# Patient Record
Sex: Female | Born: 1960 | Race: Black or African American | Hispanic: No | Marital: Single | Smoking: Former smoker
Health system: Southern US, Community
[De-identification: ages and names within clinical notes are randomized; demographics above are authoritative.]

## PROBLEM LIST (undated history)

## (undated) DIAGNOSIS — F419 Anxiety disorder, unspecified: Secondary | ICD-10-CM

## (undated) DIAGNOSIS — F329 Major depressive disorder, single episode, unspecified: Secondary | ICD-10-CM

## (undated) DIAGNOSIS — L309 Dermatitis, unspecified: Secondary | ICD-10-CM

## (undated) DIAGNOSIS — E785 Hyperlipidemia, unspecified: Secondary | ICD-10-CM

## (undated) DIAGNOSIS — R87810 Cervical high risk human papillomavirus (HPV) DNA test positive: Secondary | ICD-10-CM

## (undated) DIAGNOSIS — T7840XA Allergy, unspecified, initial encounter: Secondary | ICD-10-CM

## (undated) DIAGNOSIS — N898 Other specified noninflammatory disorders of vagina: Principal | ICD-10-CM

## (undated) DIAGNOSIS — I10 Essential (primary) hypertension: Secondary | ICD-10-CM

## (undated) DIAGNOSIS — B379 Candidiasis, unspecified: Secondary | ICD-10-CM

## (undated) DIAGNOSIS — F70 Mild intellectual disabilities: Secondary | ICD-10-CM

## (undated) DIAGNOSIS — K219 Gastro-esophageal reflux disease without esophagitis: Secondary | ICD-10-CM

## (undated) DIAGNOSIS — N950 Postmenopausal bleeding: Principal | ICD-10-CM

## (undated) DIAGNOSIS — N951 Menopausal and female climacteric states: Secondary | ICD-10-CM

## (undated) DIAGNOSIS — L509 Urticaria, unspecified: Secondary | ICD-10-CM

## (undated) DIAGNOSIS — R232 Flushing: Principal | ICD-10-CM

## (undated) DIAGNOSIS — F32A Depression, unspecified: Secondary | ICD-10-CM

## (undated) HISTORY — DX: Candidiasis, unspecified: B37.9

## (undated) HISTORY — DX: Major depressive disorder, single episode, unspecified: F32.9

## (undated) HISTORY — DX: Cervical high risk human papillomavirus (HPV) DNA test positive: R87.810

## (undated) HISTORY — DX: Flushing: R23.2

## (undated) HISTORY — DX: Dermatitis, unspecified: L30.9

## (undated) HISTORY — DX: Hyperlipidemia, unspecified: E78.5

## (undated) HISTORY — DX: Anxiety disorder, unspecified: F41.9

## (undated) HISTORY — DX: Postmenopausal bleeding: N95.0

## (undated) HISTORY — PX: NO PAST SURGERIES: SHX2092

## (undated) HISTORY — DX: Gastro-esophageal reflux disease without esophagitis: K21.9

## (undated) HISTORY — DX: Depression, unspecified: F32.A

## (undated) HISTORY — DX: Urticaria, unspecified: L50.9

## (undated) HISTORY — DX: Menopausal and female climacteric states: N95.1

## (undated) HISTORY — DX: Allergy, unspecified, initial encounter: T78.40XA

## (undated) HISTORY — DX: Other specified noninflammatory disorders of vagina: N89.8

---

## 2003-02-23 ENCOUNTER — Ambulatory Visit (HOSPITAL_COMMUNITY): Admission: RE | Admit: 2003-02-23 | Discharge: 2003-02-23 | Payer: Self-pay | Admitting: Family Medicine

## 2003-02-23 ENCOUNTER — Encounter: Payer: Self-pay | Admitting: Family Medicine

## 2004-11-28 ENCOUNTER — Ambulatory Visit: Payer: Self-pay | Admitting: Family Medicine

## 2004-12-19 ENCOUNTER — Ambulatory Visit: Payer: Self-pay | Admitting: Family Medicine

## 2004-12-20 ENCOUNTER — Ambulatory Visit (HOSPITAL_COMMUNITY): Admission: RE | Admit: 2004-12-20 | Discharge: 2004-12-20 | Payer: Self-pay | Admitting: Family Medicine

## 2005-03-14 ENCOUNTER — Ambulatory Visit (HOSPITAL_COMMUNITY): Admission: RE | Admit: 2005-03-14 | Discharge: 2005-03-14 | Payer: Self-pay | Admitting: Specialist

## 2005-03-16 ENCOUNTER — Ambulatory Visit: Payer: Self-pay | Admitting: Family Medicine

## 2005-06-19 ENCOUNTER — Ambulatory Visit: Payer: Self-pay | Admitting: Family Medicine

## 2005-08-20 ENCOUNTER — Ambulatory Visit: Payer: Self-pay | Admitting: Family Medicine

## 2005-10-16 ENCOUNTER — Ambulatory Visit: Payer: Self-pay | Admitting: Family Medicine

## 2005-12-25 ENCOUNTER — Ambulatory Visit: Payer: Self-pay | Admitting: Family Medicine

## 2006-03-26 ENCOUNTER — Ambulatory Visit (HOSPITAL_COMMUNITY): Admission: RE | Admit: 2006-03-26 | Discharge: 2006-03-26 | Payer: Self-pay | Admitting: Family Medicine

## 2006-04-29 ENCOUNTER — Ambulatory Visit: Payer: Self-pay | Admitting: Family Medicine

## 2006-07-31 ENCOUNTER — Ambulatory Visit: Payer: Self-pay | Admitting: Family Medicine

## 2007-05-01 ENCOUNTER — Ambulatory Visit (HOSPITAL_COMMUNITY): Admission: RE | Admit: 2007-05-01 | Discharge: 2007-05-01 | Payer: Self-pay | Admitting: Family Medicine

## 2007-08-18 ENCOUNTER — Ambulatory Visit: Payer: Self-pay | Admitting: Family Medicine

## 2007-09-30 ENCOUNTER — Ambulatory Visit: Payer: Self-pay | Admitting: Family Medicine

## 2007-10-09 ENCOUNTER — Encounter: Payer: Self-pay | Admitting: Family Medicine

## 2007-12-04 ENCOUNTER — Ambulatory Visit: Payer: Self-pay | Admitting: Family Medicine

## 2008-01-22 ENCOUNTER — Ambulatory Visit: Payer: Self-pay | Admitting: Family Medicine

## 2008-04-26 ENCOUNTER — Ambulatory Visit: Payer: Self-pay | Admitting: Family Medicine

## 2008-08-27 ENCOUNTER — Ambulatory Visit: Payer: Self-pay | Admitting: Family Medicine

## 2008-11-18 ENCOUNTER — Ambulatory Visit: Payer: Self-pay | Admitting: Family Medicine

## 2008-11-18 ENCOUNTER — Other Ambulatory Visit: Admission: RE | Admit: 2008-11-18 | Discharge: 2008-11-18 | Payer: Self-pay | Admitting: Family Medicine

## 2008-11-18 ENCOUNTER — Encounter: Payer: Self-pay | Admitting: Family Medicine

## 2009-02-15 ENCOUNTER — Ambulatory Visit: Payer: Self-pay | Admitting: Family Medicine

## 2009-03-29 ENCOUNTER — Ambulatory Visit: Payer: Self-pay | Admitting: Family Medicine

## 2009-05-12 ENCOUNTER — Ambulatory Visit: Payer: Self-pay | Admitting: Family Medicine

## 2009-06-06 ENCOUNTER — Ambulatory Visit (HOSPITAL_COMMUNITY): Admission: RE | Admit: 2009-06-06 | Discharge: 2009-06-06 | Payer: Self-pay | Admitting: Family Medicine

## 2009-06-27 ENCOUNTER — Other Ambulatory Visit: Admission: RE | Admit: 2009-06-27 | Discharge: 2009-06-27 | Payer: Self-pay | Admitting: Obstetrics and Gynecology

## 2009-08-29 ENCOUNTER — Ambulatory Visit: Payer: Self-pay | Admitting: Family Medicine

## 2009-11-21 ENCOUNTER — Ambulatory Visit: Payer: Self-pay | Admitting: Family Medicine

## 2010-01-24 ENCOUNTER — Encounter: Payer: Self-pay | Admitting: Family Medicine

## 2010-02-02 ENCOUNTER — Encounter: Payer: Self-pay | Admitting: Physician Assistant

## 2010-02-02 ENCOUNTER — Telehealth: Payer: Self-pay | Admitting: Family Medicine

## 2010-02-02 ENCOUNTER — Ambulatory Visit: Payer: Self-pay | Admitting: Family Medicine

## 2010-02-02 DIAGNOSIS — J019 Acute sinusitis, unspecified: Secondary | ICD-10-CM

## 2010-02-10 ENCOUNTER — Telehealth: Payer: Self-pay | Admitting: Family Medicine

## 2010-02-13 ENCOUNTER — Ambulatory Visit: Payer: Self-pay | Admitting: Family Medicine

## 2010-02-13 ENCOUNTER — Telehealth (INDEPENDENT_AMBULATORY_CARE_PROVIDER_SITE_OTHER): Payer: Self-pay | Admitting: *Deleted

## 2010-02-13 DIAGNOSIS — K219 Gastro-esophageal reflux disease without esophagitis: Secondary | ICD-10-CM

## 2010-04-18 ENCOUNTER — Ambulatory Visit: Payer: Self-pay | Admitting: Family Medicine

## 2010-04-18 DIAGNOSIS — R5381 Other malaise: Secondary | ICD-10-CM | POA: Insufficient documentation

## 2010-04-18 DIAGNOSIS — R5383 Other fatigue: Secondary | ICD-10-CM

## 2010-04-18 DIAGNOSIS — R7301 Impaired fasting glucose: Secondary | ICD-10-CM | POA: Insufficient documentation

## 2010-04-18 DIAGNOSIS — E785 Hyperlipidemia, unspecified: Secondary | ICD-10-CM

## 2010-04-18 DIAGNOSIS — E8881 Metabolic syndrome: Secondary | ICD-10-CM

## 2010-06-06 ENCOUNTER — Telehealth: Payer: Self-pay | Admitting: Family Medicine

## 2010-06-21 ENCOUNTER — Ambulatory Visit: Payer: Self-pay | Admitting: Family Medicine

## 2010-06-29 ENCOUNTER — Telehealth: Payer: Self-pay | Admitting: Family Medicine

## 2010-06-29 LAB — CONVERTED CEMR LAB
AST: 16 units/L (ref 0–37)
Albumin: 4.2 g/dL (ref 3.5–5.2)
Alkaline Phosphatase: 83 units/L (ref 39–117)
Calcium: 9.5 mg/dL (ref 8.4–10.5)
Chloride: 107 meq/L (ref 96–112)
Creatinine, Ser: 0.85 mg/dL (ref 0.40–1.20)
HDL: 44 mg/dL (ref >39)
Hgb A1c MFr Bld: 5.5 % (ref <5.7)
Sodium: 138 meq/L (ref 135–145)
Total CHOL/HDL Ratio: 3
Total Protein: 7.4 g/dL (ref 6.0–8.3)
Triglycerides: 151 mg/dL — ABNORMAL HIGH (ref <150)

## 2010-07-06 ENCOUNTER — Other Ambulatory Visit: Admission: RE | Admit: 2010-07-06 | Discharge: 2010-07-06 | Payer: Self-pay | Admitting: Obstetrics and Gynecology

## 2010-07-06 ENCOUNTER — Encounter: Payer: Self-pay | Admitting: Family Medicine

## 2010-08-10 ENCOUNTER — Telehealth: Payer: Self-pay | Admitting: Family Medicine

## 2010-08-22 ENCOUNTER — Telehealth: Payer: Self-pay | Admitting: Family Medicine

## 2010-08-24 ENCOUNTER — Ambulatory Visit: Payer: Self-pay | Admitting: Family Medicine

## 2010-08-24 ENCOUNTER — Telehealth: Payer: Self-pay | Admitting: Family Medicine

## 2010-08-24 DIAGNOSIS — L259 Unspecified contact dermatitis, unspecified cause: Secondary | ICD-10-CM | POA: Insufficient documentation

## 2010-08-29 ENCOUNTER — Telehealth: Payer: Self-pay | Admitting: Family Medicine

## 2010-08-30 LAB — CONVERTED CEMR LAB
Basophils Absolute: 0 10*3/uL (ref 0.0–0.1)
Basophils Relative: 0 % (ref 0–1)
Hgb A1c MFr Bld: 5.4 % (ref <5.7)
MCHC: 32.7 g/dL (ref 30.0–36.0)
Monocytes Absolute: 0.4 10*3/uL (ref 0.1–1.0)
Neutro Abs: 4.3 10*3/uL (ref 1.7–7.7)
Neutrophils Relative %: 55 % (ref 43–77)
Platelets: 288 10*3/uL (ref 150–400)
RDW: 14.9 % (ref 11.5–15.5)
TSH: 1.598 microintl units/mL (ref 0.350–4.500)

## 2010-09-13 ENCOUNTER — Telehealth: Payer: Self-pay | Admitting: Family Medicine

## 2010-09-15 ENCOUNTER — Telehealth: Payer: Self-pay | Admitting: Family Medicine

## 2010-09-18 ENCOUNTER — Telehealth (INDEPENDENT_AMBULATORY_CARE_PROVIDER_SITE_OTHER): Payer: Self-pay | Admitting: *Deleted

## 2010-09-19 ENCOUNTER — Telehealth: Payer: Self-pay | Admitting: Family Medicine

## 2010-09-25 ENCOUNTER — Telehealth: Payer: Self-pay | Admitting: Family Medicine

## 2010-09-26 ENCOUNTER — Encounter: Payer: Self-pay | Admitting: Family Medicine

## 2010-09-26 ENCOUNTER — Telehealth (INDEPENDENT_AMBULATORY_CARE_PROVIDER_SITE_OTHER): Payer: Self-pay | Admitting: *Deleted

## 2010-09-27 ENCOUNTER — Ambulatory Visit: Payer: Self-pay | Admitting: Family Medicine

## 2010-10-10 ENCOUNTER — Telehealth: Payer: Self-pay | Admitting: Family Medicine

## 2010-10-11 ENCOUNTER — Telehealth: Payer: Self-pay | Admitting: Family Medicine

## 2010-10-12 ENCOUNTER — Telehealth: Payer: Self-pay | Admitting: Family Medicine

## 2010-10-16 ENCOUNTER — Ambulatory Visit (HOSPITAL_COMMUNITY): Admission: RE | Admit: 2010-10-16 | Payer: Self-pay | Source: Home / Self Care | Admitting: Family Medicine

## 2010-10-16 ENCOUNTER — Telehealth: Payer: Self-pay | Admitting: Family Medicine

## 2010-10-23 ENCOUNTER — Telehealth: Payer: Self-pay | Admitting: Family Medicine

## 2010-10-28 ENCOUNTER — Encounter: Payer: Self-pay | Admitting: Family Medicine

## 2010-10-29 ENCOUNTER — Encounter: Payer: Self-pay | Admitting: Family Medicine

## 2010-10-30 ENCOUNTER — Telehealth: Payer: Self-pay | Admitting: Family Medicine

## 2010-11-07 NOTE — Assessment & Plan Note (Signed)
Summary: office visit   Vital Signs:  Patient profile:   50 year old female Height:      66 inches Weight:      278.75 pounds BMI:     45.15 O2 Sat:      94 % Pulse rate:   100 / minute Pulse rhythm:   regular Resp:     16 per minute BP sitting:   110 / 78  (left arm) Cuff size:   xl  Vitals Entered By: Everitt Amber LPN (April 18, 2010 9:09 AM)  Nutrition Counseling: Patient's BMI is greater than 25 and therefore counseled on weight management options. CC: Follow up chronic problems   CC:  Follow up chronic problems.  History of Present Illness: Reports  that she has not been doing very well. She states that she is feeling "pressured " by her family about her eating, she is not able to do the right thing under pressure, and is ditressed aboput her significant weight gain. Denies recent fever or chills. Denies sinus pressure, nasal congestion , ear pain or sore throat. Denies chest congestion, or cough productive of sputum. Denies chest pain, palpitations, PND, orthopnea or leg swelling. Denies abdominal pain, nausea, vomitting, diarrhea or constipation. Denies change in bowel movements or bloody stool. Denies dysuria , frequency, incontinence or hesitancy. Denies  joint pain, swelling, or reduced mobility. Denies headaches, vertigo, seizures. reports increased depression and anxiety.She denies suicidal or homicidal thoughts, and denies hallucinations.  Denies  rash, lesions, or itch.     Current Medications (verified): 1)  Omeprazole 20 Mg Cpdr (Omeprazole) .... Take 1 Daily 2)  Pravastatin Sodium 20 Mg Tabs (Pravastatin Sodium) .... Take 1 Tab By Mouth At Bedtime 3)  Alprazolam 1 Mg Tabs (Alprazolam) .... Take 1 Tablet By Mouth Two Times A Day 4)  Cetirizine Hcl 10 Mg Tabs (Cetirizine Hcl) .... Take 1 Tablet By Mouth Once A Day 5)  Seroquel Xr 300 Mg Xr24h-Tab (Quetiapine Fumarate) .... Take 1 Tablet By Mouth Once A Day 6)  Cyproheptadine Hcl 4 Mg Tabs (Cyproheptadine  Hcl) .... Take 1 Tablet By Mouth Once A Day 7)  Paroxetine Hcl 40 Mg Tabs (Paroxetine Hcl) .... Take 1 Tablet By Mouth Every Morning 8)  Ortho Tri-Cyclen Lo 0.18/0.215/0.25 Mg-25 Mcg Tabs (Norgestim-Eth Estrad Triphasic) .... Take 1 Tablet By Mouth Once A Day 9)  Loratadine 10 Mg Tabs (Loratadine) .... Take 1 Tablet By Mouth Once A Day  Allergies (verified): No Known Drug Allergies  Review of Systems      See HPI Eyes:  Denies blurring and discharge. Endo:  Complains of excessive hunger; denies excessive urination, heat intolerance, and polyuria. Heme:  Denies abnormal bruising and bleeding. Allergy:  Denies hives or rash and itching eyes.  Physical Exam  General:  Well-developed,obese,in no acute distress; alert,appropriate and cooperative throughout examination HEENT: No facial asymmetry,  EOMI, No sinus tenderness, TM's Clear, oropharynx  pink and moist.   Chest: Clear to auscultation bilaterally.  CVS: S1, S2, No murmurs, No S3.   Abd: Soft, Nontender.  MS: Adequate ROM spine, hips, shoulders and knees.  Ext: No edema.   CNS: CN 2-12 intact, power tone and sensation normal throughout.   Skin: Intact, no visible lesions or rashes.  Psych: Good eye contact, normal affect.  Memory intact, anxious  appearing.    Impression & Recommendations:  Problem # 1:  OBESITY (ICD-278.00) Assessment Deteriorated pt advised to commit to regular exrcise , and to reduce high carb snacks,  a letter was also sent to her family  Problem # 2:  HYPERLIPIDEMIA (ICD-272.4) Assessment: Comment Only  Her updated medication list for this problem includes:    Pravastatin Sodium 20 Mg Tabs (Pravastatin sodium) .Marland Kitchen... Take 1 tab by mouth at bedtime  Orders: T-Hepatic Function [phone]) T-Lipid Profile [phone])  lab to be reptsd when due  Problem # 3:  GERD (ICD-530.81) Assessment: Improved  Her updated medication list for this problem includes:    Omeprazole 20 Mg Cpdr (Omeprazole)  .Marland Kitchen... Take 1 daily  Complete Medication List: 1)  Omeprazole 20 Mg Cpdr (Omeprazole) .... Take 1 daily 2)  Pravastatin Sodium 20 Mg Tabs (Pravastatin sodium) .... Take 1 tab by mouth at bedtime 3)  Alprazolam 1 Mg Tabs (Alprazolam) .... Take 1 tablet by mouth two times a day 4)  Cetirizine Hcl 10 Mg Tabs (Cetirizine hcl) .... Take 1 tablet by mouth once a day 5)  Seroquel Xr 300 Mg Xr24h-tab (Quetiapine fumarate) .... Take 1 tablet by mouth once a day 6)  Cyproheptadine Hcl 4 Mg Tabs (Cyproheptadine hcl) .... Take 1 tablet by mouth once a day 7)  Paroxetine Hcl 40 Mg Tabs (Paroxetine hcl) .... Take 1 tablet by mouth every morning 8)  Ortho Tri-cyclen Lo 0.18/0.215/0.25 Mg-25 Mcg Tabs (Norgestim-eth estrad triphasic) .... Take 1 tablet by mouth once a day 9)  Loratadine 10 Mg Tabs (Loratadine) .... Take 1 tablet by mouth once a day  Other Orders: T-Basic Metabolic Panel [id number]) T- Hemoglobin A1C ([id number])  Patient Instructions: 1)  Please schedule a follow-up appointment in 2 months. 2)  It is important that you exercise regularly at least30 minutes 5 times a week. If you develop chest pain, have severe difficulty breathing, or feel very tired , stop exercising immediately and seek medical attention. 3)  You need to lose weight. Consider a lower calorie diet and regular exercise.  4)  BMP prior to visit, ICD-9: 5)  Hepatic Panel prior to visit, ICD-9:  fasting in 2 months 6)  Lipid Panel prior to visit, ICD-9: 7)  HbgA1C prior to visit, ICD-9:

## 2010-11-07 NOTE — Letter (Signed)
Summary: family tree  family tree   Imported By: Lind Guest 07/12/2010 12:56:06  _____________________________________________________________________  External Attachment:    Type:   Image     Comment:   External Document

## 2010-11-07 NOTE — Letter (Signed)
Summary: office notes  office notes   Imported By: Lind Guest 03/10/2010 16:54:32  _____________________________________________________________________  External Attachment:    Type:   Image     Comment:   External Document

## 2010-11-07 NOTE — Assessment & Plan Note (Signed)
Summary: rash per phone note patient to come in/slj   Vital Signs:  Patient profile:   50 year old female Height:      66 inches Weight:      240 pounds O2 Sat:      97 % on Room air Pulse rate:   93 / minute Pulse rhythm:   regular Resp:     16 per minute BP sitting:   122 / 84  (left arm)  O2 Flow:  Room air CC: rasg   CC:  rasg.  History of Present Illness: Reports  that tsh has been doing well , except for a generalised rash which she specifically would like treated. She has been diligent with both diet and reduced intake with resultant weight loss Denies sinus pressure, nasal congestion , ear pain or sore throat. Denies chest congestion, or cough productive of sputum. Denies chest pain, palpitations, PND, orthopnea or leg swelling. Denies abdominal pain, nausea, vomitting, diarrhea or constipation. Denies change in bowel movements or bloody stool. Denies dysuria , frequency, incontinence or hesitancy. Denies  joint pain, swelling, or reduced mobility. Denies headaches, vertigo, seizures. Denies depression, anxiety or insomnia.      Allergies: No Known Drug Allergies  Review of Systems      See HPI General:  Complains of weight loss; intentionmalthrough lifestyle change and med asistance. Eyes:  Denies blurring and discharge. Psych:  Complains of anxiety and mental problems; denies depression and suicidal thoughts/plans. Endo:  Denies cold intolerance, excessive hunger, excessive thirst, and excessive urination. Heme:  Denies abnormal bruising and bleeding. Allergy:  Complains of hives or rash and seasonal allergies.  Physical Exam  General:  Well-developedobese,in no acute distress; alert,appropriate and cooperative throughout examination HEENT: No facial asymmetry,  EOMI, No sinus tenderness, TM's Clear, oropharynx  pink and moist.   Chest: Clear to auscultation bilaterally.  CVS: S1, S2, No murmurs, No S3.   Abd: Soft, Nontender.  MS: Adequate ROM spine,  hips, shoulders and knees.  Ext: No edema.   CNS: CN 2-12 intact, power tone and sensation normal throughout.   Skin:maculopapular rash on upper extremeties and trunk Psych: Good eye contact, normal affect.  Memory intact, not anxious or depressed appearing.    Impression & Recommendations:  Problem # 1:  DERMATITIS, ALLERGIC (ICD-692.9) Assessment Deteriorated  Her updated medication list for this problem includes:    Cetirizine Hcl 10 Mg Tabs (Cetirizine hcl) .Marland Kitchen... Take 1 tablet by mouth once a day    Cyproheptadine Hcl 4 Mg Tabs (Cyproheptadine hcl) .Marland Kitchen... Take 1 tablet by mouth once a day    Loratadine 10 Mg Tabs (Loratadine) .Marland Kitchen... Take 1 tablet by mouth once a day    Hydrocortisone 0.5 % Crea (Hydrocortisone) .Marland Kitchen... Apply sparingly to rash once daily as needed  Orders: Depo- Medrol 80mg  (J1040) Admin of Therapeutic Inj  intramuscular or subcutaneous (16109)  Problem # 2:  OBESITY (ICD-278.00) Assessment: Improved  Ht: 66 (08/24/2010)   Wt: 240 (08/24/2010)   BMI: 42.97 (06/21/2010) therapeutic lifestyle change discussed and encouraged  Problem # 3:  HYPERLIPIDEMIA (ICD-272.4) Assessment: Comment Only  Her updated medication list for this problem includes:    Pravastatin Sodium 20 Mg Tabs (Pravastatin sodium) .Marland Kitchen... Take 1 tab by mouth at bedtime  Labs Reviewed: SGOT: 16 (06/21/2010)   SGPT: 11 (06/21/2010)   HDL:44 (06/21/2010)  LDL:58 (06/21/2010)  Chol:132 (06/21/2010)  Trig:151 (06/21/2010)  Complete Medication List: 1)  Omeprazole 20 Mg Cpdr (Omeprazole) .... Take 1 daily 2)  Pravastatin Sodium 20 Mg Tabs (Pravastatin sodium) .... Take 1 tab by mouth at bedtime 3)  Alprazolam 1 Mg Tabs (Alprazolam) .... Take 1 tablet by mouth two times a day 4)  Cetirizine Hcl 10 Mg Tabs (Cetirizine hcl) .... Take 1 tablet by mouth once a day 5)  Cyproheptadine Hcl 4 Mg Tabs (Cyproheptadine hcl) .... Take 1 tablet by mouth once a day 6)  Paroxetine Hcl 40 Mg Tabs (Paroxetine hcl)  .... Take 1 tablet by mouth every morning 7)  Ortho Tri-cyclen Lo 0.18/0.215/0.25 Mg-25 Mcg Tabs (Norgestim-eth estrad triphasic) .... Take 1 tablet by mouth once a day 8)  Loratadine 10 Mg Tabs (Loratadine) .... Take 1 tablet by mouth once a day 9)  Phentermine Hcl 37.5 Mg Tabs (Phentermine hcl) .... Take one half  tablet by mouth once a day 10)  Hydrocortisone 0.5 % Crea (Hydrocortisone) .... Apply sparingly to rash once daily as needed Prescriptions: PAROXETINE HCL 40 MG TABS (PAROXETINE HCL) Take 1 tablet by mouth every morning  #30 x 3   Entered by:   Adella Hare LPN   Authorized by:   Syliva Overman MD   Signed by:   Adella Hare LPN on 16/07/9603   Method used:   Electronically to        [facility]* (retail)       [address] [place], [place]  [address]       Ph: [phone]       Fax: [phone]   RxID:   [id number] CYPROHEPTADINE HCL 4 MG TABS (CYPROHEPTADINE HCL) Take 1 tablet by mouth once a day  #30 x 3   Entered by:   Adella Hare LPN   Authorized by:   Syliva Overman MD   Signed by:   Adella Hare LPN on 08/04/2535   Method used:   Electronically to        [facility]* (retail)       [address] [place], [place]  [address]       Ph: [phone]       Fax: [phone]   RxID:   [id number] CETIRIZINE HCL 10 MG TABS (CETIRIZINE HCL) Take 1 tablet by mouth once a day  #30 x 3   Entered by:   Adella Hare LPN   Authorized by:   Syliva Overman MD   Signed by:   Adella Hare LPN on 32/35/5732   Method used:   Electronically to        [facility]* (retail)       [address]       [place], [place]  [address]       Ph: [phone]       Fax: [phone]   RxID:   [id number] PRAVASTATIN SODIUM 20 MG TABS (PRAVASTATIN SODIUM) Take 1 tab by mouth at bedtime  #30 x 3   Entered by:   Adella Hare LPN   Authorized by:   Syliva Overman MD   Signed by:   Adella Hare LPN on 35/00/9381   Method used:   Electronically to        [facility]* (retail)       [address]  [place], [place]  [address]       Ph: [phone]       Fax: [phone]   RxID:   [id number] OMEPRAZOLE 20 MG CPDR (OMEPRAZOLE) take 1 daily  #30 x 3   Entered by:   Adella Hare LPN   Authorized by:   Syliva Overman MD   Signed by:   Adella Hare LPN on 40/07/2724   Method used:   Electronically to        [facility]* (retail)       [address]       [place], [place]  [address]       Ph: [phone]       Fax: [phone]   RxID:   [id number]    Medication Administration  Injection # 1:    Medication: Depo- Medrol 80mg     Diagnosis: DERMATITIS, ALLERGIC (ICD-692.9)    Route: IM    Site: R deltoid    Exp Date: 06/12    Lot #: [id number]    Mfr: Pharmacia    Patient tolerated injection without complications    Given by: Adella Hare LPN (August 24, 2010 11:24 AM)  Orders Added: 1)  Depo- Medrol 80mg  [J1040] 2)  Admin of Therapeutic Inj  intramuscular or subcutaneous [96372] 3)  Est. Patient Level IV [09323]     Medication Administration  Injection # 1:    Medication: Depo- Medrol 80mg     Diagnosis: DERMATITIS, ALLERGIC (ICD-692.9)    Route: IM    Site: R deltoid    Exp Date: 06/12    Lot #: [id number]    Mfr: Pharmacia    Patient tolerated injection without complications    Given by: Adella Hare LPN (August 24, 2010 11:24 AM)  Orders Added: 1)  Depo- Medrol 80mg  [J1040] 2)  Admin of Therapeutic Inj  intramuscular or subcutaneous [96372] 3)  Est. Patient Level IV [55732]

## 2010-11-07 NOTE — Progress Notes (Signed)
Summary: rx cough med  Phone Note Call from Patient   Summary of Call: coughing really bad and would like to get a rx for cough meds. [phone] Initial call taken by: Rudene Anda,  Feb 10, 2010 1:06 PM  Follow-up for Phone Call        cough with clear to yellow sputum advised robutussin otc  if develops fever or green sputum to go to er or urgent care Follow-up by: Adella Hare LPN,  Feb 10, 1609 4:16 PM

## 2010-11-07 NOTE — Progress Notes (Signed)
Summary: medicine  Phone Note Call from Patient   Summary of Call: said that [facility] does not have the medicine request Initial call taken by: Lind Guest,  February 02, 2010 1:16 PM  Follow-up for Phone Call        The med was entered but not signed. I sent the med to the pharmacy for her Follow-up by: Everitt Amber LPN,  February 02, 2010 1:39 PM

## 2010-11-07 NOTE — Progress Notes (Signed)
Summary: sweat  Phone Note Call from Patient   Summary of Call: patient called in states Dr. done blood work but she still  continue to sweat.. Initial call taken by: Eugenio Hoes,  September 13, 2010 11:16 AM  Follow-up for Phone Call        patient called back in and states that she called her pharmacy and they told her it might be coming from the medication Phentermen, he said that it had side effectB/P and heart rate and sweeting from this medication he advised patient to stop taking that medication.  alli, was over the counter medication he advised her of.   Follow-up by: Curtis Sites,  September 13, 2010 2:00 PM  Additional Follow-up for Phone Call Additional follow up Details #1::        let herknow the BP and the heart rate were good at [facility], she should try to stay off the phentermine if it is making her sweat excessively, if stopping helps her , then stop altogether, keep the diet and exercise going Additional Follow-up by: Syliva Overman MD,  September 14, 2010 12:58 PM    Additional Follow-up for Phone Call Additional follow up Details #2::    patient aware Follow-up by: Adella Hare LPN,  September 14, 2010 1:40 PM

## 2010-11-07 NOTE — Letter (Signed)
Summary: history and physical  history and physical   Imported By: Lind Guest 03/10/2010 16:55:27  _____________________________________________________________________  External Attachment:    Type:   Image     Comment:   External Document

## 2010-11-07 NOTE — Letter (Signed)
Summary: demo  demo   Imported By: Lind Guest 03/10/2010 16:56:55  _____________________________________________________________________  External Attachment:    Type:   Image     Comment:   External Document

## 2010-11-07 NOTE — Letter (Signed)
Summary: consults  consults   Imported By: Lind Guest 03/10/2010 16:52:50  _____________________________________________________________________  External Attachment:    Type:   Image     Comment:   External Document

## 2010-11-07 NOTE — Progress Notes (Signed)
Summary: diet pills  Phone Note Call from Patient   Complaint: Urinary/GYN Problems Summary of Call: pt would like to get soamething called into [facility] to curve her appetite. [phone] Initial call taken by: Rudene Anda,  June 06, 2010 11:56 AM  Follow-up for Phone Call        needs ov, also meds she is on cannot take apetite suppressants Follow-up by: Syliva Overman MD,  June 06, 2010 1:03 PM  Additional Follow-up for Phone Call Additional follow up Details #1::        patient aware Additional Follow-up by: Everitt Amber LPN,  June 06, 2010 1:19 PM

## 2010-11-07 NOTE — Progress Notes (Signed)
Summary: cough medicine  Phone Note Call from Patient   Summary of Call: wants some cough medicine called in at [facility].    to cut the cough off where it will dry it up she will stop coughing call her back at [phone] to let her know  Initial call taken by: Lind Guest,  Feb 13, 2010 8:52 AM  Follow-up for Phone Call        please have patient come in for office visit, already advised her friday if still coughing today would need appt Follow-up by: Adella Hare LPN,  Feb 13, 1609 8:56 AM  Additional Follow-up for Phone Call Additional follow up Details #1::        she has appt with dr. Lodema Hong on monday 5.16.11 Additional Follow-up by: Lind Guest,  Feb 13, 2010 9:40 AM

## 2010-11-07 NOTE — Progress Notes (Signed)
Summary: shot  Phone Note Call from Patient   Summary of Call: patient states her rash itches really bad, and she is putting some cream on it, she spoke to dr. Willa Rough nurse one day last week and they told her to ask for a shot.  she comes in on Thursday. Initial call taken by: Curtis Sites,  August 22, 2010 11:08 AM  Follow-up for Phone Call        advised patient we will see her thursday and may be able to give her shot at that time, advised to continue otc cream patient agrees Follow-up by: Adella Hare LPN,  August 22, 2010 11:14 AM

## 2010-11-07 NOTE — Letter (Signed)
Summary: phone notes  phone notes   Imported By: Lind Guest 03/13/2010 11:47:35  _____________________________________________________________________  External Attachment:    Type:   Image     Comment:   External Document

## 2010-11-07 NOTE — Letter (Signed)
Summary: x rays   x rays   Imported By: Lind Guest 03/10/2010 16:57:22  _____________________________________________________________________  External Attachment:    Type:   Image     Comment:   External Document

## 2010-11-07 NOTE — Progress Notes (Signed)
Summary: allergy and asthma  allergy and asthma   Imported By: Lind Guest 04/07/2010 14:38:52  _____________________________________________________________________  External Attachment:    Type:   Image     Comment:   External Document

## 2010-11-07 NOTE — Assessment & Plan Note (Signed)
Summary: office visit   Vital Signs:  Patient profile:   50 year old female Height:      66 inches Weight:      265.25 pounds BMI:     42.97 O2 Sat:      97 % Pulse rate:   106 / minute Pulse rhythm:   regular Resp:     16 per minute BP sitting:   118 / 82  (left arm) Cuff size:   xl  Vitals Entered By: Everitt Amber LPN (June 21, 2010 9:14 AM)  Nutrition Counseling: Patient's BMI is greater than 25 and therefore counseled on weight management options. CC: Follow up chronic problems   CC:  Follow up chronic problems.  History of Present Illness: Reports  that she has been doing well. She has stopped thwe seroquel is now on paxil and xanax. She has started walking and has changed her eating and has lost weight Denies recent fever or chills. Denies sinus pressure, nasal congestion , ear pain or sore throat. Denies chest congestion, or cough productive of sputum. Denies chest pain, palpitations, PND, orthopnea or leg swelling. Denies abdominal pain, nausea, vomitting, diarrhea or constipation. Denies change in bowel movements or bloody stool. Denies dysuria , frequency, incontinence or hesitancy. Denies  joint pain, swelling, or reduced mobility. Denies headaches, vertigo, seizures. Denies depression, anxiety or insomnia. Denies  rash, lesions, or itch. Pt has done extremely well with weight loss, and is again asking about suppressants, a call to the pharmacist verifies ok with her current meds. potential adverse s/E were discussed     Current Medications (verified): 1)  Omeprazole 20 Mg Cpdr (Omeprazole) .... Take 1 Daily 2)  Pravastatin Sodium 20 Mg Tabs (Pravastatin Sodium) .... Take 1 Tab By Mouth At Bedtime 3)  Alprazolam 1 Mg Tabs (Alprazolam) .... Take 1 Tablet By Mouth Two Times A Day 4)  Cetirizine Hcl 10 Mg Tabs (Cetirizine Hcl) .... Take 1 Tablet By Mouth Once A Day 5)  Cyproheptadine Hcl 4 Mg Tabs (Cyproheptadine Hcl) .... Take 1 Tablet By Mouth Once A  Day 6)  Paroxetine Hcl 40 Mg Tabs (Paroxetine Hcl) .... Take 1 Tablet By Mouth Every Morning 7)  Ortho Tri-Cyclen Lo 0.18/0.215/0.25 Mg-25 Mcg Tabs (Norgestim-Eth Estrad Triphasic) .... Take 1 Tablet By Mouth Once A Day 8)  Loratadine 10 Mg Tabs (Loratadine) .... Take 1 Tablet By Mouth Once A Day  Allergies (verified): No Known Drug Allergies  Review of Systems      See HPI Eyes:  Denies blurring and discharge. Endo:  Denies excessive thirst and excessive urination. Heme:  Denies abnormal bruising and bleeding. Allergy:  Denies hives or rash and itching eyes.  Physical Exam  General:  Well-developed,obese,in no acute distress; alert,appropriate and cooperative throughout examination HEENT: No facial asymmetry,  EOMI, No sinus tenderness, TM's Clear, oropharynx  pink and moist.   Chest: Clear to auscultation bilaterally.  CVS: S1, S2, No murmurs, No S3.   Abd: Soft, Nontender.  MS: Adequate ROM spine, hips, shoulders and knees.  Ext: No edema.   CNS: CN 2-12 intact, power tone and sensation normal throughout.   Skin: Intact, no visible lesions or rashes.  Psych: Good eye contact, normal affect.  Memory intact, not anxious or depressed appearing.    Impression & Recommendations:  Problem # 1:  OBESITY (ICD-278.00) Assessment Improved  Ht: 66 (06/21/2010)   Wt: 265.25 (06/21/2010)   BMI: 42.97 (06/21/2010) therapeutic lifestyle change discussed and encouraged  pt to start half  phentermine  Problem # 2:  HYPERLIPIDEMIA (ICD-272.4) Assessment: Comment Only  Her updated medication list for this problem includes:    Pravastatin Sodium 20 Mg Tabs (Pravastatin sodium) .Marland Kitchen... Take 1 tab by mouth at bedtime fasting labs to be checked  Complete Medication List: 1)  Omeprazole 20 Mg Cpdr (Omeprazole) .... Take 1 daily 2)  Pravastatin Sodium 20 Mg Tabs (Pravastatin sodium) .... Take 1 tab by mouth at bedtime 3)  Alprazolam 1 Mg Tabs (Alprazolam) .... Take 1 tablet by mouth two  times a day 4)  Cetirizine Hcl 10 Mg Tabs (Cetirizine hcl) .... Take 1 tablet by mouth once a day 5)  Cyproheptadine Hcl 4 Mg Tabs (Cyproheptadine hcl) .... Take 1 tablet by mouth once a day 6)  Paroxetine Hcl 40 Mg Tabs (Paroxetine hcl) .... Take 1 tablet by mouth every morning 7)  Ortho Tri-cyclen Lo 0.18/0.215/0.25 Mg-25 Mcg Tabs (Norgestim-eth estrad triphasic) .... Take 1 tablet by mouth once a day 8)  Loratadine 10 Mg Tabs (Loratadine) .... Take 1 tablet by mouth once a day 9)  Phentermine Hcl 37.5 Mg Tabs (Phentermine hcl) .... Take 1 tablet by mouth once a day (please  break in half and take in the morning)  Other Orders: Influenza Vaccine NON MCR (02774)  Patient Instructions: 1)  Please schedule a follow-up appointment in 2 months. 2)  It is important that you exercise regularly at least 20 minutes 5 times a week. If you develop chest pain, have severe difficulty breathing, or feel very tired , stop exercising immediately and seek medical attention. 3)  You need to lose weight. Consider a lower calorie diet and regular exercise.  4)  bREAK phentermine in half, and remeber if you develop any adverse side effects as discussed pls stop the med and call Prescriptions: PHENTERMINE HCL 37.5 MG TABS (PHENTERMINE HCL) Take 1 tablet by mouth once a day (please  break in half and take in the morning)  #30 x 0   Entered and Authorized by:   Syliva Overman MD   Signed by:   Syliva Overman MD on 06/21/2010   Method used:   Printed then faxed to ...         RxID:   [id number]    Influenza Vaccine    Vaccine Type: Fluvax Non-MCR    Site: right deltoid    Mfr: Novartis     Dose: 0.5 ml    Route: IM    Given by: Everitt Amber LPN    Exp. Date: 02/2011    Lot #: [id number]

## 2010-11-07 NOTE — Progress Notes (Signed)
Summary: rash  Phone Note Call from Patient   Summary of Call: rash arms and dr hicks is out and they wanted her  to call here see about getting a shot call back at [phone] Initial call taken by: Lind Guest,  August 10, 2010 11:59 AM  Follow-up for Phone Call        PATIENT HAS RASH ON HER ARMS X 1 WEEK, STATES ALLERGIC REACTION, DOESNT ITCH, NORMALLY SEES DR HICKS, SHE IS OUT OF OFFICE, PATIENT STATES SHE NEEDS A SHOT Follow-up by: Adella Hare LPN,  August 10, 2010 12:02 PM  Additional Follow-up for Phone Call Additional follow up Details #1::        needs appt next week here, I will need to see her , not an emergency, she may see dr Zandra Abts if she is available in the interim, pls let her know Additional Follow-up by: Syliva Overman MD,  August 10, 2010 12:34 PM    Additional Follow-up for Phone Call Additional follow up Details #2::    PATIENT AWARE Follow-up by: Lind Guest,  August 10, 2010 1:04 PM  thanks

## 2010-11-07 NOTE — Progress Notes (Signed)
       New/Updated Medications: PHENTERMINE HCL 37.5 MG TABS (PHENTERMINE HCL) Take one half  tablet by mouth once a day HYDROCORTISONE 0.5 % CREA (HYDROCORTISONE) apply sparingly to rash once daily as needed Prescriptions: HYDROCORTISONE 0.5 % CREA (HYDROCORTISONE) apply sparingly to rash once daily as needed  #45 gm x 0   Entered and Authorized by:   Syliva Overman MD   Signed by:   Syliva Overman MD on 08/24/2010   Method used:   Electronically to        [facility] (retail)       [address]       [place], [place]  [address]       Ph: [phone]       Fax: [phone]   RxID:   [id number] PHENTERMINE HCL 37.5 MG TABS (PHENTERMINE HCL) Take one half  tablet by mouth once a day  #30 x 0   Entered and Authorized by:   Syliva Overman MD   Signed by:   Syliva Overman MD on 08/24/2010   Method used:   Printed then faxed to ...       [facility] (retail)       [address]       [place], [place]  [address]       Ph: [phone]       Fax: [phone]   RxID:   [id number]

## 2010-11-07 NOTE — Progress Notes (Signed)
Summary: lab work  Phone Note Call from Patient   Summary of Call: PATIENT would like her lab work sent to [facility]. Initial call taken by: Curtis Sites,  August 29, 2010 3:06 PM  Follow-up for Phone Call        noted Follow-up by: Adella Hare LPN,  August 29, 2010 6:02 PM

## 2010-11-07 NOTE — Miscellaneous (Signed)
Summary: Orders Update  Clinical Lists Changes  Orders: Added new Test order of T-CBC w/Diff [phone]) - Signed Added new Test order of T-TSH [phone]) - Signed Added new Test order of T- Hemoglobin A1C ([id number]) - Signed

## 2010-11-07 NOTE — Progress Notes (Signed)
Summary: new allergy ashtma doctor  Phone Note Call from Patient   Summary of Call: patient would like to go to another allergy and asthma  doctor she wants a female she wants one here in [place] or [place], Doesn't want to see Dr. Willa Rough anymore she states Dr. Willa Rough doesn't believe in giving shots when patient has hives.  She doesn't want to see this doctor until March.Patient states Dr. Willa Rough doesn't up any time with the patient.  She wants a SHOT.. Initial call taken by: Curtis Sites,  August 22, 2010 1:38 PM  Follow-up for Phone Call        let her know there is no other female  allergy doc in [place] or [place], I suggest she discuss her concerns with Dr Willa Rough so she canm better understand her treatment plan  Follow-up by: Syliva Overman MD,  August 23, 2010 6:30 AM  Additional Follow-up for Phone Call Additional follow up Details #1::        patient is aware  she said that she will stay with dr hicks but when she comes in tomorrow she needs a shot Additional Follow-up by: Lind Guest,  August 23, 2010 11:13 AM

## 2010-11-07 NOTE — Progress Notes (Signed)
Summary: results of lab  Phone Note Call from Patient   Summary of Call: pt would like to get results for lab cell [phone] work [phone] Initial call taken by: Rudene Anda,  June 29, 2010 1:30 PM  Follow-up for Phone Call        called patient, left message Follow-up by: Adella Hare LPN,  June 29, 2010 1:44 PM

## 2010-11-07 NOTE — Assessment & Plan Note (Signed)
Summary: cough - room 1   Vital Signs:  Patient profile:   50 year old female Weight:      269 pounds O2 Sat:      97 % on Room air Pulse rate:   109 / minute Resp:     16 per minute BP sitting:   110 / 80  (left arm)  Vitals Entered By: Adella Hare LPN (Feb 13, 1609 10:58 AM) CC: cough Is Patient Diabetic? No Pain Assessment Patient in pain? no        CC:  cough.  History of Present Illness: Pt is here today with complains of a cough.  She was recently seen with nasal congestion syptoms and is finishing her Amoxicillin prescription.  She states that her head congestion is much better.  Denies PND.  But now c/o a cough.  The cough is nonproductive. She states that she is burping, and has discomfort that comes from lower sternal area (pt points to it) that comes up.  When she burps up stuff it is usually clear, but sometimes is yellow.  She denies increased burping, or indigestion when lying down. No worsening of cough at hs.  She denies prev hx of heartburn.  Is not taking any over the counter medications for cough.  Past History:  PMH was reviewed in pts other chart.  Information has not been transferred to this correct chart at time of visit.  Review of Systems General:  Denies chills and fever. ENT:  Denies earache, nasal congestion, postnasal drainage, sinus pressure, and sore throat. CV:  Denies chest pain or discomfort. Resp:  Complains of cough; denies shortness of breath, sputum productive, and wheezing. GI:  Denies abdominal pain and indigestion. Allergy:  Denies seasonal allergies and sneezing.  Physical Exam  General:  Well-developed,well-nourished,in no acute distress; alert,appropriate and cooperative throughout examination Head:  Normocephalic and atraumatic without obvious abnormalities. No apparent alopecia or balding. Ears:  External ear exam shows no significant lesions or deformities.  Otoscopic examination reveals clear canals, tympanic membranes are  intact bilaterally without bulging, retraction, inflammation or discharge. Hearing is grossly normal bilaterally. Nose:  External nasal examination shows no deformity or inflammation. Nasal mucosa are pink and moist without lesions or exudates. Mouth:  Oral mucosa and oropharynx without lesions or exudates.  Teeth in good repair. Neck:  No deformities, masses, or tenderness noted. Lungs:  Normal respiratory effort, chest expands symmetrically. Lungs are clear to auscultation, no crackles or wheezes. Heart:  Normal rate and regular rhythm. S1 and S2 normal without gallop, murmur, click, rub or other extra sounds. Abdomen:  soft, non-tender, no hepatomegaly, and no splenomegaly.   Cervical Nodes:  No lymphadenopathy noted Psych:  Cognition and judgment appear intact. Alert and cooperative with normal attention span and concentration. No apparent delusions, illusions, hallucinations   Impression & Recommendations:  Problem # 1:  COUGH (ICD-786.2) Assessment New Question if from recent URI infection vs GERD. Will treat  for both.  Problem # 2:  GERD (ICD-530.81) Assessment: New  Her updated medication list for this problem includes:    Omeprazole 20 Mg Cpdr (Omeprazole) .Marland Kitchen... Take 1 daily  Problem # 3:  OBESITY (ICD-278.00) Assessment: Deteriorated Discussed with pt need to increase her physical activity and improve diet. Low glycemic diet hand out given to help pt with food choices.  Wt: 269 (02/13/2010)     Complete Medication List: 1)  Zithromax Z-pak 250 Mg Tabs (Azithromycin) .... Take once daily as directed 2)  Tessalon Perles 100 Mg Caps (Benzonatate) .... Take 1-2 capsules every 8 hrs as needed for cough 3)  Omeprazole 20 Mg Cpdr (Omeprazole) .... Take 1 daily  Patient Instructions: 1)  Please schedule a follow-up appointment as needed. 2)  It is important that you exercise regularly at least 20 minutes 5 times a week. If you develop chest pain, have severe difficulty  breathing, or feel very tired , stop exercising immediately and seek medical attention. 3)  You need to lose weight. Consider a lower calorie diet and regular exercise.  4)  I have prescribed an antibiotic, a cough medicine, and a medication for your stomach to help with the burping that your doing. Prescriptions: OMEPRAZOLE 20 MG CPDR (OMEPRAZOLE) take 1 daily  #30 x 0   Entered and Authorized by:   Esperanza Sheets PA   Signed by:   Esperanza Sheets PA on 02/13/2010   Method used:   Electronically to        [facility] (retail)       [address]       [place], [place]  [address]       Ph: [phone]       Fax: [phone]   RxID:   [id number] TESSALON PERLES 100 MG CAPS (BENZONATATE) take 1-2 capsules every 8 hrs as needed for cough  #30 x 0   Entered and Authorized by:   Esperanza Sheets PA   Signed by:   Esperanza Sheets PA on 02/13/2010   Method used:   Electronically to        [facility] (retail)       [address]       [place], [place]  [address]       Ph: [phone]       Fax: [phone]   RxID:   [id number] ZITHROMAX Z-PAK 250 MG TABS (AZITHROMYCIN) take once daily as directed  #1 pack x 0   Entered and Authorized by:   Esperanza Sheets PA   Signed by:   Esperanza Sheets PA on 02/13/2010   Method used:   Electronically to        [facility] (retail)       [address]       [place], [place]  [address]       Ph: [phone]       Fax: [phone]   RxID:   [id number]

## 2010-11-07 NOTE — Letter (Signed)
Summary: labs  labs   Imported By: Lind Guest 03/10/2010 16:53:40  _____________________________________________________________________  External Attachment:    Type:   Image     Comment:   External Document

## 2010-11-07 NOTE — Assessment & Plan Note (Signed)
Summary: sick- room 3   Vital Signs:  Patient profile:   50 year old female Weight:      266.50 pounds O2 Sat:      97 % on Room air Pulse rate:   124 / minute BP sitting:   128 / 80  (left arm)  Vitals Entered By: Adella Hare LPN (February 02, 2010 9:52 AM) CC: cough, congestion, chills Is Patient Diabetic? No Pain Assessment Patient in pain? no        CC:  cough, congestion, and chills.  History of Present Illness: Pt is here today with c/o nasal congestion, sore throat, cough, yellow phlegm that started yesterday. Also HA.  Seems to be worsening. Yesterday was hot & sweaty, but no chills.  Tried Tylenol Sinus no help.  Cough is productive with yellow phlegm.    Past History:  Pt has 2 charts in error.  PMH, Problem List, Medications and allergies were reviewed  in other record.  This information will be transferred to this record by EMR team.  Physical Exam  General:  Well-developed,well-nourished,in no acute distress; alert,appropriate and cooperative throughout examination Head:  Normocephalic and atraumatic without obvious abnormalities. No apparent alopecia or balding. Ears:  External ear exam shows no significant lesions or deformities.  Otoscopic examination reveals clear canals, tympanic membranes are intact bilaterally without bulging, retraction, inflammation or discharge. Hearing is grossly normal bilaterally. Nose:  External nasal examination shows no deformity or inflammation. Nasal mucosa are pink and moist without lesions or exudates.L frontal sinus tenderness, L maxillary sinus tenderness, R frontal sinus tenderness, and R maxillary sinus tenderness.   Mouth:  Oral mucosa and oropharynx without lesions or exudates.  Teeth in good repair. Neck:  No deformities, masses, or tenderness noted. Lungs:  Normal respiratory effort, chest expands symmetrically. Lungs are clear to auscultation, no crackles or wheezes. Heart:  Normal rate and regular rhythm. S1 and S2  normal without gallop, murmur, click, rub or other extra sounds. Cervical Nodes:  No lymphadenopathy noted Psych:  Cognition and judgment appear intact. Alert and cooperative with normal attention span and concentration. No apparent delusions, illusions, hallucinations   Impression & Recommendations:  Problem # 1:  SINUSITIS, ACUTE (ICD-461.9) Assessment New  Her updated medication list for this problem includes:    Amoxicillin 875 Mg Tabs (Amoxicillin) .Marland Kitchen... Take 1 two times a day for 10 days  Orders: Depo- Medrol 80mg  (J1040) Admin of Therapeutic Inj  intramuscular or subcutaneous (45409)  Complete Medication List: 1)  Amoxicillin 875 Mg Tabs (Amoxicillin) .... Take 1 two times a day for 10 days  Patient Instructions: 1)  Keep your next appt. 2)  It is important that you exercise regularly at least 20 minutes 5 times a week. If you develop chest pain, have severe difficulty breathing, or feel very tired , stop exercising immediately and seek medical attention. 3)  You need to lose weight. Consider a lower calorie diet and regular exercise.  4)  Get plenty of rest, drink lots of clear liquids, and use Tylenol or Ibuprofen for fever and comfort. Return in 7-10 days if you're not better:sooner if you're feeling worse. 5)  I have prescribed an antibiotic for you.  Follow the directions on the pill bottle. 6)  You have received a shot of Depo Medrol today to help with your syptoms. Prescriptions: AMOXICILLIN 875 MG TABS (AMOXICILLIN) take 1 two times a day for 10 days  #20 x 0   Entered and Authorized by:   Alvis Lemmings  Garnette Czech PA   Signed by:   Everitt Amber LPN on 16/07/9603   Method used:   Electronically to        [facility] (retail)       [address]       [place], [place]  [address]       Ph: [phone]       Fax: [phone]   RxID:   [id number]    Medication Administration  Injection # 1:    Medication: Depo- Medrol 80mg      Diagnosis: SINUSITIS, ACUTE (ICD-461.9)    Route: IM    Site: R deltoid    Exp Date: 12/11    Lot #: [id number]    Mfr: Pharmacia    Patient tolerated injection without complications    Given by: Adella Hare LPN (February 02, 2010 10:19 AM)  Orders Added: 1)  Depo- Medrol 80mg  [J1040] 2)  Admin of Therapeutic Inj  intramuscular or subcutaneous [96372] 3)  Est. Patient Level III [10272]

## 2010-11-08 ENCOUNTER — Encounter: Payer: Self-pay | Admitting: Family Medicine

## 2010-11-09 NOTE — Progress Notes (Signed)
Summary: COUNCIL ON AGING / APPOINTMENT  Phone Note Call from Patient   Summary of Call: she needs to see dr this week for allergies aND RUNNY NOSE THE ONLY WAY COUNCIL ON AGING WILL BRING HER ON SHORT NOTICE IS THAT YOU FAX A STATEMENT SAYING THAT IT IS AN EMERGENCY SHE NEEDS TO COME PLEASE SEND TO [facility] AT FAX # [phone] NEEDS ASAP Initial call taken by: Lind Guest,  September 25, 2010 4:23 PM  Follow-up for Phone Call        pls send a staMPED NOTE SAYING SHE HAS APPT, THIS i DO NOT CONSIDER AN EMERGENCY PL S DO NOT WRITE THAT, IF WILL NOT WORK, PLS LET HER KNOW SHE WILL NEED TO  TR TO GET SOMEONE SHE NEEDS O HELP WITH A RIDE Follow-up by: Syliva Overman MD,  September 25, 2010 10:33 PM  Additional Follow-up for Phone Call Additional follow up Details #1::        SEND THE STATEMENT OVER THEN Laelia CALLED AND SAID THAT SHE DID NOT NEED TO COME Additional Follow-up by: Lind Guest,  September 26, 2010 9:04 AM    Additional Follow-up for Phone Call Additional follow up Details #2::    we will not go "this far again' espescial;ly when the request is not warranted, thanks Follow-up by: Syliva Overman MD,  September 26, 2010 12:39 PM

## 2010-11-09 NOTE — Progress Notes (Signed)
Summary: DIET PILL  Phone Note Call from Patient   Summary of Call: WANTS YOU TO CALL HER AT [phone] ABOUT DOES SHE NEED A REFILL ON HER DIET PILL SHE GETS THEM AT [facility] Initial call taken by: Lind Guest,  October 16, 2010 3:34 PM  Follow-up for Phone Call        can she have refill on phentermine?? Follow-up by: Everitt Amber LPN,  October 16, 2010 3:44 PM  Additional Follow-up for Phone Call Additional follow up Details #1::        please refill x 1, and let her know Additional Follow-up by: Syliva Overman MD,  October 16, 2010 5:06 PM    Additional Follow-up for Phone Call Additional follow up Details #2::    Refilled x 1 to [facility] Follow-up by: Everitt Amber LPN,  October 17, 2010 9:38 AM  New/Updated Medications: PHENTERMINE HCL 37.5 MG TABS (PHENTERMINE HCL) Take one half  tablet by mouth once a day Prescriptions: PHENTERMINE HCL 37.5 MG TABS (PHENTERMINE HCL) Take one half  tablet by mouth once a day  #30 x 0   Entered by:   Everitt Amber LPN   Authorized by:   Syliva Overman MD   Signed by:   Everitt Amber LPN on 14/78/2956   Method used:   Printed then faxed to ...       [facility] (retail)       [address]       [place], [place]  [address]       Ph: [phone]       Fax: [phone]   RxID:   [id number]

## 2010-11-09 NOTE — Letter (Signed)
Summary: Letter  Letter   Imported By: Lind Guest 09/26/2010 13:27:39  _____________________________________________________________________  External Attachment:    Type:   Image     Comment:   External Document

## 2010-11-09 NOTE — Progress Notes (Signed)
  Phone Note Call from Patient   Caller: Patient Summary of Call: patient states she stopped the phentermine and is still sweating Initial call taken by: Adella Hare LPN,  September 15, 2010 2:57 PM  Follow-up for Phone Call        let her know she may be going through the change, I have discussed this with her before, but since she sees gynae she can have them further investigate. Since even without the phentermine she is still sweating shematy resume if she wants to Follow-up by: Syliva Overman MD,  September 15, 2010 3:11 PM  Additional Follow-up for Phone Call Additional follow up Details #1::        patient aware Additional Follow-up by: Adella Hare LPN,  September 18, 2010 9:34 AM

## 2010-11-09 NOTE — Progress Notes (Signed)
Summary: diet pill  Phone Note Call from Patient   Summary of Call: needs for you to call her at [phone] about her diet pill and sweating Initial call taken by: Lind Guest,  October 11, 2010 11:27 AM  Follow-up for Phone Call        wanted to know if she should go ahead a take the phentermine   Follow-up by: Adella Hare LPN,  October 11, 2010 3:30 PM  Additional Follow-up for Phone Call Additional follow up Details #1::        if it is helping with her weight loss, and mo adverse siide effects, yes, I do not think that was the cause of her sweating,  Additional Follow-up by: Syliva Overman MD,  October 16, 2010 12:43 PM    Additional Follow-up for Phone Call Additional follow up Details #2::    Patient aware  Follow-up by: Everitt Amber LPN,  October 16, 2010 1:42 PM

## 2010-11-09 NOTE — Progress Notes (Signed)
Summary: diet pill  Phone Note Call from Patient   Summary of Call: pt wants to know is it safe to take black cohosh. and also wild yam.  diet pill [phone]  Initial call taken by: Rudene Anda,  October 23, 2010 4:03 PM  Follow-up for Phone Call        no studies done with these and her prescription meds so I do not recommend Follow-up by: Syliva Overman MD,  October 23, 2010 4:53 PM  Additional Follow-up for Phone Call Additional follow up Details #1::        called patient, left message Additional Follow-up by: Adella Hare LPN,  October 25, 2010 9:28 AM    Additional Follow-up for Phone Call Additional follow up Details #2::    called patient, left message Follow-up by: Adella Hare LPN,  October 26, 2010 10:57 AM

## 2010-11-09 NOTE — Progress Notes (Signed)
Summary: needs diet book and change in diet pills prescribed  Phone Note Call from Patient   Caller: Patient Reason for Call: Talk to Nurse Summary of Call: Patient called and wants Marijean Niemann to send her a diet book that coves proper meats, fruits, vegetables and desserts and how to prepare.  Diet pills she was prescribed are expensive and cause her to sweat too much - is there another, cheaper diet pill that will not make her feel bad.  She is also taking a Vit D pill.  Please mail the diet information to her home address.  Phone is currently charging so may not be available by phone until 01/06  [phone] Initial call taken by: Doneen Poisson  Follow-up for Phone Call        noted Follow-up by: Adella Hare LPN,  October 12, 2010 2:00 PM

## 2010-11-09 NOTE — Progress Notes (Signed)
Summary: wants you to call her  Phone Note Call from Patient   Summary of Call: left 2 messages call her at [phone] on her cellus phone and ask for Eira Initial call taken by: Lind Guest,  October 10, 2010 1:07 PM  Follow-up for Phone Call        returned call, left message Follow-up by: Adella Hare LPN,  October 10, 2010 3:52 PM  Additional Follow-up for Phone Call Additional follow up Details #1::        Phone Call Completed Additional Follow-up by: Adella Hare LPN,  October 11, 2010 3:26 PM

## 2010-11-09 NOTE — Progress Notes (Signed)
Summary: MEDICATION  Phone Note Call from Patient   Summary of Call: PATIENT CALLED WANTED TO KNOW  IF IT WAS OK  TO  GO BACK ON DIET PILLS, PLEASEE GIVE CALL PATIENT STATES THAT PLSE CALL HER ON CELL PHONE [phone] Initial call taken by: Eugenio Hoes,  September 18, 2010 10:13 AM  Follow-up for Phone Call        called back she wants to have lab drawn for her change of life or men. no sweating for 2 days  wants to have it drawn at [facility] when she does her mammo and to make sure the ins will pay for her to have labs drawn  call her back on her cellus phone at [phone] not her home # Follow-up by: Lind Guest,  September 18, 2010 11:35 AM  Additional Follow-up for Phone Call Additional follow up Details #1::        she needs to have [facility] order this test I have told her this multiple times, pls let her know again Additional Follow-up by: Syliva Overman MD,  September 18, 2010 3:08 PM    Additional Follow-up for Phone Call Additional follow up Details #2::    DR Victorino Dike GRIFFEN SHE WILL TALK TO HER Follow-up by: Lind Guest,  September 20, 2010 8:13 AM

## 2010-11-09 NOTE — Progress Notes (Signed)
Summary: WAITING FOR RETURN CALL  Phone Note Call from Patient   Summary of Call: PATIENT CALLED AGAIN WONDERING WHY KNOW ONE HAS RETURN HER CALL PLEASE CALL @ [phone] Initial call taken by: Eugenio Hoes,  September 19, 2010 11:33 AM  Follow-up for Phone Call        HAS CALLED AGIAN WANTING YOU TO RETURN HER CALL WHEN YOU COME BACK FROM [place] Follow-up by: Lind Guest,  September 19, 2010 1:12 PM  Additional Follow-up for Phone Call Additional follow up Details #1::        PATIENT CALLED AGAIN Additional Follow-up by: Lind Guest,  September 19, 2010 4:49 PM    Additional Follow-up for Phone Call Additional follow up Details #2::    luann spoke with  patient this morning Follow-up by: Adella Hare LPN,  September 20, 2010 9:28 AM

## 2010-11-09 NOTE — Progress Notes (Signed)
Summary: [facility]  Phone Note Call from Patient   Summary of Call: wants you to call [facility] 10am t0 700 pm about her at [phone] Initial call taken by: Lind Guest,  October 30, 2010 12:50 PM  Follow-up for Phone Call          this patient wants [place] to [facility] about info on wild yam and dhea.   even with info i cannot tell patient this is safe. Follow-up by: Adella Hare LPN,  October 30, 2010 1:28 PM  Additional Follow-up for Phone Call Additional follow up Details #1::        Since there is no link with tradfitional medicine and testing done on these products I will not call the facilioty, I follow recommendations from the scientific medical society based on testing pls explain Additional Follow-up by: Syliva Overman MD,  October 30, 2010 5:32 PM    Additional Follow-up for Phone Call Additional follow up Details #2::    patient aware and states she is taking the meds anyway Follow-up by: Adella Hare LPN,  November 01, 2010 11:06 AM

## 2010-11-09 NOTE — Progress Notes (Signed)
Summary: sinus medication  Phone Note Call from Patient   Summary of Call: Patient would like to know what she can take for sinus's she has cancelled the appt for Dec. 21, 2011.  Initial call taken by: Curtis Sites,  September 26, 2010 8:33 AM  Follow-up for Phone Call        she has 2 meds on her list she can take either loratidine or certrizine, pls let her know Follow-up by: Syliva Overman MD,  September 26, 2010 12:57 PM  Additional Follow-up for Phone Call Additional follow up Details #1::        certrizine is what patient is taking.  She said that her sinus were drying up.   Additional Follow-up by: Curtis Sites,  September 27, 2010 1:51 PM

## 2010-11-20 ENCOUNTER — Ambulatory Visit: Payer: Self-pay | Admitting: Family Medicine

## 2010-12-08 ENCOUNTER — Telehealth: Payer: Self-pay | Admitting: Family Medicine

## 2010-12-14 NOTE — Progress Notes (Signed)
  Phone Note Call from Patient   Summary of Call: Karen Cox called in and was talking about she was taken off her Xanax because it was making her sleepy and she has been feeling dreary? States she is not dizzy or light headed or anything but dreary, kind of unsteady slightly on her feet and she wanted Dr. Lodema Hong to know that she was off her xanax Initial call taken by: Everitt Amber LPN,  December 08, 2010 1:18 PM

## 2010-12-26 ENCOUNTER — Encounter: Payer: Self-pay | Admitting: Family Medicine

## 2010-12-27 ENCOUNTER — Encounter: Payer: Self-pay | Admitting: Family Medicine

## 2010-12-29 ENCOUNTER — Ambulatory Visit (INDEPENDENT_AMBULATORY_CARE_PROVIDER_SITE_OTHER): Payer: Medicaid Other | Admitting: Family Medicine

## 2010-12-29 ENCOUNTER — Ambulatory Visit: Payer: Self-pay | Admitting: Family Medicine

## 2010-12-29 ENCOUNTER — Encounter: Payer: Self-pay | Admitting: Family Medicine

## 2010-12-29 VITALS — BP 112/80 | HR 88 | Resp 16 | Ht 64.0 in | Wt 237.4 lb

## 2010-12-29 DIAGNOSIS — K219 Gastro-esophageal reflux disease without esophagitis: Secondary | ICD-10-CM

## 2010-12-29 DIAGNOSIS — R5383 Other fatigue: Secondary | ICD-10-CM

## 2010-12-29 DIAGNOSIS — F411 Generalized anxiety disorder: Secondary | ICD-10-CM

## 2010-12-29 DIAGNOSIS — F419 Anxiety disorder, unspecified: Secondary | ICD-10-CM

## 2010-12-29 DIAGNOSIS — R5381 Other malaise: Secondary | ICD-10-CM

## 2010-12-29 DIAGNOSIS — E669 Obesity, unspecified: Secondary | ICD-10-CM

## 2010-12-29 DIAGNOSIS — F32A Depression, unspecified: Secondary | ICD-10-CM

## 2010-12-29 DIAGNOSIS — F341 Dysthymic disorder: Secondary | ICD-10-CM

## 2010-12-29 DIAGNOSIS — J302 Other seasonal allergic rhinitis: Secondary | ICD-10-CM

## 2010-12-29 DIAGNOSIS — F329 Major depressive disorder, single episode, unspecified: Secondary | ICD-10-CM

## 2010-12-29 DIAGNOSIS — Z13820 Encounter for screening for osteoporosis: Secondary | ICD-10-CM

## 2010-12-29 DIAGNOSIS — F3289 Other specified depressive episodes: Secondary | ICD-10-CM

## 2010-12-29 DIAGNOSIS — Z13220 Encounter for screening for lipoid disorders: Secondary | ICD-10-CM

## 2010-12-29 DIAGNOSIS — E785 Hyperlipidemia, unspecified: Secondary | ICD-10-CM

## 2010-12-29 DIAGNOSIS — J309 Allergic rhinitis, unspecified: Secondary | ICD-10-CM

## 2010-12-29 MED ORDER — OMEPRAZOLE 10 MG PO CPDR: 10.0000 mg | DELAYED_RELEASE_CAPSULE | Freq: Every day | ORAL | Status: DC

## 2010-12-29 NOTE — Progress Notes (Signed)
  Subjective:    Patient ID: Karen Cox, female    DOB: Feb 17, 1961, 50 y.o.   MRN: [id number]  HPI Pt reports she is doing fairly well. She has discontinued phentermine for weight loss assistance, and intends to do this my increasing duration and frequency of exercise , as well as being more diligent with her diet. She reports some iregularilty in her menses, and is on contraception through gynae, she has questions as to whether she may be perimenopausal. Her allergies have increased in the past few weeks as Spring has started early.    Review of Systems  Constitutional: Negative for fever, chills, activity change, appetite change, fatigue and unexpected weight change.  HENT: Negative for hearing loss, ear pain, congestion, sore throat, rhinorrhea, sneezing, trouble swallowing, neck pain, neck stiffness, postnasal drip and sinus pressure.   Eyes: Negative for photophobia, pain, discharge, redness, itching and visual disturbance.  Respiratory: Negative for cough, chest tightness, shortness of breath and wheezing.   Cardiovascular: Negative for chest pain, palpitations and leg swelling.  Gastrointestinal: Negative for nausea, vomiting, abdominal pain, diarrhea, constipation and blood in stool.  Genitourinary: Negative for dysuria, frequency, hematuria and flank pain.  Musculoskeletal: Negative for myalgias, back pain, joint swelling, arthralgias and gait problem.  Skin: Negative for rash and wound.  Neurological: Negative for dizziness, tremors, seizures, speech difficulty, weakness, numbness and headaches.  Hematological: Negative for adenopathy. Does not bruise/bleed easily.  Psychiatric/Behavioral: Negative for suicidal ideas, hallucinations, behavioral problems, confusion, sleep disturbance and decreased concentration. The patient is not nervous/anxious and is not hyperactive.        Objective:   Physical Exam  Constitutional: She is oriented to person, place, and time. She  appears well-developed and well-nourished.  HENT:  Head: Normocephalic.  Right Ear: External ear normal.  Left Ear: External ear normal.  Mouth/Throat: No oropharyngeal exudate.  Eyes: Conjunctivae and EOM are normal. Right eye exhibits no discharge. Left eye exhibits no discharge. No scleral icterus.  Neck: Normal range of motion. Neck supple. No JVD present. No tracheal deviation present. No thyromegaly present.  Cardiovascular: Normal rate, regular rhythm, normal heart sounds and intact distal pulses.   No murmur heard. Pulmonary/Chest: Effort normal and breath sounds normal. No stridor. No respiratory distress. She has no wheezes. She has no rales. She exhibits no tenderness.  Abdominal: Soft. Bowel sounds are normal. There is no tenderness. There is no rebound and no guarding.  Musculoskeletal: Normal range of motion. She exhibits no edema.  Lymphadenopathy:    She has no cervical adenopathy.  Neurological: She is alert and oriented to person, place, and time. No cranial nerve deficit. Coordination normal.  Skin: Skin is warm and dry. No rash noted. No erythema.  Psychiatric: She has a normal mood and affect. Her behavior is normal. Judgment and thought content normal.          Assessment & Plan:  1. Obesity: unchanged, more consistent attention to regular exercise and diet discussed, phentermine discontinued due to side effects.Goal of 8 pound weight loss in next 4 mths, minimal.. 2Aallergic rhintis: deteriorated, inc use of medications during Spring. 3Hyperlipidemia:Low fat diet discussed and encouraged.. Labs due before next visit. 4GERD; controlled, refill on omeprazole. 5. Depression and anxiety: controlled, pt to continue with psych.

## 2010-12-29 NOTE — Patient Instructions (Addendum)
F/u in 4 months.  Pls continue to follow a diet rich in vegetables and fruits , eat white meats and drink water only, walk at least 30 minutes 5 days per week.  You are doing very well with weight loss.  We will provide a 1500 cal diet sheet.   Cbc, fasting chem 7, lipid,hepATIC,  vitamin D lipid, and tsh  In 4 months.  Continue the meds you are currently taking  You absolutely need to rescedule your mammogram, pls call.

## 2010-12-31 ENCOUNTER — Encounter: Payer: Self-pay | Admitting: Family Medicine

## 2010-12-31 DIAGNOSIS — J309 Allergic rhinitis, unspecified: Secondary | ICD-10-CM | POA: Insufficient documentation

## 2010-12-31 DIAGNOSIS — F329 Major depressive disorder, single episode, unspecified: Secondary | ICD-10-CM | POA: Insufficient documentation

## 2010-12-31 DIAGNOSIS — F251 Schizoaffective disorder, depressive type: Secondary | ICD-10-CM | POA: Insufficient documentation

## 2010-12-31 DIAGNOSIS — J302 Other seasonal allergic rhinitis: Secondary | ICD-10-CM | POA: Insufficient documentation

## 2010-12-31 DIAGNOSIS — K219 Gastro-esophageal reflux disease without esophagitis: Secondary | ICD-10-CM | POA: Insufficient documentation

## 2011-01-01 ENCOUNTER — Other Ambulatory Visit: Payer: Self-pay | Admitting: Family Medicine

## 2011-01-01 DIAGNOSIS — Z139 Encounter for screening, unspecified: Secondary | ICD-10-CM

## 2011-01-05 ENCOUNTER — Telehealth: Payer: Self-pay | Admitting: Family Medicine

## 2011-01-08 NOTE — Telephone Encounter (Signed)
Mailed some diet information sheets to her (1200 and 1500 calorie diet sheet )

## 2011-01-09 ENCOUNTER — Other Ambulatory Visit: Payer: Self-pay | Admitting: Family Medicine

## 2011-01-29 ENCOUNTER — Ambulatory Visit (HOSPITAL_COMMUNITY): Payer: Medicaid Other

## 2011-02-05 ENCOUNTER — Ambulatory Visit (HOSPITAL_COMMUNITY)
Admission: RE | Admit: 2011-02-05 | Discharge: 2011-02-05 | Disposition: A | Discharge status: Home / Self Care | Payer: Medicaid Other | Source: Ambulatory Visit | Attending: Family Medicine | Admitting: Family Medicine

## 2011-02-05 DIAGNOSIS — Z1231 Encounter for screening mammogram for malignant neoplasm of breast: Secondary | ICD-10-CM | POA: Insufficient documentation

## 2011-02-05 DIAGNOSIS — Z139 Encounter for screening, unspecified: Secondary | ICD-10-CM

## 2011-02-13 ENCOUNTER — Telehealth: Payer: Self-pay | Admitting: Family Medicine

## 2011-02-13 NOTE — Telephone Encounter (Signed)
She goes to gynae, they need to prescribe that , if they agree with the request,I will not , pls let her know

## 2011-02-14 ENCOUNTER — Telehealth: Payer: Self-pay | Admitting: Family Medicine

## 2011-02-14 NOTE — Telephone Encounter (Signed)
Karen Cox spoke with patient-

## 2011-02-14 NOTE — Telephone Encounter (Signed)
Patient aware.

## 2011-02-14 NOTE — Telephone Encounter (Signed)
Called patient, left message.

## 2011-02-21 ENCOUNTER — Telehealth: Payer: Self-pay | Admitting: Family Medicine

## 2011-02-21 NOTE — Telephone Encounter (Signed)
error 

## 2011-02-21 NOTE — Telephone Encounter (Signed)
Fell in march and her right ankle is still hurting her and was wanting an xray. Advised urgent care. She agreed  Wanted to know if Victorino Dike at [facility] wouldn't give her a rx for Phytoestrogen on the 30th if Dr. Lodema Hong would write it. I tried to explain to her that if Victorino Dike didn't think she needed it that you wouldn't write it either but she insisted I ask you. (I've never heard of it) Please advise

## 2011-02-21 NOTE — Telephone Encounter (Deleted)
error 

## 2011-02-23 ENCOUNTER — Telehealth: Payer: Self-pay | Admitting: Family Medicine

## 2011-02-23 NOTE — Telephone Encounter (Signed)
Called patient, left message.

## 2011-02-25 NOTE — Telephone Encounter (Signed)
Pls remind her [facility] is taking care of her [facility] scripts

## 2011-02-26 ENCOUNTER — Telehealth: Payer: Self-pay | Admitting: Family Medicine

## 2011-02-26 NOTE — Telephone Encounter (Signed)
Patient does not know why she called last week

## 2011-02-26 NOTE — Telephone Encounter (Signed)
Returned call, patient does not know why she called last week

## 2011-02-26 NOTE — Telephone Encounter (Signed)
Karen Cox spoke with her

## 2011-03-02 ENCOUNTER — Telehealth: Payer: Self-pay | Admitting: Family Medicine

## 2011-03-06 NOTE — Telephone Encounter (Signed)
Patient was advised more than once to go to urgent care last week

## 2011-03-07 ENCOUNTER — Telehealth: Payer: Self-pay | Admitting: Family Medicine

## 2011-03-07 ENCOUNTER — Other Ambulatory Visit (HOSPITAL_COMMUNITY)
Admission: RE | Admit: 2011-03-07 | Discharge: 2011-03-07 | Disposition: A | Discharge status: Home / Self Care | Payer: Medicaid Other | Source: Ambulatory Visit | Attending: Obstetrics and Gynecology | Admitting: Obstetrics and Gynecology

## 2011-03-07 ENCOUNTER — Other Ambulatory Visit: Payer: Self-pay | Admitting: Adult Health

## 2011-03-07 DIAGNOSIS — Z113 Encounter for screening for infections with a predominantly sexual mode of transmission: Secondary | ICD-10-CM | POA: Insufficient documentation

## 2011-03-07 DIAGNOSIS — Z01419 Encounter for gynecological examination (general) (routine) without abnormal findings: Secondary | ICD-10-CM | POA: Insufficient documentation

## 2011-03-07 NOTE — Telephone Encounter (Signed)
Called back no answer. Will advise again that she needs to go to urgent care. She was told this several times already and even gave her the number to the urgent care and she called them and said she was going 2 weeks ago.

## 2011-03-08 ENCOUNTER — Telehealth: Payer: Self-pay | Admitting: Family Medicine

## 2011-03-08 ENCOUNTER — Other Ambulatory Visit: Payer: Self-pay | Admitting: Family Medicine

## 2011-03-08 DIAGNOSIS — M79671 Pain in right foot: Secondary | ICD-10-CM

## 2011-03-08 NOTE — Telephone Encounter (Signed)
Patient keeps calling about the right foot xray from where she fell a few months ago. Foot is still swelling and hurting. I advised urgent care a few weeks ago when she called and she said she was going. Today she said that the [facility] will charge her $30 to take her to the [facility] but will take her to [facility] for free. She wants an order for right foot xray and states that you saw it at her last OV

## 2011-03-08 NOTE — Telephone Encounter (Signed)
pls sched an appt with ortho of pt's choice eval swelling and pain in foot following a fall, let her know ortho will do the xray and can fully eval the foot, Karen Cox or Karen Cox wherever she wants to go

## 2011-03-08 NOTE — Telephone Encounter (Signed)
Already have sent DR the message

## 2011-03-08 NOTE — Telephone Encounter (Signed)
error 

## 2011-05-03 ENCOUNTER — Encounter: Payer: Self-pay | Admitting: Family Medicine

## 2011-05-08 ENCOUNTER — Ambulatory Visit (INDEPENDENT_AMBULATORY_CARE_PROVIDER_SITE_OTHER): Payer: Medicaid Other | Admitting: Family Medicine

## 2011-05-08 ENCOUNTER — Encounter: Payer: Self-pay | Admitting: Family Medicine

## 2011-05-08 VITALS — BP 120/82 | HR 104 | Resp 16 | Ht 65.0 in | Wt 253.1 lb

## 2011-05-08 DIAGNOSIS — E785 Hyperlipidemia, unspecified: Secondary | ICD-10-CM

## 2011-05-08 DIAGNOSIS — J309 Allergic rhinitis, unspecified: Secondary | ICD-10-CM

## 2011-05-08 DIAGNOSIS — E8881 Metabolic syndrome: Secondary | ICD-10-CM

## 2011-05-08 DIAGNOSIS — F32A Depression, unspecified: Secondary | ICD-10-CM

## 2011-05-08 DIAGNOSIS — E669 Obesity, unspecified: Secondary | ICD-10-CM

## 2011-05-08 DIAGNOSIS — R5381 Other malaise: Secondary | ICD-10-CM

## 2011-05-08 DIAGNOSIS — F341 Dysthymic disorder: Secondary | ICD-10-CM

## 2011-05-08 DIAGNOSIS — F419 Anxiety disorder, unspecified: Secondary | ICD-10-CM

## 2011-05-08 DIAGNOSIS — R7301 Impaired fasting glucose: Secondary | ICD-10-CM

## 2011-05-08 DIAGNOSIS — J302 Other seasonal allergic rhinitis: Secondary | ICD-10-CM

## 2011-05-08 LAB — HEPATIC FUNCTION PANEL
ALT: 13 U/L (ref 0–35)
Alkaline Phosphatase: 91 U/L (ref 39–117)
Bilirubin, Direct: 0.2 mg/dL (ref 0.0–0.3)
Indirect Bilirubin: 0.5 mg/dL (ref 0.0–0.9)

## 2011-05-08 LAB — LIPID PANEL
Cholesterol: 133 mg/dL (ref 0–200)
VLDL: 36 mg/dL (ref 0–40)

## 2011-05-08 NOTE — Assessment & Plan Note (Signed)
Low fat diet discussed and encouraged. Labs today

## 2011-05-08 NOTE — Assessment & Plan Note (Signed)
Controlled, no change in medication  

## 2011-05-08 NOTE — Assessment & Plan Note (Signed)
Deteriorated. Patient re-educated about  the importance of commitment to a  minimum of 150 minutes of exercise per week. The importance of healthy food choices with portion control discussed. Encouraged to start a food diary, count calories and to consider  joining a support group. Sample diet sheets offered. Goals set by the patient for the next several months.    

## 2011-05-08 NOTE — Progress Notes (Signed)
  Subjective:    Patient ID: Karen Cox, female    DOB: [birth year], 50 y.o.   MRN: [id number]  HPI The PT is here for follow up and re-evaluation of chronic medical conditions, medication management and review of any available recent lab and radiology data.  Preventive health is updated, specifically  Cancer screening and Immunization.   Questions or concerns regarding consultations or procedures which the PT has had in the interim are  addressed. The PT denies any adverse reactions to current medications since the last visit.  C/o excessive drowsiness with psych meds some of which she has stopped    Review of Systems Denies recent fever or chills.c/o fatigue Denies sinus pressure, nasal congestion, ear pain or sore throat. Denies chest congestion, productive cough or wheezing. Denies chest pains, palpitations and leg swelling Denies abdominal pain, nausea, vomiting,diarrhea or constipation.   Denies dysuria, frequency, hesitancy or incontinence. Denies joint pain, swelling and limitation in mobility. Denies headaches, seizures, numbness, or tingling. Denies uncontrolled  depression, anxiety or insomnia. Denies skin break down or rash.        Objective:   Physical Exam Patient alert and oriented and in no cardiopulmonary distress.  HEENT: No facial asymmetry, EOMI, no sinus tenderness,  oropharynx pink and moist.  Neck supple no adenopathy.  Chest: Clear to auscultation bilaterally.  CVS: S1, S2 no murmurs, no S3.  ABD: Soft non tender. Bowel sounds normal.  Ext: No edema  MS: Adequate ROM spine, shoulders, hips and knees.  Skin: Intact, no ulcerations or rash noted.  Psych: Good eye contact, normal affect. Memory intact not anxious or depressed appearing.  CNS: CN 2-12 intact, power, tone and sensation normal throughout.        Assessment & Plan:   Anxiety and depression Pt reports discontinuing some of her psych meds with no discussion with her  therapist, states she was feeling excessively sleepy with them, i advised she arrange earlier f/u with psych than October as she states  HYPERLIPIDEMIA Low fat diet discussed and encouraged. Labs today  OBESITY Deteriorated. Patient re-educated about  the importance of commitment to a  minimum of 150 minutes of exercise per week. The importance of healthy food choices with portion control discussed. Encouraged to start a food diary, count calories and to consider  joining a support group. Sample diet sheets offered. Goals set by the patient for the next several months.     Seasonal allergies Controlled, no change in medication

## 2011-05-08 NOTE — Patient Instructions (Addendum)
F/U in 3.5 months   Lipid , hepatic and blood sugar fasting today.  It is important that you exercise regularly at least 30 minutes 5 times a week. If you develop chest pain, have severe difficulty breathing, or feel very tired, stop exercising immediately and seek medical attention   A healthy diet is rich in fruit, vegetables and whole grains. Poultry fish, nuts and beans are a healthy choice for protein rather then red meat. A low sodium diet and drinking 64 ounces of water daily is generally recommended. Oils and sweet should be limited. Carbohydrates especially for those who are diabetic or overweight, should be limited to 34-45 gram per meal. It is important to eat on a regular schedule, at least 3 times daily. Snacks should be primarily fruits, vegetables or nuts.   Weight loss goal is 3 to 5pounds per month  Pls call your psychiatrist since you have stopped some of your medication for an earlier appt.  HAPPY BIRTHDAY...tomorrow!!!  .

## 2011-05-08 NOTE — Assessment & Plan Note (Signed)
Pt reports discontinuing some of her psych meds with no discussion with her therapist, states she was feeling excessively sleepy with them, i advised she arrange earlier f/u with psych than October as she states

## 2011-07-11 ENCOUNTER — Telehealth: Payer: Self-pay | Admitting: Family Medicine

## 2011-07-13 NOTE — Telephone Encounter (Signed)
Patient has the same rash she always has and wants some prednisone called in. Advised hydrocortisone cream and benadryl but she said she wanted the prednisone

## 2011-07-16 NOTE — Telephone Encounter (Signed)
I agree with your advice at this time, let her know pls

## 2011-07-16 NOTE — Telephone Encounter (Signed)
Patient aware.

## 2011-08-11 ENCOUNTER — Other Ambulatory Visit: Payer: Self-pay | Admitting: Family Medicine

## 2011-08-15 ENCOUNTER — Encounter: Payer: Self-pay | Admitting: Family Medicine

## 2011-08-17 ENCOUNTER — Encounter: Payer: Self-pay | Admitting: Family Medicine

## 2011-08-20 ENCOUNTER — Ambulatory Visit: Payer: Medicaid Other | Admitting: Family Medicine

## 2011-08-23 ENCOUNTER — Ambulatory Visit: Payer: Medicaid Other | Admitting: Family Medicine

## 2011-09-04 ENCOUNTER — Emergency Department (HOSPITAL_COMMUNITY)
Admission: EM | Admit: 2011-09-04 | Discharge: 2011-09-04 | Disposition: A | Discharge status: Home / Self Care | Payer: Medicaid Other | Attending: Emergency Medicine | Admitting: Emergency Medicine

## 2011-09-04 ENCOUNTER — Telehealth: Payer: Self-pay | Admitting: Family Medicine

## 2011-09-04 ENCOUNTER — Emergency Department (HOSPITAL_COMMUNITY): Payer: Medicaid Other

## 2011-09-04 ENCOUNTER — Encounter (HOSPITAL_COMMUNITY): Payer: Self-pay | Admitting: Emergency Medicine

## 2011-09-04 DIAGNOSIS — N390 Urinary tract infection, site not specified: Secondary | ICD-10-CM | POA: Insufficient documentation

## 2011-09-04 DIAGNOSIS — S82839A Other fracture of upper and lower end of unspecified fibula, initial encounter for closed fracture: Secondary | ICD-10-CM

## 2011-09-04 DIAGNOSIS — R296 Repeated falls: Secondary | ICD-10-CM | POA: Insufficient documentation

## 2011-09-04 DIAGNOSIS — Z87891 Personal history of nicotine dependence: Secondary | ICD-10-CM | POA: Insufficient documentation

## 2011-09-04 DIAGNOSIS — F329 Major depressive disorder, single episode, unspecified: Secondary | ICD-10-CM | POA: Insufficient documentation

## 2011-09-04 DIAGNOSIS — S82899A Other fracture of unspecified lower leg, initial encounter for closed fracture: Secondary | ICD-10-CM | POA: Insufficient documentation

## 2011-09-04 DIAGNOSIS — I498 Other specified cardiac arrhythmias: Secondary | ICD-10-CM | POA: Insufficient documentation

## 2011-09-04 DIAGNOSIS — Y92009 Unspecified place in unspecified non-institutional (private) residence as the place of occurrence of the external cause: Secondary | ICD-10-CM | POA: Insufficient documentation

## 2011-09-04 DIAGNOSIS — F3289 Other specified depressive episodes: Secondary | ICD-10-CM | POA: Insufficient documentation

## 2011-09-04 LAB — DIFFERENTIAL
Basophils Absolute: 0 10*3/uL (ref 0.0–0.1)
Eosinophils Relative: 0 % (ref 0–5)
Lymphocytes Relative: 15 % (ref 12–46)
Lymphs Abs: 1.5 10*3/uL (ref 0.7–4.0)
Monocytes Absolute: 0.2 10*3/uL (ref 0.1–1.0)

## 2011-09-04 LAB — URINALYSIS, ROUTINE W REFLEX MICROSCOPIC
Bilirubin Urine: NEGATIVE
Glucose, UA: NEGATIVE mg/dL
Hgb urine dipstick: NEGATIVE
Specific Gravity, Urine: 1.025 (ref 1.005–1.030)
pH: 6 (ref 5.0–8.0)

## 2011-09-04 LAB — URINE MICROSCOPIC-ADD ON

## 2011-09-04 LAB — BASIC METABOLIC PANEL
CO2: 25 mEq/L (ref 19–32)
Calcium: 9.7 mg/dL (ref 8.4–10.5)
Creatinine, Ser: 0.87 mg/dL (ref 0.50–1.10)
Glucose, Bld: 131 mg/dL — ABNORMAL HIGH (ref 70–99)

## 2011-09-04 LAB — CBC
HCT: 38.5 % (ref 36.0–46.0)
MCH: 26.3 pg (ref 26.0–34.0)
MCV: 82.3 fL (ref 78.0–100.0)
RDW: 14.1 % (ref 11.5–15.5)
WBC: 10 10*3/uL (ref 4.0–10.5)

## 2011-09-04 LAB — PREGNANCY, URINE: Preg Test, Ur: NEGATIVE

## 2011-09-04 MED ORDER — HYDROCODONE-ACETAMINOPHEN 5-325 MG PO TABS: 1.0000 | ORAL_TABLET | Freq: Four times a day (QID) | ORAL | Status: AC | PRN

## 2011-09-04 MED ORDER — SULFAMETHOXAZOLE-TRIMETHOPRIM 800-160 MG PO TABS: 1.0000 | ORAL_TABLET | Freq: Two times a day (BID) | ORAL | Status: AC

## 2011-09-04 NOTE — ED Provider Notes (Signed)
History  This chart was scribed for Karen Kras, MD by Bennett Scrape. This patient was seen in room APA17/[facility]17 and the patient's care was started at 1:05PM.  CSN: [id number] Arrival date & time: 09/04/2011 12:35 PM   First MD Initiated Contact with Patient 09/04/11 1259      Chief Complaint  Patient presents with  . Fatigue  . Fall  . Ankle Pain    The history is provided by the patient. No language interpreter was used.   Karen Cox is a 50 y.o. female who presents to the Emergency Department complaining of a fall injury to her left ankle that occurred 2.5 hours ago when the pt fell straight back after becoming weak and hit her left ankle on the floor. Pt states that she felt nauseated and numb in both of her legs when she woke up today, but continued about her daily chores until the fall occurred. Pt did not have any blood loss and was ambulatory after the event. Pt reports having a similar episode 7 or 8 months ago, but states that she was not diagnosed with anything. Pt denies LOC, hematochezia, fever, and visual changes as associated symptoms. Pt has a h/o hyperlipidemia, but denies a h/o diabetes.   Past Medical History  Diagnosis Date  . Allergy   . Depression   . GERD (gastroesophageal reflux disease)   . Anxiety   . Hyperlipidemia     History reviewed. No pertinent past surgical history.  Family History  Problem Relation Age of Onset  . Diabetes Father   . Heart disease Father   . Hypertension Father     History  Substance Use Topics  . Smoking status: Former Smoker    Quit date: 09/03/1981  . Smokeless tobacco: Never Used  . Alcohol Use: No    OB History    Grav Para Term Preterm Abortions TAB SAB Ect Mult Living   1 1 1       1       Review of Systems A complete 10 system review of systems was obtained and is otherwise negative except as noted in the HPI.   Allergies  Benadryl; Vicks; Aspirin; and Latex  Home Medications   Current  Outpatient Rx  Name Route Sig Dispense Refill  . CETIRIZINE HCL 10 MG PO TABS Oral Take 10 mg by mouth at bedtime.        Triage Vitals: BP 115/92  Pulse 121  Temp(Src) 98 F (36.7 C) (Oral)  Resp 20  Ht 5\' 4"  (1.626 m)  Wt 244 lb (110.678 kg)  BMI 41.88 kg/m2  SpO2 99%  LMP 07/10/2011  Physical Exam  Nursing note and vitals reviewed. Constitutional: She is oriented to person, place, and time. She appears well-developed and well-nourished. No distress.  HENT:  Head: Normocephalic and atraumatic.  Right Ear: External ear normal.  Left Ear: External ear normal.  Mouth/Throat: Oropharynx is clear and moist.  Eyes: Conjunctivae are normal. Right eye exhibits no discharge. Left eye exhibits no discharge. No scleral icterus.  Neck: Neck supple. No tracheal deviation present.  Cardiovascular: Normal rate, regular rhythm and intact distal pulses.   Pulmonary/Chest: Effort normal and breath sounds normal. No stridor. No respiratory distress. She has no wheezes. She has no rales.  Abdominal: Soft. Bowel sounds are normal. She exhibits no distension. There is no tenderness. There is no rebound and no guarding.  Musculoskeletal: She exhibits tenderness (tenderness along the lateral aspect of the left melloulus of  the left ankle). She exhibits no edema.  Neurological: She is alert and oriented to person, place, and time. She has normal strength. No cranial nerve deficit ( no gross defecits noted) or sensory deficit. She exhibits normal muscle tone. She displays no seizure activity. Coordination normal.       No pronator drift bilateral upper extrem, able to hold both legs off bed for 5 seconds, sensation intact in all extremities, no visual field cuts, no left or right sided neglect  Skin: Skin is warm and dry. No rash noted.  Psychiatric: She has a normal mood and affect.    ED Course  Procedures (including critical care time)  Date: 09/04/2011  Rate: 106  Rhythm: sinus tachycardia  QRS  Axis: normal  Intervals: normal  ST/T Wave abnormalities: normal  Conduction Disutrbances:none  Narrative Interpretation:   Old EKG Reviewed: none available   DIAGNOSTIC STUDIES: Oxygen Saturation is 99% on room air, normal by my interpretation.    COORDINATION OF CARE: 1:10PM-Discussed treatment plan with patient at bedside and patient agreed to plan.  Labs Reviewed  DIFFERENTIAL - Abnormal; Notable for the following:    Neutrophils Relative 83 (*)    Neutro Abs 8.3 (*)    Monocytes Relative 2 (*)    All other components within normal limits  BASIC METABOLIC PANEL - Abnormal; Notable for the following:    Glucose, Bld 131 (*)    GFR calc non Af Amer 77 (*)    GFR calc Af Amer 89 (*)    All other components within normal limits  URINALYSIS, ROUTINE W REFLEX MICROSCOPIC - Abnormal; Notable for the following:    Leukocytes, UA SMALL (*)    All other components within normal limits  URINE MICROSCOPIC-ADD ON - Abnormal; Notable for the following:    Squamous Epithelial / LPF FEW (*)    Bacteria, UA MANY (*)    Casts HYALINE CASTS (*)    All other components within normal limits  CBC  PREGNANCY, URINE  URINE CULTURE   Dg Ankle Complete Left  09/04/2011  *RADIOLOGY REPORT*  Clinical Data: Fall, lateral ankle pain, swelling.  LEFT ANKLE COMPLETE - 3+ VIEW  Comparison: None  Findings: There is an oblique fracture through the distal fibula, essentially nondisplaced.  Overlying soft tissue swelling.  No tibial abnormality.  IMPRESSION: Oblique distal fibular fracture.  Original Report Authenticated By: Cyndie Chime, M.D.     1. Urinary tract infection   2. Fracture of distal fibula       MDM  Patient an episode of weakness this morning.  She does not have hypotension. There are no signs to suggest a severe  infection. She did have tachycardia however heart rate is 100 at rest. Patient does not have any chest pain or shortness of breath to suggest pulmonary embolism she is  not currently anemic.  Patient does have urinary tract infection I'll prescribe antibiotics for that. Suspect that might be the cause of her weakness feeling. She does have a distal fibula fracture. Patient was placed in a Cam Walker and given crutches. I will refer her to orthopedics.        I personally performed the services described in this documentation, which was scribed in my presence.  The recorded information has been reviewed and considered.     Karen Kras, MD 09/04/11 1430

## 2011-09-04 NOTE — ED Notes (Signed)
Pt states she fell this morning at approx 10:30 am.  Pt states that she fell straight back after becoming weak and hit her left ankle on the floor.

## 2011-09-04 NOTE — ED Notes (Signed)
Pt alert and oriented with respirations even and unlabored.  NAD at this time.  Discharge instructions reviewed with pt and pt's mother and pt verbalized understanding.  Pt transported to lobby in wheelchair for pt safety and pt's mother to transport pt home.

## 2011-09-04 NOTE — ED Notes (Signed)
Patient c/o intermittent weakness that started this morning. Per patient generalized weakness. Denies any at this time. Per patient felt weak suddenly and fell hurting left ankle.

## 2011-09-04 NOTE — ED Notes (Signed)
Patient reports stopping all depression medication recently.

## 2011-09-04 NOTE — Telephone Encounter (Signed)
Called back, no answer.

## 2011-09-05 NOTE — Telephone Encounter (Signed)
Was evaluated at the ER

## 2011-09-08 LAB — URINE CULTURE

## 2011-10-08 ENCOUNTER — Encounter: Payer: Self-pay | Admitting: Family Medicine

## 2011-10-16 ENCOUNTER — Ambulatory Visit (INDEPENDENT_AMBULATORY_CARE_PROVIDER_SITE_OTHER): Payer: Medicaid Other | Admitting: Family Medicine

## 2011-10-16 ENCOUNTER — Encounter: Payer: Self-pay | Admitting: Family Medicine

## 2011-10-16 VITALS — BP 128/74 | HR 89 | Resp 18 | Ht 65.0 in | Wt 240.0 lb

## 2011-10-16 DIAGNOSIS — F341 Dysthymic disorder: Secondary | ICD-10-CM

## 2011-10-16 DIAGNOSIS — L259 Unspecified contact dermatitis, unspecified cause: Secondary | ICD-10-CM

## 2011-10-16 DIAGNOSIS — F419 Anxiety disorder, unspecified: Secondary | ICD-10-CM

## 2011-10-16 DIAGNOSIS — Z23 Encounter for immunization: Secondary | ICD-10-CM

## 2011-10-16 DIAGNOSIS — E669 Obesity, unspecified: Secondary | ICD-10-CM

## 2011-10-16 MED ORDER — PREDNISONE (PAK) 5 MG PO TABS: 5.0000 mg | ORAL_TABLET | ORAL | Status: DC

## 2011-10-16 NOTE — Assessment & Plan Note (Signed)
Reports recent flare, fell hurt her left ankle, was prescribed a brace by ortho still has not filled script

## 2011-10-16 NOTE — Patient Instructions (Signed)
F/u in 4 months.   I think that you are either perimenopausal or menopausal, discuss with the gynecologist.  It is important that you exercise regularly at least 30 minutes 5 times a week. If you develop chest pain, have severe difficulty breathing, or feel very tired, stop exercising immediately and seek medical attention    Medication has been sent in for rash.   Heart and lung exam is normal.  TdaP today.  You need to get the flu vaccine at the pharmacy since we are out

## 2011-10-16 NOTE — Assessment & Plan Note (Signed)
3 month h/o pruritic rash of forearm , back and arms

## 2011-10-16 NOTE — Assessment & Plan Note (Signed)
Improved. Pt applauded on succesful weight loss through lifestyle change, and encouraged to continue same. Weight loss goal set for the next several months.  

## 2011-10-21 NOTE — Progress Notes (Signed)
  Subjective:    Patient ID: Karen Cox, female    DOB: [birth year], 51 y.o.   MRN: [id number]  HPI The PT is here for follow up and re-evaluation of chronic medical conditions, medication management and review of any available recent lab and radiology data.  Preventive health is updated, specifically  Cancer screening and Immunization.   Questions or concerns regarding consultations or procedures which the PT has had in the interim are  addressed. The PT denies any adverse reactions to current medications since the last visit.  C/o increased itching and rash on arms wants med for this.No new toiletries  Or foods       Review of Systems See HPI Denies recent fever or chills. Denies sinus pressure, nasal congestion, ear pain or sore throat. Denies chest congestion, productive cough or wheezing. Denies chest pains, palpitations and leg swelling Denies abdominal pain, nausea, vomiting,diarrhea or constipation.   Denies dysuria, frequency, hesitancy or incontinence. Denies joint pain, swelling and limitation in mobility. Denies headaches, seizures, numbness, or tingling. Denies depression, anxiety or insomnia. Reports irreg bleeding and is being evaluated by gynae for this. Continues to work on weight loss through lifestyle chage       Objective:   Physical Exam Patient alert and oriented and in no cardiopulmonary distress.  HEENT: No facial asymmetry, EOMI, no sinus tenderness,  oropharynx pink and moist.  Neck supple no adenopathy.  Chest: Clear to auscultation bilaterally.  CVS: S1, S2 no murmurs, no S3.  ABD: Soft non tender. Bowel sounds normal.  Ext: No edema  MS: Adequate ROM spine, shoulders, hips and knees.  Skin: Intact, macular rash on forearm, mildly erythematous Good eye contact, normal affect. Memory intact mildly  anxious not depressed appearing.  CNS: CN 2-12 intact, power, tone and sensation normal throughout.        Assessment & Plan:

## 2011-10-29 ENCOUNTER — Ambulatory Visit: Payer: Medicaid Other | Admitting: Family Medicine

## 2011-11-13 ENCOUNTER — Other Ambulatory Visit: Payer: Self-pay | Admitting: Family Medicine

## 2011-11-13 ENCOUNTER — Telehealth: Payer: Self-pay | Admitting: Family Medicine

## 2011-11-13 DIAGNOSIS — L509 Urticaria, unspecified: Secondary | ICD-10-CM

## 2011-11-13 NOTE — Telephone Encounter (Signed)
Referral entered  

## 2011-12-26 ENCOUNTER — Other Ambulatory Visit: Payer: Self-pay

## 2011-12-26 ENCOUNTER — Telehealth: Payer: Self-pay | Admitting: Family Medicine

## 2011-12-26 MED ORDER — TRIAMCINOLONE ACETONIDE 0.1 % EX CREA: 1.0000 "application " | TOPICAL_CREAM | Freq: Two times a day (BID) | CUTANEOUS | Status: DC

## 2011-12-26 NOTE — Telephone Encounter (Signed)
Refilled pt med and left voicemail notifying.

## 2011-12-27 ENCOUNTER — Other Ambulatory Visit: Payer: Self-pay

## 2011-12-27 ENCOUNTER — Telehealth: Payer: Self-pay

## 2011-12-27 MED ORDER — HYDROCORTISONE 0.5 % EX CREA: TOPICAL_CREAM | Freq: Two times a day (BID) | CUTANEOUS | Status: DC

## 2011-12-27 NOTE — Telephone Encounter (Signed)
Med sent.

## 2011-12-27 NOTE — Telephone Encounter (Signed)
Refill x1 and let her know please

## 2012-02-11 ENCOUNTER — Ambulatory Visit (INDEPENDENT_AMBULATORY_CARE_PROVIDER_SITE_OTHER): Payer: Medicaid Other | Admitting: Family Medicine

## 2012-02-11 ENCOUNTER — Encounter: Payer: Self-pay | Admitting: Family Medicine

## 2012-02-11 VITALS — BP 122/82 | HR 88 | Resp 16 | Ht 65.0 in | Wt 239.8 lb

## 2012-02-11 DIAGNOSIS — F419 Anxiety disorder, unspecified: Secondary | ICD-10-CM

## 2012-02-11 DIAGNOSIS — F341 Dysthymic disorder: Secondary | ICD-10-CM

## 2012-02-11 DIAGNOSIS — R5381 Other malaise: Secondary | ICD-10-CM

## 2012-02-11 DIAGNOSIS — E669 Obesity, unspecified: Secondary | ICD-10-CM

## 2012-02-11 DIAGNOSIS — R7301 Impaired fasting glucose: Secondary | ICD-10-CM

## 2012-02-11 DIAGNOSIS — E8881 Metabolic syndrome: Secondary | ICD-10-CM

## 2012-02-11 DIAGNOSIS — J309 Allergic rhinitis, unspecified: Secondary | ICD-10-CM

## 2012-02-11 DIAGNOSIS — J302 Other seasonal allergic rhinitis: Secondary | ICD-10-CM

## 2012-02-11 DIAGNOSIS — R5383 Other fatigue: Secondary | ICD-10-CM

## 2012-02-11 DIAGNOSIS — L259 Unspecified contact dermatitis, unspecified cause: Secondary | ICD-10-CM

## 2012-02-11 DIAGNOSIS — E785 Hyperlipidemia, unspecified: Secondary | ICD-10-CM

## 2012-02-11 LAB — HEMOGLOBIN A1C
Hgb A1c MFr Bld: 5.4 % (ref <5.7)
Mean Plasma Glucose: 108 mg/dL (ref <117)

## 2012-02-11 MED ORDER — CETIRIZINE HCL 10 MG PO TABS: 10.0000 mg | ORAL_TABLET | Freq: Every day | ORAL | Status: DC

## 2012-02-11 NOTE — Patient Instructions (Signed)
F/u in 5.5 month  Please work on weight loss.  HBA1C and TSH today.  Fasting lipid, cbc, chem 7 in 5.5 month  Please start zyrtec for allergy symptoms.  Mammogram is past due please schedule.  I hope you get improvement in your skin after you see the dermatologist

## 2012-02-11 NOTE — Progress Notes (Signed)
  Subjective:    Patient ID: Karen Cox, female    DOB: 1961/10/07, 51 y.o.   MRN: [id number]  HPI The PT is here for follow up and re-evaluation of chronic medical conditions, medication management and review of any available recent lab and radiology data.  Preventive health is updated, specifically  Cancer screening and Immunization.   Questions or concerns regarding consultations or procedures which the PT has had in the interim are  addressed. The PT denies any adverse reactions to current medications since the last visit.  There are no new concerns.  There are no specific complaints    \   Review of Systems See HPI Denies recent fever or chills. Denies sinus pressure, nasal congestion, ear pain or sore throat.Has noted increased allergy symptoms in the Spring on no maintainace med , needs to resume Denies chest congestion, productive cough or wheezing. Denies chest pains, palpitations and leg swelling Denies abdominal pain, nausea, vomiting,diarrhea or constipation.   Denies dysuria, frequency, hesitancy or incontinence. Denies joint pain, swelling and limitation in mobility. Denies headaches, seizures, numbness, or tingling. Denies depression, anxiety or insomnia. C/o skin rash which itches, has appt with dermatology this month       Objective:   Physical Exam  Patient alert and oriented and in no cardiopulmonary distress.  HEENT: No facial asymmetry, EOMI, no sinus tenderness,  oropharynx pink and moist.  Neck supple no adenopathy.  Chest: Clear to auscultation bilaterally.  CVS: S1, S2 no murmurs, no S3.  ABD: Soft non tender. Bowel sounds normal.  Ext: No edema  MS: Adequate ROM spine, shoulders, hips and knees.  Skin: Intact, hyperpigmented macular lesions on forearms  Psych: Good eye contact, normal affect. Memory intact not anxious or depressed appearing.  CNS: CN 2-12 intact, power, tone and sensation normal throughout.       Assessment &  Plan:

## 2012-02-11 NOTE — Assessment & Plan Note (Signed)
Has upcoming with dermatology this month

## 2012-02-11 NOTE — Progress Notes (Signed)
Waist circumference 44.5 inches

## 2012-02-11 NOTE — Assessment & Plan Note (Signed)
Hyperlipidemia:Low fat diet discussed and encouraged.  Dietary management only 

## 2012-02-11 NOTE — Assessment & Plan Note (Signed)
Remains at high risk for diabetes due to obesity, dyslipidemia and large abdominal girth, encouraged to work more consistently on lifestyle chanbge to lose weight

## 2012-02-11 NOTE — Assessment & Plan Note (Signed)
Increased symptoms with Spring pt to resume daily medication

## 2012-02-11 NOTE — Assessment & Plan Note (Signed)
Controlled, no change in medication  followed by psych pt to continue same

## 2012-02-11 NOTE — Assessment & Plan Note (Signed)
Unchanged. Patient re-educated about  the importance of commitment to a  minimum of 150 minutes of exercise per week. The importance of healthy food choices with portion control discussed. Encouraged to start a food diary, count calories and to consider  joining a support group. Sample diet sheets offered. Goals set by the patient for the next several months.    

## 2012-02-18 ENCOUNTER — Ambulatory Visit: Payer: Medicaid Other | Admitting: Family Medicine

## 2012-02-22 ENCOUNTER — Other Ambulatory Visit: Payer: Self-pay | Admitting: Family Medicine

## 2012-02-22 DIAGNOSIS — Z139 Encounter for screening, unspecified: Secondary | ICD-10-CM

## 2012-03-17 ENCOUNTER — Ambulatory Visit (HOSPITAL_COMMUNITY)
Admission: RE | Admit: 2012-03-17 | Discharge: 2012-03-17 | Disposition: A | Discharge status: Home / Self Care | Payer: Medicaid Other | Source: Ambulatory Visit | Attending: Family Medicine | Admitting: Family Medicine

## 2012-03-17 DIAGNOSIS — Z1231 Encounter for screening mammogram for malignant neoplasm of breast: Secondary | ICD-10-CM | POA: Insufficient documentation

## 2012-03-17 DIAGNOSIS — Z139 Encounter for screening, unspecified: Secondary | ICD-10-CM

## 2012-03-26 ENCOUNTER — Telehealth: Payer: Self-pay | Admitting: Family Medicine

## 2012-03-26 NOTE — Telephone Encounter (Signed)
Information sent to her on 1500 calorie diet

## 2012-03-26 NOTE — Telephone Encounter (Signed)
Error

## 2012-04-14 ENCOUNTER — Other Ambulatory Visit (HOSPITAL_COMMUNITY)
Admission: RE | Admit: 2012-04-14 | Discharge: 2012-04-14 | Disposition: A | Discharge status: Home / Self Care | Payer: Medicaid Other | Source: Ambulatory Visit | Attending: Obstetrics and Gynecology | Admitting: Obstetrics and Gynecology

## 2012-04-14 ENCOUNTER — Other Ambulatory Visit: Payer: Self-pay | Admitting: Adult Health

## 2012-04-14 DIAGNOSIS — R87810 Cervical high risk human papillomavirus (HPV) DNA test positive: Secondary | ICD-10-CM | POA: Insufficient documentation

## 2012-04-14 DIAGNOSIS — Z01419 Encounter for gynecological examination (general) (routine) without abnormal findings: Secondary | ICD-10-CM | POA: Insufficient documentation

## 2012-04-14 DIAGNOSIS — Z113 Encounter for screening for infections with a predominantly sexual mode of transmission: Secondary | ICD-10-CM | POA: Insufficient documentation

## 2012-05-08 ENCOUNTER — Telehealth: Payer: Self-pay | Admitting: Family Medicine

## 2012-05-12 NOTE — Telephone Encounter (Signed)
Spoke with patient and as pharmacist told her, as long as she can tolerate coke and tylenol, taking that would be fine

## 2012-08-18 ENCOUNTER — Ambulatory Visit (INDEPENDENT_AMBULATORY_CARE_PROVIDER_SITE_OTHER): Payer: Medicaid Other | Admitting: Family Medicine

## 2012-08-18 ENCOUNTER — Other Ambulatory Visit (HOSPITAL_COMMUNITY)
Admission: RE | Admit: 2012-08-18 | Discharge: 2012-08-18 | Disposition: A | Discharge status: Home / Self Care | Payer: Medicaid Other | Source: Ambulatory Visit | Attending: Family Medicine | Admitting: Family Medicine

## 2012-08-18 ENCOUNTER — Encounter: Payer: Self-pay | Admitting: Family Medicine

## 2012-08-18 VITALS — BP 132/86 | HR 110 | Resp 18 | Ht 65.0 in | Wt 257.0 lb

## 2012-08-18 DIAGNOSIS — E669 Obesity, unspecified: Secondary | ICD-10-CM

## 2012-08-18 DIAGNOSIS — E785 Hyperlipidemia, unspecified: Secondary | ICD-10-CM

## 2012-08-18 DIAGNOSIS — Z23 Encounter for immunization: Secondary | ICD-10-CM

## 2012-08-18 DIAGNOSIS — N3000 Acute cystitis without hematuria: Secondary | ICD-10-CM

## 2012-08-18 DIAGNOSIS — F419 Anxiety disorder, unspecified: Secondary | ICD-10-CM

## 2012-08-18 DIAGNOSIS — R7301 Impaired fasting glucose: Secondary | ICD-10-CM

## 2012-08-18 DIAGNOSIS — J302 Other seasonal allergic rhinitis: Secondary | ICD-10-CM

## 2012-08-18 DIAGNOSIS — Z113 Encounter for screening for infections with a predominantly sexual mode of transmission: Secondary | ICD-10-CM | POA: Insufficient documentation

## 2012-08-18 DIAGNOSIS — F32A Depression, unspecified: Secondary | ICD-10-CM

## 2012-08-18 DIAGNOSIS — F341 Dysthymic disorder: Secondary | ICD-10-CM

## 2012-08-18 DIAGNOSIS — N760 Acute vaginitis: Secondary | ICD-10-CM

## 2012-08-18 DIAGNOSIS — Z1211 Encounter for screening for malignant neoplasm of colon: Secondary | ICD-10-CM

## 2012-08-18 DIAGNOSIS — J309 Allergic rhinitis, unspecified: Secondary | ICD-10-CM

## 2012-08-18 LAB — POCT URINALYSIS DIPSTICK
Blood, UA: NEGATIVE
Spec Grav, UA: >=1.03
Urobilinogen, UA: 0.2
pH, UA: 5.5

## 2012-08-18 LAB — HEMOGLOBIN A1C: Hgb A1c MFr Bld: 5.3 % (ref <5.7)

## 2012-08-18 LAB — LIPID PANEL
HDL: 45 mg/dL (ref >39)
Total CHOL/HDL Ratio: 4 Ratio

## 2012-08-18 LAB — CBC
HCT: 38.3 % (ref 36.0–46.0)
MCV: 80.8 fL (ref 78.0–100.0)
Platelets: 318 10*3/uL (ref 150–400)

## 2012-08-18 LAB — BASIC METABOLIC PANEL
BUN: 12 mg/dL (ref 6–23)
Chloride: 104 mEq/L (ref 96–112)
Creat: 0.81 mg/dL (ref 0.50–1.10)

## 2012-08-18 NOTE — Assessment & Plan Note (Signed)
Deteriorated. Patient re-educated about  the importance of commitment to a  minimum of 150 minutes of exercise per week. The importance of healthy food choices with portion control discussed. Encouraged to start a food diary, count calories and to consider  joining a support group. Sample diet sheets offered. Goals set by the patient for the next several months.    

## 2012-08-18 NOTE — Progress Notes (Signed)
  Subjective:    Patient ID: Karen Cox, female    DOB: 1961-05-19, 51 y.o.   MRN: [id number]  HPI  The PT is here for follow up and re-evaluation of chronic medical conditions, medication management and review of any available recent lab and radiology data.  Preventive health is updated, specifically  Cancer screening and Immunization.   Questions or concerns regarding consultations or procedures which the PT has had in the interim are  addressed. The PT denies any adverse reactions to current medications since the last visit.  C/o copious yellow vaginal d/c x 5 days, also c/o urinary frequency and burning for 3 days   Review of Systems See HPI Denies recent fever or chills. Denies sinus pressure, nasal congestion, ear pain or sore throat. Denies chest congestion, productive cough or wheezing. Denies chest pains, palpitations and leg swelling Denies abdominal pain, nausea, vomiting,diarrhea or constipation.    Denies joint pain, swelling and limitation in mobility. Denies headaches, seizures, numbness, or tingling. Denies  anxiety or insomnia.States she has noted herself  to be getting irritable more easily in recent times when people discuss her past Denies skin break down or rash.        Objective:   Physical Exam  Patient alert and oriented and in no cardiopulmonary distress.  HEENT: No facial asymmetry, EOMI, no sinus tenderness,  oropharynx pink and moist.  Neck supple no adenopathy.  Chest: Clear to auscultation bilaterally.  CVS: S1, S2 no murmurs, no S3.  ABD: Soft non tender. Bowel sounds normal. Pelvic: copious amount of yellow drainage from the vagina canal Ext: No edema  MS: Adequate ROM spine, shoulders, hips and knees.  Skin: Intact, no ulcerations or rash noted.  Psych: Good eye contact, normal affect. Memory intact not anxious or depressed appearing.  CNS: CN 2-12 intact, power, tone and sensation normal throughout.       Assessment &  Plan:

## 2012-08-18 NOTE — Patient Instructions (Addendum)
F/u in 4 month  Specimens are being sent for testing and you will be called with result so that you will get specific treatment.   You have gained 18 pounds. Please stop candy, deserts, sweet drinks and sodas, cut back on bread, so that you lose this weight  Re commit to walking for 30 minutes every day  Urine is being checked in office based on symptoms  Flu vaccine today  CBC, fasting lipid, HBA1C and HSV2 today and chem 7 today

## 2012-08-18 NOTE — Assessment & Plan Note (Signed)
Hyperlipidemia:Low fat diet discussed and encouraged.  Updated lan today

## 2012-08-18 NOTE — Assessment & Plan Note (Signed)
Uncontrolled in recent times, states people keep bringing up her past and this bothers her treated by mental health

## 2012-08-18 NOTE — Assessment & Plan Note (Signed)
Patient educated about the importance of limiting  Carbohydrate intake , the need to commit to daily physical activity for a minimum of 30 minutes , and to commit weight loss. The fact that changes in all these areas will reduce or eliminate all together the development of diabetes is stressed.   Updated HBa1C today

## 2012-08-18 NOTE — Assessment & Plan Note (Signed)
Controlled, no change in medication  

## 2012-08-18 NOTE — Assessment & Plan Note (Signed)
1 week h/o copious vaginal d/c , will treat based on report

## 2012-08-18 NOTE — Assessment & Plan Note (Signed)
3 day h/o frequency and dysuria will check CCUA in office

## 2012-08-19 ENCOUNTER — Other Ambulatory Visit: Payer: Self-pay | Admitting: Family Medicine

## 2012-08-19 LAB — URINE CULTURE
Colony Count: NO GROWTH
Organism ID, Bacteria: NO GROWTH

## 2012-08-20 ENCOUNTER — Other Ambulatory Visit: Payer: Self-pay

## 2012-08-20 MED ORDER — FLUCONAZOLE 150 MG PO TABS: ORAL_TABLET | ORAL | Status: DC

## 2012-08-20 MED ORDER — METRONIDAZOLE 500 MG PO TABS: 500.0000 mg | ORAL_TABLET | Freq: Two times a day (BID) | ORAL | Status: AC

## 2012-08-21 ENCOUNTER — Telehealth: Payer: Self-pay | Admitting: *Deleted

## 2012-08-21 ENCOUNTER — Other Ambulatory Visit: Payer: Self-pay

## 2012-08-21 DIAGNOSIS — Z139 Encounter for screening, unspecified: Secondary | ICD-10-CM

## 2012-08-21 NOTE — Telephone Encounter (Signed)
Karen Cox called today to be triaged for her colonoscopy. You can call her back at her home number or on her son's cell number, [phone]. Thanks.

## 2012-08-22 ENCOUNTER — Telehealth: Payer: Self-pay

## 2012-08-22 NOTE — Telephone Encounter (Signed)
See separate triage.  

## 2012-08-22 NOTE — Telephone Encounter (Signed)
MOVI PREP SPLIT DOSING, REGULAR BREAKFAST. CLEAR LIQUIDS AFTER 9 AM.  

## 2012-08-22 NOTE — Telephone Encounter (Signed)
Gastroenterology Pre-Procedure Form    Request Date: 08/21/2012     Requesting Physician: Dr. Lodema Hong     PATIENT INFORMATION:  Karen Cox is a 51 y.o., female (DOB=1961-01-23).  PROCEDURE: Procedure(s) requested: colonoscopy Procedure Reason: screening for colon cancer  PATIENT REVIEW QUESTIONS: The patient reports the following:   1. Diabetes Melitis: no 2. Joint replacements in the past 12 months: no 3. Major health problems in the past 3 months: no 4. Has an artificial valve or MVP:no 5. Has been advised in past to take antibiotics in advance of a procedure like teeth cleaning: no}    MEDICATIONS & ALLERGIES:    Patient reports the following regarding taking any blood thinners:   Plavix? no Aspirin?no Coumadin?  no  Patient confirms/reports the following medications:  Current Outpatient Prescriptions  Medication Sig Dispense Refill  . diphenhydrAMINE (SOMINEX) 25 MG tablet Take 25 mg by mouth at bedtime as needed.      . hydrocortisone cream 0.5 % Apply topically 2 (two) times daily. Apply sparingly to rash once daily as needed  30 g  1  . loratadine (CLARITIN) 10 MG tablet Take 10 mg by mouth daily. Take 2 tablets daily in the morning      . metroNIDAZOLE (FLAGYL) 500 MG tablet Take 1 tablet (500 mg total) by mouth 2 (two) times daily.  14 tablet  0  . Norgestimate-Ethinyl Estradiol Triphasic (ORTHO TRI-CYCLEN LO) 0.18/0.215/0.25 MG-25 MCG tablet Take 1 tablet by mouth daily.      Marland Kitchen PARoxetine (PAXIL) 40 MG tablet Take 20 mg by mouth every morning.        . fluconazole (DIFLUCAN) 150 MG tablet One tablet once daily  3 tablet  0    Patient confirms/reports the following allergies:  Allergies  Allergen Reactions  . Benadryl (Diphenhydramine Hcl) Other (See Comments)    Makes congestion worst   . Phenol-Glycerin Other (See Comments)    Makes congestion worst.   . Aspirin Rash    rash  . Latex Rash    Patient is appropriate to schedule for requested  procedure(s): yes  AUTHORIZATION INFORMATION Primary Insurance: ,ID #:  Group #:  Pre-Cert / Auth required:  Pre-Cert / Auth #:   Secondary Insurance:   ID #: Group #:   Pre-Cert / Auth #:   No orders of the defined types were placed in this encounter.    SCHEDULE INFORMATION: Procedure has been scheduled as follows:  Date:09/19/2012           Time:  8:30 AM Location: [facility] Short Stay  This Gastroenterology Pre-Precedure Form is being routed to the following provider(s) for review: Jonette Eva, MD

## 2012-08-25 MED ORDER — PEG-KCL-NACL-NASULF-NA ASC-C 100 G PO SOLR: 1.0000 | ORAL | Status: DC

## 2012-08-25 NOTE — Telephone Encounter (Signed)
Rx sent to [facility]. Instructions mailed to pt.  

## 2012-08-26 ENCOUNTER — Telehealth: Payer: Self-pay | Admitting: Family Medicine

## 2012-08-26 NOTE — Telephone Encounter (Signed)
I do not recommend these medis along with her prescription meds, no testing done with the FDA, pls let her know

## 2012-08-26 NOTE — Telephone Encounter (Signed)
I don't know anything about these pills. Wants to know if she should take them?

## 2012-08-27 NOTE — Telephone Encounter (Signed)
Patient aware. Wanted to know what else similar she could use. I advised yogurt

## 2012-09-03 ENCOUNTER — Encounter (HOSPITAL_COMMUNITY): Payer: Self-pay | Admitting: Pharmacy Technician

## 2012-09-18 MED ORDER — SODIUM CHLORIDE 0.45 % IV SOLN
INTRAVENOUS | Status: DC
  Administered 2012-09-19: 08:00:00 via INTRAVENOUS

## 2012-09-19 ENCOUNTER — Encounter (HOSPITAL_COMMUNITY): Payer: Self-pay | Admitting: *Deleted

## 2012-09-19 ENCOUNTER — Ambulatory Visit (HOSPITAL_COMMUNITY)
Admission: RE | Admit: 2012-09-19 | Discharge: 2012-09-19 | Disposition: A | Discharge status: Home / Self Care | Payer: Medicaid Other | Source: Ambulatory Visit | Attending: Gastroenterology | Admitting: Gastroenterology

## 2012-09-19 ENCOUNTER — Encounter (HOSPITAL_COMMUNITY)
Admission: RE | Disposition: A | Discharge status: Home / Self Care | Payer: Self-pay | Source: Ambulatory Visit | Attending: Gastroenterology

## 2012-09-19 DIAGNOSIS — Z1211 Encounter for screening for malignant neoplasm of colon: Secondary | ICD-10-CM | POA: Insufficient documentation

## 2012-09-19 DIAGNOSIS — K648 Other hemorrhoids: Secondary | ICD-10-CM

## 2012-09-19 DIAGNOSIS — Z139 Encounter for screening, unspecified: Secondary | ICD-10-CM

## 2012-09-19 DIAGNOSIS — D126 Benign neoplasm of colon, unspecified: Secondary | ICD-10-CM

## 2012-09-19 HISTORY — PX: COLONOSCOPY: SHX5424

## 2012-09-19 SURGERY — COLONOSCOPY
Anesthesia: Moderate Sedation

## 2012-09-19 MED ORDER — MIDAZOLAM HCL 5 MG/5ML IJ SOLN
INTRAMUSCULAR | Status: DC | PRN
  Administered 2012-09-19 (×3): 2 mg via INTRAVENOUS

## 2012-09-19 MED ORDER — MIDAZOLAM HCL 5 MG/5ML IJ SOLN
INTRAMUSCULAR | Status: AC
  Filled 2012-09-19: qty 10

## 2012-09-19 MED ORDER — MEPERIDINE HCL 100 MG/ML IJ SOLN
INTRAMUSCULAR | Status: AC
  Filled 2012-09-19: qty 1

## 2012-09-19 MED ORDER — MEPERIDINE HCL 100 MG/ML IJ SOLN
INTRAMUSCULAR | Status: DC | PRN
  Administered 2012-09-19: 25 mg via INTRAVENOUS
  Administered 2012-09-19: 50 mg via INTRAVENOUS
  Administered 2012-09-19: 25 mg via INTRAVENOUS

## 2012-09-19 MED ORDER — STERILE WATER FOR IRRIGATION IR SOLN
Status: DC | PRN
  Administered 2012-09-19: 09:00:00

## 2012-09-19 NOTE — H&P (Signed)
  Primary Care Physician:  Syliva Overman, MD Primary Gastroenterologist:  Dr. Darrick Penna  Pre-Procedure History & Physical: HPI:  Karen Cox is a 51 y.o. female here for COLON CANCER SCREENING.   Past Medical History  Diagnosis Date  . Allergy   . Depression   . GERD (gastroesophageal reflux disease)   . Anxiety   . Hyperlipidemia     Past Surgical History  Procedure Date  . No past surgeries     Prior to Admission medications   Medication Sig Start Date End Date Taking? Authorizing Provider  loratadine (CLARITIN) 10 MG tablet Take 20 mg by mouth daily.    Yes Historical Provider, MD  Norgestimate-Ethinyl Estradiol Triphasic (ORTHO TRI-CYCLEN LO) 0.18/0.215/0.25 MG-25 MCG tablet Take 1 tablet by mouth daily.   Yes Historical Provider, MD  PARoxetine (PAXIL) 30 MG tablet Take 30 mg by mouth every morning.   Yes Historical Provider, MD  triamcinolone cream (KENALOG) 0.1 % Apply 1 application topically 2 (two) times daily as needed. Rash.    Historical Provider, MD    Allergies as of 08/21/2012 - Review Complete 08/18/2012  Allergen Reaction Noted  . Benadryl (diphenhydramine hcl) Other (See Comments) 12/31/2010  . Phenol-glycerin Other (See Comments) 09/04/2011  . Aspirin Rash 05/08/2011  . Latex Rash 09/04/2011    Family History  Problem Relation Age of Onset  . Diabetes Father   . Heart disease Father   . Hypertension Father   . Colon cancer Neg Hx     History   Social History  . Marital Status: Single    Spouse Name: N/A    Number of Children: N/A  . Years of Education: N/A   Occupational History  . Not on file.   Social History Main Topics  . Smoking status: Former Smoker -- 0.2 packs/day for 3 years    Types: Cigarettes    Quit date: 09/03/1981  . Smokeless tobacco: Never Used  . Alcohol Use: No  . Drug Use: No  . Sexually Active: No   Other Topics Concern  . Not on file   Social History Narrative  . No narrative on file    Review of  Systems: See HPI, otherwise negative ROS   Physical Exam: BP 164/87  Pulse 117  Temp 97.8 F (36.6 C) (Oral)  Resp 24  Ht 5\' 4"  (1.626 m)  Wt 257 lb (116.574 kg)  BMI 44.11 kg/m2  SpO2 99%  LMP 08/31/2012 General:   Alert,  pleasant and cooperative in NAD Head:  Normocephalic and atraumatic. Neck:  Supple; Lungs:  Clear throughout to auscultation.    Heart:  Regular rate and rhythm. Abdomen:  Soft, nontender and nondistended. Normal bowel sounds, without guarding, and without rebound.   Neurologic:  Alert and  oriented x4;  grossly normal neurologically.  Impression/Plan:     SCREENING  Plan:  1. TCS TODAY

## 2012-09-19 NOTE — Op Note (Addendum)
[facility] [address] [place], [address]   COLONOSCOPY PROCEDURE REPORT  PATIENT: Karen, Cox  MR#: [id number] BIRTHDATE: [birth year] , 51  yrs. old GENDER: Female ENDOSCOPIST: Jonette Eva, MD REFERRED OZ:[id number] Lodema Hong, M.D.  Cyril Mourning PROCEDURE DATE:  09/19/2012 PROCEDURE:   Colonoscopy with cold biopsy polypectomy INDICATIONS:Average risk patient for colon cancer. MEDICATIONS: Demerol 100 mg IV and Versed 6 mg IV  DESCRIPTION OF PROCEDURE:    Physical exam was performed.  Informed consent was obtained from the patient after explaining the benefits, risks, and alternatives to procedure.  The patient was connected to monitor and placed in left lateral position. Continuous oxygen was provided by nasal cannula and IV medicine administered through an indwelling cannula.  After administration of sedation and rectal exam, the patients rectum was intubated and the Pentax Colonoscope [phone]  colonoscope was advanced under direct visualization to the cecum.  The scope was removed slowly by carefully examining the color, texture, anatomy, and integrity mucosa on the way out.  The patient was recovered in endoscopy and discharged home in satisfactory condition.       COLON FINDINGS: A sessile polyp measuring 3 mm in size was found at the hepatic flexure.  A polypectomy was performed with cold forceps.  , The colon mucosa was otherwise normal.  , The colon was redundant.  Manual abdominal counter-pressure was used to reach the cecum, and Small internal hemorrhoids were found.  PREP QUALITY: good. CECAL W/D TIME: 15 minutes  COMPLICATIONS: None  ENDOSCOPIC IMPRESSION: 1.   Sessile polyp measuring 3 mm in size was found at the hepatic flexure; polypectomy was performed with cold forceps 2.   The colon mucosa was otherwise normal 3.   The colon was redundant 4.   Small internal hemorrhoids   RECOMMENDATIONS: AWAIT BIOPSY HIGH FIBER  DIET TCS IN 10 YEARS WITH AN OVERTUBE       _______________________________ Rosalie DoctorJonette Eva, MD 09/19/2012 10:01 AM Revised: 09/19/2012 10:01 AM    PATIENT NAME:  Karen Cox MR#: [id number]

## 2012-09-23 ENCOUNTER — Encounter (HOSPITAL_COMMUNITY): Payer: Self-pay | Admitting: Gastroenterology

## 2012-09-25 ENCOUNTER — Telehealth: Payer: Self-pay | Admitting: Gastroenterology

## 2012-09-25 NOTE — Telephone Encounter (Signed)
Pt aware of results. I am going mail her a handout on a high fiber diet

## 2012-09-25 NOTE — Telephone Encounter (Signed)
Please call pt. She had simple adenoma removed from her colon. TCS in 10 years. High fiber diet.

## 2012-09-25 NOTE — Telephone Encounter (Signed)
Path faxed to PCP, recall made 

## 2012-10-20 ENCOUNTER — Telehealth: Payer: Self-pay | Admitting: Family Medicine

## 2012-10-20 NOTE — Telephone Encounter (Signed)
Will forward 

## 2012-10-21 NOTE — Telephone Encounter (Signed)
noted 

## 2012-12-15 ENCOUNTER — Ambulatory Visit: Payer: Medicaid Other | Admitting: Family Medicine

## 2012-12-16 ENCOUNTER — Telehealth: Payer: Self-pay | Admitting: Family Medicine

## 2012-12-21 NOTE — Telephone Encounter (Signed)
pls explain to pt this is not the protocol, Doctor does not determine coverage, her insurance, she needs to contact her insurance about that.Also help her to understand, PLEASE, that calling the office to discuss insurance coverage is not appropriate Pls let me know if I need to pursue this anymore following your discussion with her

## 2012-12-22 ENCOUNTER — Ambulatory Visit: Payer: Medicaid Other | Admitting: Family Medicine

## 2012-12-22 NOTE — Telephone Encounter (Signed)
Called patient to advise.  Patient confirmed understanding.

## 2012-12-24 ENCOUNTER — Telehealth: Payer: Self-pay | Admitting: Adult Health

## 2012-12-24 NOTE — Telephone Encounter (Signed)
Patient called staying she dropped birth control pill, told her to take the next pill in her pack

## 2013-01-05 ENCOUNTER — Ambulatory Visit (INDEPENDENT_AMBULATORY_CARE_PROVIDER_SITE_OTHER): Payer: Medicaid Other | Admitting: Family Medicine

## 2013-01-05 ENCOUNTER — Other Ambulatory Visit (HOSPITAL_COMMUNITY)
Admission: RE | Admit: 2013-01-05 | Discharge: 2013-01-05 | Disposition: A | Discharge status: Home / Self Care | Payer: Medicaid Other | Source: Ambulatory Visit | Attending: Family Medicine | Admitting: Family Medicine

## 2013-01-05 VITALS — BP 118/70 | HR 88 | Resp 18 | Ht 65.0 in | Wt 244.0 lb

## 2013-01-05 DIAGNOSIS — L259 Unspecified contact dermatitis, unspecified cause: Secondary | ICD-10-CM

## 2013-01-05 DIAGNOSIS — R51 Headache: Secondary | ICD-10-CM | POA: Insufficient documentation

## 2013-01-05 DIAGNOSIS — L678 Other hair color and hair shaft abnormalities: Secondary | ICD-10-CM

## 2013-01-05 DIAGNOSIS — J309 Allergic rhinitis, unspecified: Secondary | ICD-10-CM

## 2013-01-05 DIAGNOSIS — E8881 Metabolic syndrome: Secondary | ICD-10-CM

## 2013-01-05 DIAGNOSIS — J302 Other seasonal allergic rhinitis: Secondary | ICD-10-CM

## 2013-01-05 DIAGNOSIS — N62 Hypertrophy of breast: Secondary | ICD-10-CM | POA: Insufficient documentation

## 2013-01-05 DIAGNOSIS — F341 Dysthymic disorder: Secondary | ICD-10-CM

## 2013-01-05 DIAGNOSIS — N760 Acute vaginitis: Secondary | ICD-10-CM | POA: Insufficient documentation

## 2013-01-05 DIAGNOSIS — L680 Hirsutism: Secondary | ICD-10-CM

## 2013-01-05 DIAGNOSIS — F419 Anxiety disorder, unspecified: Secondary | ICD-10-CM

## 2013-01-05 DIAGNOSIS — E669 Obesity, unspecified: Secondary | ICD-10-CM

## 2013-01-05 DIAGNOSIS — R519 Headache, unspecified: Secondary | ICD-10-CM | POA: Insufficient documentation

## 2013-01-05 DIAGNOSIS — E785 Hyperlipidemia, unspecified: Secondary | ICD-10-CM

## 2013-01-05 DIAGNOSIS — Z113 Encounter for screening for infections with a predominantly sexual mode of transmission: Secondary | ICD-10-CM | POA: Insufficient documentation

## 2013-01-05 MED ORDER — ACETAMINOPHEN 325 MG PO TABS: ORAL_TABLET | ORAL | Status: AC

## 2013-01-05 MED ORDER — CALCIUM CARBONATE-VITAMIN D 500-200 MG-UNIT PO TABS: 1.0000 | ORAL_TABLET | Freq: Two times a day (BID) | ORAL | Status: DC

## 2013-01-05 MED ORDER — EFLORNITHINE HCL 13.9 % EX CREA: TOPICAL_CREAM | CUTANEOUS | Status: DC

## 2013-01-05 MED ORDER — THERAPEUTIC MULTIVIT/MINERAL PO TABS: 1.0000 | ORAL_TABLET | Freq: Every day | ORAL | Status: DC

## 2013-01-05 NOTE — Patient Instructions (Addendum)
F/u in 4 month  Congrats on weight loss.  You do not need aspirin till age 52  Start calcium with D and 1 multivitamin one daily, if not cvered centrum is good  Cream for facial hair.  You are referred for breast reduction we will call with appointment if the Provider takes medicaid  Vaginal swabs today

## 2013-01-05 NOTE — Assessment & Plan Note (Signed)
Improved. Pt applauded on succesful weight loss through lifestyle change, and encouraged to continue same. Weight loss goal set for the next several months.  

## 2013-01-05 NOTE — Progress Notes (Signed)
  Subjective:    Patient ID: Karen Cox, female    DOB: 07/13/1961, 52 y.o.   MRN: [id number]  HPI  The PT is here for follow up and re-evaluation of chronic medical conditions, medication management and review of any available recent lab and radiology data.  Preventive health is updated, specifically  Cancer screening and Immunization.   Questions or concerns regarding consultations or procedures which the PT has had in the interim are  addressed. The PT denies any adverse reactions to current medications since the last visit.  Wants referral for breast reduction wears 50 DD , has shoulder and upper back pain, wants female Doc C/o vaginal d/c which itches wants to be tested C/o facial hair and requests med Has done well with weight loss since last visit, she has been diligent with dietary change and exercise      Review of Systems See HPI Denies recent fever or chills. Denies sinus pressure, nasal congestion, ear pain or sore throat. Denies chest congestion, productive cough or wheezing. Denies chest pains, palpitations and leg swelling Denies abdominal pain, nausea, vomiting,diarrhea or constipation.   Denies dysuria, frequency, hesitancy or incontinence. Denies headaches, seizures, numbness, or tingling. Denies uncontrolled depression, anxiety or insomnia. Denies skin break down or rash.Controlled with topical stewroids        Objective:   Physical Exam  Patient alert and oriented and in no cardiopulmonary distress.  HEENT: No facial asymmetry, EOMI, no sinus tenderness,  oropharynx pink and moist.  Neck supple no adenopathy.  Chest: Clear to auscultation bilaterally.  CVS: S1, S2 no murmurs, no S3.  ABD: Soft non tender. Bowel sounds normal. Pelvic: thick white d/c , no cervical motion or adnexal tenderness Ext: No edema  MS: Adequate ROM spine, shoulders, hips and knees.  Skin: Intact, no ulcerations or rash noted.  Psych: Good eye contact, normal  affect. Memory intact not anxious or depressed appearing.  CNS: CN 2-12 intact, power, tone and sensation normal throughout.       Assessment & Plan:

## 2013-01-07 ENCOUNTER — Telehealth: Payer: Self-pay | Admitting: Family Medicine

## 2013-01-07 MED ORDER — METRONIDAZOLE 500 MG PO TABS: 500.0000 mg | ORAL_TABLET | Freq: Two times a day (BID) | ORAL | Status: AC

## 2013-01-07 MED ORDER — FLUCONAZOLE 150 MG PO TABS: 150.0000 mg | ORAL_TABLET | Freq: Once | ORAL | Status: DC

## 2013-01-07 NOTE — Telephone Encounter (Signed)
Addressed patient's concern.

## 2013-01-09 ENCOUNTER — Telehealth: Payer: Self-pay | Admitting: Family Medicine

## 2013-01-12 NOTE — Telephone Encounter (Signed)
trusdule does take medicaid and she refuses to go.

## 2013-01-18 ENCOUNTER — Encounter: Payer: Self-pay | Admitting: Family Medicine

## 2013-01-18 NOTE — Assessment & Plan Note (Signed)
Symptomatic, specimens sent for testing, will treat based on result

## 2013-01-18 NOTE — Assessment & Plan Note (Signed)
constipation

## 2013-01-18 NOTE — Assessment & Plan Note (Signed)
Controlled, no change in medication  

## 2013-01-18 NOTE — Assessment & Plan Note (Signed)
Controlled, no change in medication Followed by psych 

## 2013-01-18 NOTE — Assessment & Plan Note (Signed)
Increased triglycerides, low fat diet discussed esp reducing cheese,butter, egg yolk

## 2013-01-18 NOTE — Assessment & Plan Note (Signed)
Med prescribed to address the problem

## 2013-01-18 NOTE — Assessment & Plan Note (Signed)
Upper back pain and large breasts refer for surgical opinion, pt would benefit from surgery, coverage a challenge

## 2013-01-18 NOTE — Assessment & Plan Note (Signed)
Good weight loss in past 4 monht. HBA1C normal. Pt encouraged to work aggressively on lifestyle modification to improve health Will continue to monitor

## 2013-01-30 ENCOUNTER — Other Ambulatory Visit: Payer: Self-pay | Admitting: Family Medicine

## 2013-01-30 DIAGNOSIS — Z139 Encounter for screening, unspecified: Secondary | ICD-10-CM

## 2013-03-17 ENCOUNTER — Telehealth: Payer: Self-pay | Admitting: Family Medicine

## 2013-03-17 ENCOUNTER — Other Ambulatory Visit: Payer: Self-pay | Admitting: Family Medicine

## 2013-03-17 DIAGNOSIS — N62 Hypertrophy of breast: Secondary | ICD-10-CM

## 2013-03-17 NOTE — Telephone Encounter (Signed)
See previous similar message

## 2013-03-17 NOTE — Telephone Encounter (Signed)
Error/disregard

## 2013-03-17 NOTE — Telephone Encounter (Signed)
Referral put in , pls schedule appt and let her know

## 2013-03-18 ENCOUNTER — Telehealth: Payer: Self-pay | Admitting: Family Medicine

## 2013-03-23 ENCOUNTER — Ambulatory Visit (HOSPITAL_COMMUNITY): Payer: Medicaid Other

## 2013-04-09 ENCOUNTER — Telehealth: Payer: Self-pay | Admitting: *Deleted

## 2013-04-09 NOTE — Telephone Encounter (Signed)
?   Regarding pills, can take til 8.

## 2013-04-13 ENCOUNTER — Ambulatory Visit (HOSPITAL_COMMUNITY)
Admission: RE | Admit: 2013-04-13 | Discharge: 2013-04-13 | Disposition: A | Discharge status: Home / Self Care | Payer: Medicaid Other | Source: Ambulatory Visit | Attending: Family Medicine | Admitting: Family Medicine

## 2013-04-13 DIAGNOSIS — Z139 Encounter for screening, unspecified: Secondary | ICD-10-CM

## 2013-04-13 DIAGNOSIS — Z1231 Encounter for screening mammogram for malignant neoplasm of breast: Secondary | ICD-10-CM | POA: Insufficient documentation

## 2013-04-14 ENCOUNTER — Telehealth: Payer: Self-pay | Admitting: Family Medicine

## 2013-04-14 NOTE — Telephone Encounter (Signed)
Noted and agree, since they will not pay, I do not think we either need to schedule the appt for her or to make a referrral, let me know if otherwise  I do think thatDr Lita Mains sees medicaid pt's [facility]) you may want to check, she is NOT diabetic Pls also ask what is her specific eye complaint why she wants eye exam, this mAY help

## 2013-04-15 NOTE — Telephone Encounter (Signed)
No answer

## 2013-04-20 ENCOUNTER — Telehealth: Payer: Self-pay | Admitting: Family Medicine

## 2013-04-20 NOTE — Telephone Encounter (Signed)
If she insiists on changing , she does have the option to do so, I will refer, but if same thing happens again at the new office I will not keep referring herto different offices, pls let me know if she wants to go through with referral

## 2013-04-20 NOTE — Telephone Encounter (Signed)
pls document clinical details as to where is the "yeast infection" If it is gyne she is asking for , already goes to [facility] and they would be treating her appropriately, pls klet me know her clinical concern

## 2013-04-20 NOTE — Telephone Encounter (Signed)
pls explain to pt that in most practices the nurse practioner does routine csfre that she is requesting so same thing may likely happen in [place] office, also the nurse Practioner is supervised by an MD so her current nurse practioner seeing her is  Being supervised by an MD. Reason given to change is not a good long or short term plan

## 2013-04-20 NOTE — Telephone Encounter (Signed)
Patient states that she does not feel comfortable going [facility] because the physicians are female and she does not want to see a NP.  She is asking for a new referral.  Due to recurrent yeast infections and contraception management.

## 2013-04-20 NOTE — Telephone Encounter (Signed)
Patient called around and states that she wants to go to [facility] at the out patient OB GYN the number there is [phone] wants to go second week  of August Monday or Tuesday wants a early morning morning they take Medicaid

## 2013-04-21 ENCOUNTER — Other Ambulatory Visit: Payer: Self-pay | Admitting: Family Medicine

## 2013-04-21 DIAGNOSIS — N760 Acute vaginitis: Secondary | ICD-10-CM

## 2013-04-21 DIAGNOSIS — Z79890 Hormone replacement therapy (postmenopausal): Secondary | ICD-10-CM

## 2013-04-21 NOTE — Telephone Encounter (Signed)
She would like to go ahead with the referral to [facility] wants it the 31st of July in am or 2nd week of August (mon or tues) am

## 2013-04-21 NOTE — Telephone Encounter (Signed)
Wants you to call her back

## 2013-04-21 NOTE — Telephone Encounter (Signed)
Referral entered, pls schedule, pls see requested times in her tele message

## 2013-04-27 ENCOUNTER — Other Ambulatory Visit: Payer: Self-pay | Admitting: Adult Health

## 2013-05-04 ENCOUNTER — Ambulatory Visit: Payer: Self-pay | Admitting: Adult Health

## 2013-05-06 ENCOUNTER — Ambulatory Visit (INDEPENDENT_AMBULATORY_CARE_PROVIDER_SITE_OTHER): Payer: Medicaid Other | Admitting: Family Medicine

## 2013-05-06 ENCOUNTER — Encounter: Payer: Self-pay | Admitting: Family Medicine

## 2013-05-06 VITALS — BP 120/82 | HR 92 | Resp 16 | Ht 65.0 in | Wt 226.1 lb

## 2013-05-06 DIAGNOSIS — J309 Allergic rhinitis, unspecified: Secondary | ICD-10-CM

## 2013-05-06 DIAGNOSIS — N62 Hypertrophy of breast: Secondary | ICD-10-CM

## 2013-05-06 DIAGNOSIS — R5381 Other malaise: Secondary | ICD-10-CM

## 2013-05-06 DIAGNOSIS — E8881 Metabolic syndrome: Secondary | ICD-10-CM

## 2013-05-06 DIAGNOSIS — R5383 Other fatigue: Secondary | ICD-10-CM

## 2013-05-06 DIAGNOSIS — F329 Major depressive disorder, single episode, unspecified: Secondary | ICD-10-CM

## 2013-05-06 DIAGNOSIS — F341 Dysthymic disorder: Secondary | ICD-10-CM

## 2013-05-06 DIAGNOSIS — E785 Hyperlipidemia, unspecified: Secondary | ICD-10-CM

## 2013-05-06 DIAGNOSIS — J302 Other seasonal allergic rhinitis: Secondary | ICD-10-CM

## 2013-05-06 DIAGNOSIS — E669 Obesity, unspecified: Secondary | ICD-10-CM

## 2013-05-06 NOTE — Patient Instructions (Addendum)
F/u  Mid November, call if you need me before  I am happy that you have been able to see the skin specialist that you wanted to, as well as to the plastic surgeon. Please work at losing the weight recommended for your breast reduction surgery  Hope that your appointment with gynecology that you requested is going well  Fasting CBC, lipid, chem 7, TSh and HBA1C November 13 or after but at least 3 days before your next appt  Weight loss goal is 8 pounds

## 2013-05-06 NOTE — Progress Notes (Signed)
  Subjective:    Patient ID: Karen Cox, female    DOB: 21-Feb-1961, 52 y.o.   MRN: [id number]  HPI The PT is here for follow up and re-evaluation of chronic medical conditions, medication management and review of any available recent lab and radiology data.  Preventive health is updated, specifically  Cancer screening and Immunization.   Recently evaluated by plastic surgeon and needs to lose approx 40 pounds before breast reduction is considered. Has upcoming appt with female gyne , new Provider, stated concerns are ongoing contraceptive use and vaginal d/c States her new psych told her she has mild depression, and that she is not bipolar  The PT denies any adverse reactions to current medications since the last visit.  She intends to remain focused in her weight loss efforts      Review of Systems See HPI Denies recent fever or chills. Denies sinus pressure, nasal congestion, ear pain or sore throat. Denies chest congestion, productive cough or wheezing. Denies chest pains, palpitations and leg swelling Denies abdominal pain, nausea, vomiting,diarrhea or constipation.   Denies dysuria, frequency, hesitancy or incontinence. Denies joint pain, swelling and limitation in mobility. Denies headaches, seizures, numbness, or tingling. Denies uncontrolled  depression, anxiety or insomnia. Denies skin break down or rash.        Objective:   Physical Exam  Patient alert and oriented and in no cardiopulmonary distress.  HEENT: No facial asymmetry, EOMI, no sinus tenderness,  oropharynx pink and moist.  Neck supple no adenopathy.  Chest: Clear to auscultation bilaterally.  CVS: S1, S2 no murmurs, no S3.  ABD: Soft non tender. Bowel sounds normal.  Ext: No edema  MS: Adequate ROM spine, shoulders, hips and knees.  Skin: Intact, no ulcerations or rash noted.  Psych: Good eye contact, normal affect. Memory intact  anxious or depressed appearing.  CNS: CN 2-12 intact,  power, tone and sensation normal throughout.       Assessment & Plan:

## 2013-05-09 NOTE — Assessment & Plan Note (Signed)
Unchanged. Patient re-educated about  the importance of commitment to a  minimum of 150 minutes of exercise per week. The importance of healthy food choices with portion control discussed. Encouraged to start a food diary, count calories and to consider  joining a support group. Sample diet sheets offered. Goals set by the patient for the next several months.    

## 2013-05-09 NOTE — Assessment & Plan Note (Signed)
Low fat diet , reduction in butter and cheese encouraged, needs updated lab for next visit

## 2013-05-09 NOTE — Assessment & Plan Note (Signed)
Controlled, no change in medication  

## 2013-05-09 NOTE — Assessment & Plan Note (Signed)
Controlled, no change in medication Management through psych to continue

## 2013-05-09 NOTE — Assessment & Plan Note (Signed)
Essential for more weight loss to have surgery

## 2013-05-12 ENCOUNTER — Telehealth: Payer: Self-pay | Admitting: Family Medicine

## 2013-05-13 NOTE — Telephone Encounter (Signed)
I mailed her lab order

## 2013-05-20 ENCOUNTER — Ambulatory Visit: Payer: Self-pay | Admitting: Adult Health

## 2013-05-20 ENCOUNTER — Encounter: Payer: Medicaid Other | Admitting: Obstetrics & Gynecology

## 2013-05-29 ENCOUNTER — Encounter: Payer: Medicaid Other | Admitting: Obstetrics and Gynecology

## 2013-06-26 ENCOUNTER — Other Ambulatory Visit: Payer: Self-pay | Admitting: Adult Health

## 2013-07-23 ENCOUNTER — Telehealth: Payer: Self-pay | Admitting: Family Medicine

## 2013-07-23 ENCOUNTER — Encounter: Payer: Medicaid Other | Admitting: Obstetrics and Gynecology

## 2013-07-23 NOTE — Telephone Encounter (Signed)
Her pap is not due till 2016, so does not need one now, and she just transferred her care to [facility] office , so I am unclear as to what she really wants, let her know I will deal with this at the next visit please

## 2013-07-24 ENCOUNTER — Other Ambulatory Visit: Payer: Self-pay | Admitting: Family Medicine

## 2013-07-24 ENCOUNTER — Encounter: Payer: Medicaid Other | Admitting: Obstetrics & Gynecology

## 2013-07-24 DIAGNOSIS — N760 Acute vaginitis: Secondary | ICD-10-CM

## 2013-07-24 NOTE — Telephone Encounter (Signed)
Referral entered pls let her know 

## 2013-07-24 NOTE — Telephone Encounter (Signed)
She has no way to [facility] she needs to go to [facility] for a yeast infection she has appointment for 10.23.2014 she will not change places again unless she moves she stated

## 2013-07-27 ENCOUNTER — Telehealth: Payer: Self-pay | Admitting: Family Medicine

## 2013-07-27 NOTE — Telephone Encounter (Signed)
Attempted to return patient's call.  Not able to leave voicemail message.  Noted that patient has appt with gyn on 10/23

## 2013-07-28 ENCOUNTER — Telehealth: Payer: Self-pay | Admitting: Family Medicine

## 2013-07-28 NOTE — Telephone Encounter (Signed)
Has made 3 different calls to office for gyne eval for yeast,I have OK'd referrals as she requested. if she has not seen Doc and wants to come here for pelvic exam to check for yeast needs appt   made

## 2013-07-28 NOTE — Telephone Encounter (Signed)
noted 

## 2013-07-30 ENCOUNTER — Encounter: Payer: Self-pay | Admitting: *Deleted

## 2013-07-30 ENCOUNTER — Ambulatory Visit: Payer: Self-pay | Admitting: Adult Health

## 2013-07-31 ENCOUNTER — Telehealth: Payer: Self-pay | Admitting: *Deleted

## 2013-07-31 NOTE — Telephone Encounter (Signed)
Called and spoke with patient.  She states that she needs a referral to go to [facility].  Notified patient that she was a no show to appointment at that location scheduled for 10/23.  She states that she is aware of this and needs new referral.  Patient made aware that she should call [facility] to see if the will reschedule her, no further referral needed.

## 2013-07-31 NOTE — Telephone Encounter (Signed)
Karen Cox states she needs a referral sent to [facility]. "She can not make an ap for a pap with out a referral due to insurance." Her return phone number is [phone].

## 2013-08-05 ENCOUNTER — Ambulatory Visit (INDEPENDENT_AMBULATORY_CARE_PROVIDER_SITE_OTHER): Payer: Medicaid Other | Admitting: Adult Health

## 2013-08-05 ENCOUNTER — Encounter: Payer: Self-pay | Admitting: Adult Health

## 2013-08-05 VITALS — BP 120/84 | Ht 65.0 in | Wt 212.0 lb

## 2013-08-05 DIAGNOSIS — N951 Menopausal and female climacteric states: Secondary | ICD-10-CM

## 2013-08-05 DIAGNOSIS — B379 Candidiasis, unspecified: Secondary | ICD-10-CM

## 2013-08-05 DIAGNOSIS — N898 Other specified noninflammatory disorders of vagina: Secondary | ICD-10-CM

## 2013-08-05 DIAGNOSIS — L293 Anogenital pruritus, unspecified: Secondary | ICD-10-CM

## 2013-08-05 HISTORY — DX: Other specified noninflammatory disorders of vagina: N89.8

## 2013-08-05 HISTORY — DX: Menopausal and female climacteric states: N95.1

## 2013-08-05 HISTORY — DX: Candidiasis, unspecified: B37.9

## 2013-08-05 LAB — POCT WET PREP (WET MOUNT)

## 2013-08-05 MED ORDER — FLUCONAZOLE 150 MG PO TABS: ORAL_TABLET | ORAL | Status: DC

## 2013-08-05 NOTE — Patient Instructions (Addendum)
Vaginitis Vaginitis in a soreness, swelling and redness (inflammation) of the vagina and vulva. This is not a sexually transmitted infection.  CAUSES  Yeast vaginitis is caused by yeast (candida) that is normally found in your vagina. With a yeast infection, the candida has over grown in number to a point that upsets the chemical balance. SYMPTOMS   White thick vaginal discharge.   Swelling, itching, redness and irritation of the vagina and possibly the lips of the vagina (vulva).   Burning or painful urination.   Painful intercourse.  HOME CARE INSTRUCTIONS   Finish all medication as prescribed.   Do not have sex until treatment is completed or instructed by your healthcare giver.   Take warm sitz baths.   Do not douche.   Do not use tampons, especially scented ones.   Wear cotton underwear.   Avoid tight pants and panty hose.   Tell your sexual partner that you have a yeast infection. They should go to their caregiver if they have symptoms such as mild rash or itching.   Your sexual partner should be treated if your infection is difficult to eliminate.   Practice safer sex. Use condoms.   Some vaginal medications cause latex condoms to fail. Ask your caregiver this.  SEEK MEDICAL CARE IF:   You develop a fever.   The infection is getting worse after 2 days of treatment.   The infection is not getting better after 3 days of treatment.   You develop blisters in or around your vagina.   You develop vaginal bleeding, and it is not your menstrual period.   You have pain when you urinate.   You develop intestinal problems.   You have pain with sexual intercourse.  Document Released: 11/01/2004 Document Revised: 09/13/2011 Document Reviewed: 06/09/2009 [facility] Patient Information 2012 [place], [place]. Follow labs by phone Perimenopause Perimenopause is the time when your body begins to move into the menopause (no menstrual period for 12 straight months). It is a  natural process. Perimenopause can begin 2 to 8 years before the menopause and usually lasts for one year after the menopause. During this time, your ovaries may or may not produce an egg. The ovaries vary in their production of estrogen and progesterone hormones each month. This can cause irregular menstrual periods, difficulty in getting pregnant, vaginal bleeding between periods and uncomfortable symptoms. CAUSES  Irregular production of the ovarian hormones, estrogen and progesterone, and not ovulating every month.  Other causes include:  Tumor of the pituitary gland in the brain.  Medical disease that affects the ovaries.  Radiation treatment.  Chemotherapy.  Unknown causes.  Heavy smoking and excessive alcohol intake can bring on perimenopause sooner. SYMPTOMS   Hot flashes.  Night sweats.  Irregular menstrual periods.  Decrease sex drive.  Vaginal dryness.  Headaches.  Mood swings.  Depression.  Memory problems.  Irritability.  Tiredness.  Weight gain.  Trouble getting pregnant.  The beginning of losing bone cells (osteoporosis).  The beginning of hardening of the arteries (atherosclerosis). DIAGNOSIS  Your caregiver will make a diagnosis by analyzing your age, menstrual history and your symptoms. They will do a physical exam noting any changes in your body, especially your female organs. Female hormone tests may or may not be helpful depending on the amount and when you produce the female hormones. However, other hormone tests may be helpful (ex. thyroid hormone) to rule out other problems. TREATMENT  The decision to treat during the perimenopause should be made by you  and your caregiver depending on how the symptoms are affecting you and your life style. There are various treatments available such as:  Treating individual symptoms with a specific medication for that symptom (ex. tranquilizer for depression).  Herbal medications that can help specific  symptoms.  Counseling.  Group therapy.  No treatment. HOME CARE INSTRUCTIONS   Before seeing your caregiver, make a list of your menstrual periods (when the occur, how heavy they are, how long between periods and how long they last), your symptoms and when they started.  Take the medication as recommended by your caregiver.  Sleep and rest.  Exercise.  Eat a diet that contains calcium (good for your bones) and soy (acts like estrogen hormone).  Do not smoke.  Avoid alcoholic beverages.  Taking vitamin E may help in certain cases.  Take calcium and vitamin D supplements to help prevent bone loss.  Group therapy is sometimes helpful.  Acupuncture may help in some cases. SEEK MEDICAL CARE IF:   You have any of the above and want to know if it is perimenopause.  You want advice and treatment for any of your symptoms mentioned above.  You need a referral to a specialist (gynecologist, psychiatrist or psychologist). SEEK IMMEDIATE MEDICAL CARE IF:   You have vaginal bleeding.  Your period lasts longer than 8 days.  You periods are recurring sooner than 21 days.  You have bleeding after intercourse.  You have severe depression.  You have pain when you urinate.  You have severe headaches.  You develop vision problems. Document Released: 11/01/2004 Document Revised: 12/17/2011 Document Reviewed: 07/22/2008 [facility] Patient Information 2014 [place], [place].

## 2013-08-05 NOTE — Progress Notes (Signed)
Subjective:     Patient ID: Gennaro Africa, female   DOB: [birth year], 52 y.o.   MRN: [id number]  HPI Damaris is 52 year old black female complaining of vaginal itch and has been off OCs since mid July wants [facility] checked.Not having sex.Sees Dr Lodema Hong in near future.  Review of Systems See HPI Reviewed past medical,surgical, social and family history. Reviewed medications and allergies.     Objective:   Physical Exam BP 120/84  Ht 5\' 5"  (1.651 m)  Wt 212 lb (96.163 kg)  BMI 35.28 kg/m2  LMP 06/27/2013   Skin warm and dry.Pelvic: external genitalia is normal in appearance, vagina: white clumpy discharge without odor, cervix:smooth and bulbous, uterus: normal size, shape and contour, non tender, no masses felt, adnexa: no masses or tenderness noted. Wet prep: + for yeast and +WBCs.  Assessment:     Vaginal itch  Yeast Peri menopausal    Plan:     Check [facility] Rx diflucan 150 mg # 2 1 now and 1 in 3 days with 1 refill Follow up prn  Will discuss lab in am by phone   Review handouts on yeast and peri menopause

## 2013-08-06 ENCOUNTER — Telehealth: Payer: Self-pay | Admitting: Adult Health

## 2013-08-06 LAB — FOLLICLE STIMULATING HORMONE: FSH: 42.4 m[IU]/mL

## 2013-08-06 NOTE — Telephone Encounter (Signed)
Pt aware FSH 42.4

## 2013-08-13 ENCOUNTER — Other Ambulatory Visit: Payer: Self-pay

## 2013-08-18 ENCOUNTER — Encounter: Payer: Self-pay | Admitting: Family Medicine

## 2013-08-18 ENCOUNTER — Ambulatory Visit (INDEPENDENT_AMBULATORY_CARE_PROVIDER_SITE_OTHER): Payer: Medicaid Other | Admitting: Family Medicine

## 2013-08-18 VITALS — BP 128/82 | HR 83 | Resp 16 | Ht 65.0 in | Wt 215.4 lb

## 2013-08-18 DIAGNOSIS — E669 Obesity, unspecified: Secondary | ICD-10-CM

## 2013-08-18 DIAGNOSIS — E785 Hyperlipidemia, unspecified: Secondary | ICD-10-CM

## 2013-08-18 DIAGNOSIS — F341 Dysthymic disorder: Secondary | ICD-10-CM

## 2013-08-18 DIAGNOSIS — F329 Major depressive disorder, single episode, unspecified: Secondary | ICD-10-CM

## 2013-08-18 DIAGNOSIS — J309 Allergic rhinitis, unspecified: Secondary | ICD-10-CM

## 2013-08-18 DIAGNOSIS — N62 Hypertrophy of breast: Secondary | ICD-10-CM

## 2013-08-18 DIAGNOSIS — J302 Other seasonal allergic rhinitis: Secondary | ICD-10-CM

## 2013-08-18 DIAGNOSIS — Z23 Encounter for immunization: Secondary | ICD-10-CM

## 2013-08-18 DIAGNOSIS — L259 Unspecified contact dermatitis, unspecified cause: Secondary | ICD-10-CM

## 2013-08-18 LAB — CBC WITH DIFFERENTIAL/PLATELET
Basophils Absolute: 0 10*3/uL (ref 0.0–0.1)
Basophils Relative: 0 % (ref 0–1)
Eosinophils Absolute: 0.1 10*3/uL (ref 0.0–0.7)
Eosinophils Relative: 2 % (ref 0–5)
HCT: 37.3 % (ref 36.0–46.0)
Lymphocytes Relative: 37 % (ref 12–46)
MCH: 27.7 pg (ref 26.0–34.0)
MCHC: 33.2 g/dL (ref 30.0–36.0)
MCV: 83.4 fL (ref 78.0–100.0)
Monocytes Absolute: 0.3 10*3/uL (ref 0.1–1.0)
RDW: 13.8 % (ref 11.5–15.5)

## 2013-08-18 LAB — LIPID PANEL
LDL Cholesterol: 132 mg/dL — ABNORMAL HIGH (ref 0–99)
Triglycerides: 73 mg/dL (ref <150)

## 2013-08-18 LAB — BASIC METABOLIC PANEL
BUN: 10 mg/dL (ref 6–23)
Calcium: 9.7 mg/dL (ref 8.4–10.5)
Chloride: 108 mEq/L (ref 96–112)
Creat: 0.77 mg/dL (ref 0.50–1.10)

## 2013-08-18 LAB — HEMOGLOBIN A1C: Mean Plasma Glucose: 94 mg/dL (ref <117)

## 2013-08-18 LAB — TSH: TSH: 1.898 u[IU]/mL (ref 0.350–4.500)

## 2013-08-18 NOTE — Progress Notes (Signed)
  Subjective:    Patient ID: Karen Cox, female    DOB: 16-Dec-1960, 52 y.o.   MRN: [id number]  HPI The PT is here for follow up and re-evaluation of chronic medical conditions, medication management and review of any available recent lab and radiology data.  Preventive health is updated, specifically  Cancer screening and Immunization.   Questions or concerns regarding consultations or procedures which the PT has had in the interim are  Addressed.Seen regularly by gyne for vaginal itch and d/c and perimenopause The PT denies any adverse reactions to current medications since the last visit.  There are no new concerns.  There are no specific complaints       Review of Systems    See HPI Denies recent fever or chills. Denies sinus pressure, nasal congestion, ear pain or sore throat. Denies chest congestion, productive cough or wheezing. Denies chest pains, palpitations and leg swelling Denies abdominal pain, nausea, vomiting,diarrhea or constipation.   Denies dysuria, frequency, hesitancy or incontinence. Denies joint pain, swelling and limitation in mobility. Denies headaches, seizures, numbness, or tingling. Denies depression, anxiety or insomnia. Denies skin break down or rash.     Objective:   Physical Exam  Patient alert and oriented and in no cardiopulmonary distress.  HEENT: No facial asymmetry, EOMI, no sinus tenderness,  oropharynx pink and moist.  Neck supple no adenopathy.  Chest: Clear to auscultation bilaterally.  CVS: S1, S2 no murmurs, no S3.  ABD: Soft non tender. Bowel sounds normal.  Ext: No edema  MS: Adequate ROM spine, shoulders, hips and knees.  Skin: Intact, no ulcerations or rash noted.  Psych: Good eye contact, normal affect. Memory intact not anxious or depressed appearing.  CNS: CN 2-12 intact, power, tone and sensation normal throughout.       Assessment & Plan:

## 2013-08-18 NOTE — Patient Instructions (Addendum)
F/u in 4 month, call if you need me before  CONGRATS on 12  Pound weight loss, keep it up!  Labs today , use sheet you have

## 2013-08-21 ENCOUNTER — Other Ambulatory Visit: Payer: Self-pay | Admitting: Adult Health

## 2013-08-23 NOTE — Assessment & Plan Note (Signed)
Controlled, no change in medication  

## 2013-08-23 NOTE — Assessment & Plan Note (Signed)
attempoting sufficient weight loss to qualify for surgery, doing well

## 2013-08-23 NOTE — Assessment & Plan Note (Signed)
Improved. Pt applauded on succesful weight loss through lifestyle change, and encouraged to continue same. Weight loss goal set for the next several months.  

## 2013-08-23 NOTE — Assessment & Plan Note (Signed)
Improved, on no psych meds though followed by psychiatry

## 2013-08-23 NOTE — Assessment & Plan Note (Signed)
Less severe, med as needed only

## 2013-08-23 NOTE — Assessment & Plan Note (Signed)
Deteriorated Hyperlipidemia:Low fat diet discussed and encouraged.   

## 2013-08-24 ENCOUNTER — Telehealth: Payer: Self-pay

## 2013-08-24 NOTE — Telephone Encounter (Signed)
Pt. Called requesting a nurse to call her back. Did not state reason for call.

## 2013-08-25 NOTE — Telephone Encounter (Signed)
Called pt. No answer. Left message stating we are returning her call and she can call clinic during business hours 8-4. Pt. Has appointment for hormone replacement and is an established patient at [facility]-- unsure of why she is calling here.

## 2013-08-26 ENCOUNTER — Ambulatory Visit: Payer: Medicaid Other | Admitting: Adult Health

## 2013-08-26 NOTE — Telephone Encounter (Signed)
Called Karen Cox and she states she already has an appointment with [facility] and she wanted to make sure they could place her on hormone replacement theraphy, if not, she wanted to make an appt with [place]. I explained to her that [facility] is an ob/gyn and they can order hormone replacement theraphy, after the doctor reviews her medical history and symptoms.  Questions answered.

## 2013-08-27 ENCOUNTER — Telehealth: Payer: Self-pay | Admitting: *Deleted

## 2013-08-27 NOTE — Telephone Encounter (Signed)
Voice mailbox not set up @ 10:26 am. [employer]

## 2013-08-27 NOTE — Telephone Encounter (Signed)
Voice mailbox not set up at 3:31pm. JSY

## 2013-09-01 ENCOUNTER — Encounter: Payer: Self-pay | Admitting: Adult Health

## 2013-09-01 ENCOUNTER — Ambulatory Visit (INDEPENDENT_AMBULATORY_CARE_PROVIDER_SITE_OTHER): Payer: Medicaid Other | Admitting: Adult Health

## 2013-09-01 VITALS — BP 140/90 | Ht 64.0 in | Wt 210.0 lb

## 2013-09-01 DIAGNOSIS — N951 Menopausal and female climacteric states: Secondary | ICD-10-CM

## 2013-09-01 DIAGNOSIS — Z79890 Hormone replacement therapy (postmenopausal): Secondary | ICD-10-CM

## 2013-09-01 DIAGNOSIS — R232 Flushing: Secondary | ICD-10-CM

## 2013-09-01 HISTORY — DX: Flushing: R23.2

## 2013-09-01 MED ORDER — CONJ ESTROG-MEDROXYPROGEST ACE 0.625-2.5 MG PO TABS: 1.0000 | ORAL_TABLET | Freq: Every day | ORAL | Status: DC

## 2013-09-01 NOTE — Patient Instructions (Signed)

## 2013-09-01 NOTE — Telephone Encounter (Signed)
Spoke with pt. Has an appt today with Victorino Dike. JSY

## 2013-09-01 NOTE — Progress Notes (Signed)
Subjective:     Patient ID: Karen Cox, female   DOB: 04/26/1961, 53 y.o.   MRN: [id number]  HPI Karen Cox is a 52 year old black female who had a FSH of 42.4 last month after being off OCs x 1 month, she is complaining of vaginal dryness with itch occasionally and hot flashes now and wants to being HRT, which she has researched on computer.  Review of Systems See HPI Reviewed past medical,surgical, social and family history. Reviewed medications and allergies.     Objective:   Physical Exam BP 140/90  Ht 5\' 4"  (1.626 m)  Wt 210 lb (95.255 kg)  BMI 36.03 kg/m2  LMP 06/27/2013   Reviewed HRT risk and benefits and pt is addiment that she wants HRT and she wants a pill form. She says she is trying to lose weight to get breast reduction.  Assessment:     HRT    Plan:     Rx prempro .625/2.5 mg #30 1 daily with 3 refills Review handout on HRT Follow up ion 3 months

## 2013-09-02 ENCOUNTER — Telehealth: Payer: Self-pay | Admitting: Adult Health

## 2013-09-02 NOTE — Telephone Encounter (Signed)
No voicemail set up 

## 2013-09-10 ENCOUNTER — Telehealth: Payer: Self-pay | Admitting: *Deleted

## 2013-09-10 NOTE — Telephone Encounter (Signed)
Pt asking how does she take Prempro. Informed pt to take Prempro by mouth daily. Pt verbalized understanding.

## 2013-09-11 ENCOUNTER — Encounter: Payer: Self-pay | Admitting: Adult Health

## 2013-09-11 ENCOUNTER — Telehealth: Payer: Self-pay | Admitting: Adult Health

## 2013-09-11 NOTE — Telephone Encounter (Signed)
busy

## 2013-09-11 NOTE — Telephone Encounter (Signed)
Has prempro had question if ok to take at night, it is fine

## 2013-09-14 MED ORDER — FLUCONAZOLE 150 MG PO TABS: ORAL_TABLET | ORAL | Status: DC

## 2013-09-14 NOTE — Telephone Encounter (Signed)
Refilled diflucan 

## 2013-09-15 ENCOUNTER — Other Ambulatory Visit: Payer: Self-pay | Admitting: *Deleted

## 2013-09-15 NOTE — Telephone Encounter (Signed)
Pt already has a refill on the diflucan.

## 2013-09-22 ENCOUNTER — Telehealth: Payer: Self-pay | Admitting: Adult Health

## 2013-09-22 NOTE — Telephone Encounter (Signed)
Pt asking ok to take prempro at 10:30 at night. Pt informed ok to take at that time of night.

## 2013-09-28 ENCOUNTER — Telehealth: Payer: Self-pay | Admitting: Adult Health

## 2013-09-28 NOTE — Telephone Encounter (Signed)
Pt states started vaginal bleeding on this past Friday thru today. Per Cyril Mourning, NP pt to continue prempro as prescribed and an appt was made for Monday, Dec. 29, 2014 for u/s and to see Victorino Dike. Pt verbalized understanding.

## 2013-10-05 ENCOUNTER — Ambulatory Visit: Payer: Medicaid Other | Admitting: Adult Health

## 2013-10-05 ENCOUNTER — Other Ambulatory Visit: Payer: Medicaid Other

## 2013-10-09 ENCOUNTER — Ambulatory Visit: Payer: Medicaid Other | Admitting: Adult Health

## 2013-10-09 ENCOUNTER — Other Ambulatory Visit: Payer: Medicaid Other

## 2013-10-14 ENCOUNTER — Other Ambulatory Visit: Payer: Self-pay | Admitting: Adult Health

## 2013-10-14 DIAGNOSIS — N950 Postmenopausal bleeding: Secondary | ICD-10-CM

## 2013-10-16 ENCOUNTER — Other Ambulatory Visit: Payer: Medicaid Other

## 2013-10-16 ENCOUNTER — Ambulatory Visit: Payer: Medicaid Other | Admitting: Adult Health

## 2013-10-23 ENCOUNTER — Ambulatory Visit: Payer: Medicaid Other | Admitting: Adult Health

## 2013-10-23 ENCOUNTER — Other Ambulatory Visit: Payer: Medicaid Other

## 2013-11-06 ENCOUNTER — Ambulatory Visit: Payer: Medicaid Other | Admitting: Adult Health

## 2013-11-06 ENCOUNTER — Other Ambulatory Visit: Payer: Medicaid Other

## 2013-11-16 ENCOUNTER — Ambulatory Visit (INDEPENDENT_AMBULATORY_CARE_PROVIDER_SITE_OTHER): Payer: Medicaid Other

## 2013-11-16 ENCOUNTER — Encounter: Payer: Self-pay | Admitting: Adult Health

## 2013-11-16 ENCOUNTER — Ambulatory Visit (INDEPENDENT_AMBULATORY_CARE_PROVIDER_SITE_OTHER): Payer: Medicaid Other | Admitting: Adult Health

## 2013-11-16 VITALS — BP 126/78 | Ht 64.0 in | Wt 208.0 lb

## 2013-11-16 DIAGNOSIS — B379 Candidiasis, unspecified: Secondary | ICD-10-CM

## 2013-11-16 DIAGNOSIS — N950 Postmenopausal bleeding: Secondary | ICD-10-CM

## 2013-11-16 DIAGNOSIS — Z79890 Hormone replacement therapy (postmenopausal): Secondary | ICD-10-CM

## 2013-11-16 HISTORY — DX: Postmenopausal bleeding: N95.0

## 2013-11-16 LAB — POCT WET PREP (WET MOUNT): WBC WET PREP: NEGATIVE

## 2013-11-16 MED ORDER — FLUCONAZOLE 150 MG PO TABS: ORAL_TABLET | ORAL | Status: DC

## 2013-11-16 NOTE — Progress Notes (Signed)
Subjective:     Patient ID: Karen Cox, female   DOB: 25-Oct-1960, 53 y.o.   MRN: [id number]  HPI Karen Cox is a 53 year old black female in for [place] for PMB on HRT.Her hot flashes are much better and she thinks she still has yeast.She had bleeding in December and January.   Review of Systems See HPI Reviewed past medical,surgical, social and family history. Reviewed medications and allergies.     Objective:   Physical Exam BP 126/78  Ht 5\' 4"  (1.626 m)  Wt 208 lb (94.348 kg)  BMI 35.69 kg/m2  LMP 06/27/2013   Skin warm and dry.Pelvic: external genitalia is normal in appearance, vagina: white discharge without odor, cervix:smooth and bulbous, uterus: normal size, shape and contour, non tender, no masses felt, adnexa: no masses or tenderness noted. Wet prep: + for yeast Reviewed [place] with pt Uterus 8.4 x 4.5 x 4.3 cm, anteverted  Endometrium 9.8 mm, No obvious "definite"mass noted although difficult to evaluate  Right ovary 2.5 x 1.7 x 1.6 cm,  Left ovary 2.2 x 1.5 x 1.5 cm,  No free fluid or adnexal masses noted within pelvis  Technician Comments:  Anteverted uterus noted, ENDOM=9.49mm no obvious mass noted within cavity although difficult to evaluate well, bilateral adnexa/ovaries appears wnl, no free fluid or adnexal masses noted, Pt very anxious during ultrasound although no tenderness noted by patient during exam  Discussed with Dr Glo Herring and he feels an endometrial biopsy should be done to assess tissue in endometrium.   Assessment:     PMB HRT Yeast infection    Plan:     Refilled diflucan Return in 10 days for endo biopsy with Dr Elonda Husky Continue other meds Review handout on endometrial biopsy

## 2013-11-16 NOTE — Patient Instructions (Signed)
Endometrial Biopsy Endometrial biopsy is a procedure in which a tissue sample is taken from inside the uterus. The tissue sample is then looked at under a microscope to see if the tissue is normal or abnormal. The endometrium is the lining of the uterus. This procedure helps determine where you are in your menstrual cycle and how hormone levels are affecting the lining of the uterus. This procedure may also be used to evaluate uterine bleeding or to diagnose endometrial cancer, tuberculosis, polyps, or inflammatory conditions.  LET [facility] CARE PROVIDER KNOW ABOUT:  Any allergies you have.  All medicines you are taking, including vitamins, herbs, eye drops, creams, and over-the-counter medicines.  Previous problems you or members of your family have had with the use of anesthetics.  Any blood disorders you have.  Previous surgeries you have had.  Medical conditions you have.  Possibility of pregnancy. RISKS AND COMPLICATIONS Generally, this is a safe procedure. However, as with any procedure, complications can occur. Possible complications include:  Bleeding.  Pelvic infection.  Puncture of the uterine wall with the biopsy device (rare). BEFORE THE PROCEDURE   Keep a record of your menstrual cycles as directed by your health care provider. You may need to schedule your procedure for a specific time in your cycle.  You may want to bring a sanitary pad to wear home after the procedure.  Arrange for someone to drive you home after the procedure if you will be given a medicine to help you relax (sedative). PROCEDURE   You may be given a sedative to relax you.  You will lie on an exam table with your feet and legs supported as in a pelvic exam.  Your health care provider will insert an instrument (speculum) into your vagina to see your cervix.  Your cervix will be cleansed with an antiseptic solution. A medicine (local anesthetic) will be used to numb the cervix.  A forceps  instrument (tenaculum) will be used to hold your cervix steady for the biopsy.  A thin, rodlike instrument (uterine sound) will be inserted through your cervix to determine the length of your uterus and the location where the biopsy sample will be removed.  A thin, flexible tube (catheter) will be inserted through your cervix and into the uterus. The catheter is used to collect the biopsy sample from your endometrial tissue.  The catheter and speculum will then be removed, and the tissue sample will be sent to a lab for examination. AFTER THE PROCEDURE  You will rest in a recovery area until you are ready to go home.  You may have mild cramping and a small amount of vaginal bleeding for a few days after the procedure. This is normal.  Make sure you find out how to get your test results. Document Released: 01/25/2005 Document Revised: 05/27/2013 Document Reviewed: 03/11/2013 [facility] Patient Information 2014 [place], [place]. Return in 10 days for biopsy with Dr Elonda Husky

## 2013-11-17 ENCOUNTER — Telehealth: Payer: Self-pay | Admitting: Adult Health

## 2013-11-17 NOTE — Telephone Encounter (Signed)
Pt aware that I spoke with her daughter Lorrine [phone] about her up coming endo biopsy and tried to explain it to her.

## 2013-11-26 ENCOUNTER — Ambulatory Visit (INDEPENDENT_AMBULATORY_CARE_PROVIDER_SITE_OTHER): Payer: Medicaid Other | Admitting: Obstetrics & Gynecology

## 2013-11-26 ENCOUNTER — Encounter: Payer: Self-pay | Admitting: Obstetrics & Gynecology

## 2013-11-26 ENCOUNTER — Other Ambulatory Visit: Payer: Self-pay | Admitting: Obstetrics & Gynecology

## 2013-11-26 VITALS — Ht 63.2 in | Wt 204.0 lb

## 2013-11-26 DIAGNOSIS — N950 Postmenopausal bleeding: Secondary | ICD-10-CM

## 2013-11-26 DIAGNOSIS — N840 Polyp of corpus uteri: Secondary | ICD-10-CM

## 2013-11-26 NOTE — Progress Notes (Signed)
Patient ID: Karen Cox, female   DOB: [birth year], 53 y.o.   MRN: [id number] On prempro had some PMB x 2 months ES 9.8 homogenous  EMB performed today and follow up 1 week  Past Medical History  Diagnosis Date  . Allergy   . Depression   . GERD (gastroesophageal reflux disease)   . Anxiety   . Hyperlipidemia   . Vaginal itching 08/05/2013  . Yeast infection 08/05/2013  . Peri-menopause 08/05/2013  . Hot flashes 09/01/2013  . PMB (postmenopausal bleeding) 11/16/2013    Past Surgical History  Procedure Laterality Date  . No past surgeries    . Colonoscopy  09/19/2012    Procedure: COLONOSCOPY;  Surgeon: Danie Binder, MD;  Location: [facility];  Service: Endoscopy;  Laterality: N/A;  8:30 AM    OB History   Grav Para Term Preterm Abortions TAB SAB Ect Mult Living   1 1 1       1       Allergies  Allergen Reactions  . Benadryl [Diphenhydramine Hcl] Other (See Comments)    Makes congestion worst   . Phenol-Glycerin Other (See Comments)    Makes congestion worst.   . Aspirin Rash    rash  . Latex Rash    History   Social History  . Marital Status: Single    Spouse Name: N/A    Number of Children: N/A  . Years of Education: N/A   Social History Main Topics  . Smoking status: Former Smoker -- 0.25 packs/day for 3 years    Types: Cigarettes    Quit date: 09/03/1981  . Smokeless tobacco: Never Used  . Alcohol Use: No  . Drug Use: No  . Sexual Activity: No   Other Topics Concern  . None   Social History Narrative  . None    Family History  Problem Relation Age of Onset  . Diabetes Father   . Heart disease Father   . Hypertension Father   . Colon cancer Neg Hx   . Cancer Neg Hx   . Diabetes Maternal Uncle   . Anemia Maternal Uncle   . Arthritis Maternal Uncle

## 2013-12-03 ENCOUNTER — Ambulatory Visit: Payer: Medicaid Other | Admitting: Obstetrics & Gynecology

## 2013-12-11 ENCOUNTER — Ambulatory Visit: Payer: Medicaid Other | Admitting: Obstetrics & Gynecology

## 2013-12-16 ENCOUNTER — Ambulatory Visit: Payer: Medicaid Other | Admitting: Family Medicine

## 2013-12-17 ENCOUNTER — Ambulatory Visit (INDEPENDENT_AMBULATORY_CARE_PROVIDER_SITE_OTHER): Payer: Medicaid Other | Admitting: Obstetrics & Gynecology

## 2013-12-17 ENCOUNTER — Encounter: Payer: Self-pay | Admitting: Obstetrics & Gynecology

## 2013-12-17 VITALS — BP 140/80 | Wt 200.0 lb

## 2013-12-17 DIAGNOSIS — N840 Polyp of corpus uteri: Secondary | ICD-10-CM

## 2013-12-17 NOTE — Progress Notes (Signed)
Patient ID: Karen Cox, female   DOB: Feb 27, 1961, 53 y.o.   MRN: [id number] Pt with endometrial polyps on biopsy benign  Needs hysteroscopy uterine curettage endometrial ablation on 01/06/2014  Past Medical History  Diagnosis Date  . Allergy   . Depression   . GERD (gastroesophageal reflux disease)   . Anxiety   . Hyperlipidemia   . Vaginal itching 08/05/2013  . Yeast infection 08/05/2013  . Peri-menopause 08/05/2013  . Hot flashes 09/01/2013  . PMB (postmenopausal bleeding) 11/16/2013    Past Surgical History  Procedure Laterality Date  . No past surgeries    . Colonoscopy  09/19/2012    Procedure: COLONOSCOPY;  Surgeon: Danie Binder, MD;  Location: [facility];  Service: Endoscopy;  Laterality: N/A;  8:30 AM    OB History   Grav Para Term Preterm Abortions TAB SAB Ect Mult Living   1 1 1       1       Allergies  Allergen Reactions  . Benadryl [Diphenhydramine Hcl] Other (See Comments)    Makes congestion worst   . Phenol-Glycerin Other (See Comments)    Makes congestion worst.   . Aspirin Rash    rash  . Latex Rash    History   Social History  . Marital Status: Single    Spouse Name: N/A    Number of Children: N/A  . Years of Education: N/A   Social History Main Topics  . Smoking status: Former Smoker -- 0.25 packs/day for 3 years    Types: Cigarettes    Quit date: 09/03/1981  . Smokeless tobacco: Never Used  . Alcohol Use: No  . Drug Use: No  . Sexual Activity: No   Other Topics Concern  . None   Social History Narrative  . None    Family History  Problem Relation Age of Onset  . Diabetes Father   . Heart disease Father   . Hypertension Father   . Colon cancer Neg Hx   . Cancer Neg Hx   . Diabetes Maternal Uncle   . Anemia Maternal Uncle   . Arthritis Maternal Uncle

## 2013-12-24 ENCOUNTER — Encounter (HOSPITAL_COMMUNITY): Payer: Self-pay

## 2013-12-25 ENCOUNTER — Telehealth: Payer: Self-pay

## 2013-12-25 NOTE — Telephone Encounter (Signed)
Pt daughter requesting to speak with Dr. Elonda Husky in regards to pt surgery for 01/06/2014 and medication concerns. Informed Dr. Elonda Husky out of office until Monday.

## 2013-12-28 ENCOUNTER — Ambulatory Visit (INDEPENDENT_AMBULATORY_CARE_PROVIDER_SITE_OTHER): Payer: Medicaid Other | Admitting: Family Medicine

## 2013-12-28 ENCOUNTER — Encounter: Payer: Self-pay | Admitting: Family Medicine

## 2013-12-28 VITALS — BP 130/82 | HR 78 | Resp 16 | Ht 65.0 in | Wt 198.4 lb

## 2013-12-28 DIAGNOSIS — J302 Other seasonal allergic rhinitis: Secondary | ICD-10-CM

## 2013-12-28 DIAGNOSIS — J309 Allergic rhinitis, unspecified: Secondary | ICD-10-CM

## 2013-12-28 DIAGNOSIS — E669 Obesity, unspecified: Secondary | ICD-10-CM

## 2013-12-28 DIAGNOSIS — E785 Hyperlipidemia, unspecified: Secondary | ICD-10-CM

## 2013-12-28 MED ORDER — TRIAMCINOLONE ACETONIDE 0.1 % EX CREA: 1.0000 "application " | TOPICAL_CREAM | Freq: Two times a day (BID) | CUTANEOUS | Status: DC | PRN

## 2013-12-28 NOTE — Progress Notes (Signed)
   Subjective:    Patient ID: Karen Cox, female    DOB: 17-Jul-1961, 53 y.o.   MRN: [id number]  HPI The PT is here for follow up and re-evaluation of chronic medical conditions, medication management and review of any available recent lab and radiology data.  Preventive health is updated, specifically  Cancer screening and Immunization.   Continues to follow gyne for peri/post menopausal symptoms The PT denies any adverse reactions to current medications since the last visit.   Consistently working at healthy lifestyle changes involving regular physical activity and dietary change with excellent results     Review of Systems See HPI Denies recent fever or chills. Denies sinus pressure, nasal congestion, ear pain or sore throat. Denies chest congestion, productive cough or wheezing. Denies chest pains, palpitations and leg swelling Denies abdominal pain, nausea, vomiting,diarrhea or constipation.   Denies dysuria, frequency, hesitancy or incontinence. Denies joint pain, swelling and limitation in mobility. Denies headaches, seizures, numbness, or tingling. Denies depression, anxiety or insomnia. Denies skin break down or rash.        Objective:   Physical Exam BP 130/82  Pulse 78  Resp 16  Ht 5\' 5"  (1.651 m)  Wt 198 lb 6.4 oz (89.994 kg)  BMI 33.02 kg/m2  SpO2 100%  LMP 06/27/2013 Patient alert and oriented and in no cardiopulmonary distress.  HEENT: No facial asymmetry, EOMI,   oropharynx pink and moist.  Neck supple no JVD, no mass.  Chest: Clear to auscultation bilaterally.  CVS: S1, S2 no murmurs, no S3.  ABD: Soft non tender.   Ext: No edema  MS: Adequate ROM spine, shoulders, hips and knees.  Skin: Intact, no ulcerations or rash noted.  Psych: Good eye contact, normal affect. Memory intact not anxious or depressed appearing.  CNS: CN 2-12 intact, power,  normal throughout.no focal deficits noted.        Assessment & Plan:   OBESITY Improved. Pt applauded on succesful weight loss through lifestyle change, and encouraged to continue same. Weight loss goal set for the next several months.   HYPERLIPIDEMIA Hyperlipidemia:Low fat diet discussed and encouraged.  Dietary management only, review labs after 12 month  Seasonal allergies Controlled, no change in medication

## 2013-12-28 NOTE — Patient Instructions (Signed)
F/u in 4 month  Congrats on weight loss, please keep it up!   No changes in medication.

## 2013-12-31 ENCOUNTER — Encounter (HOSPITAL_COMMUNITY)
Admission: RE | Admit: 2013-12-31 | Discharge: 2013-12-31 | Disposition: A | Discharge status: Home / Self Care | Payer: Medicaid Other | Source: Ambulatory Visit | Attending: Obstetrics & Gynecology | Admitting: Obstetrics & Gynecology

## 2013-12-31 ENCOUNTER — Encounter (HOSPITAL_COMMUNITY): Payer: Self-pay

## 2013-12-31 DIAGNOSIS — Z01812 Encounter for preprocedural laboratory examination: Secondary | ICD-10-CM | POA: Insufficient documentation

## 2013-12-31 HISTORY — DX: Mild intellectual disabilities: F70

## 2013-12-31 LAB — URINALYSIS, ROUTINE W REFLEX MICROSCOPIC
Bilirubin Urine: NEGATIVE
Glucose, UA: NEGATIVE mg/dL
HGB URINE DIPSTICK: NEGATIVE
KETONES UR: NEGATIVE mg/dL
Leukocytes, UA: NEGATIVE
Nitrite: NEGATIVE
PROTEIN: NEGATIVE mg/dL
Specific Gravity, Urine: <1.005 — ABNORMAL LOW (ref 1.005–1.030)
Urobilinogen, UA: 0.2 mg/dL (ref 0.0–1.0)
pH: 6 (ref 5.0–8.0)

## 2013-12-31 LAB — CBC
HCT: 36.4 % (ref 36.0–46.0)
HEMOGLOBIN: 12.1 g/dL (ref 12.0–15.0)
MCH: 27.5 pg (ref 26.0–34.0)
MCHC: 33.2 g/dL (ref 30.0–36.0)
MCV: 82.7 fL (ref 78.0–100.0)
Platelets: 254 10*3/uL (ref 150–400)
RBC: 4.4 MIL/uL (ref 3.87–5.11)
RDW: 12.6 % (ref 11.5–15.5)
WBC: 4.8 10*3/uL (ref 4.0–10.5)

## 2013-12-31 LAB — COMPREHENSIVE METABOLIC PANEL
ALT: 10 U/L (ref 0–35)
AST: 15 U/L (ref 0–37)
Albumin: 3.7 g/dL (ref 3.5–5.2)
Alkaline Phosphatase: 84 U/L (ref 39–117)
BILIRUBIN TOTAL: 0.7 mg/dL (ref 0.3–1.2)
BUN: 8 mg/dL (ref 6–23)
CHLORIDE: 105 meq/L (ref 96–112)
CO2: 21 meq/L (ref 19–32)
Calcium: 9.9 mg/dL (ref 8.4–10.5)
Creatinine, Ser: 0.75 mg/dL (ref 0.50–1.10)
GFR calc Af Amer: >90 mL/min (ref >90)
Glucose, Bld: 101 mg/dL — ABNORMAL HIGH (ref 70–99)
Potassium: 4.4 mEq/L (ref 3.7–5.3)
Sodium: 139 mEq/L (ref 137–147)
Total Protein: 7.5 g/dL (ref 6.0–8.3)

## 2013-12-31 LAB — HCG, QUANTITATIVE, PREGNANCY

## 2013-12-31 NOTE — Patient Instructions (Signed)
Your procedure is scheduled on:  01/06/14  Report to [facility] at 06:15 AM  Call this number if you have problems the morning of surgery: [phone]   Remember:   Do not eat food or drink liquids after midnight.   Take these medicines the morning of surgery with A SIP OF WATER: Hydroxzine.   Do not wear jewelry, make-up or nail polish.  Do not wear lotions, powders, or perfumes.   Do not shave 48 hours prior to surgery. Men may shave face and neck.  Do not bring valuables to the hospital.  [facility] is not responsible for any belongings or valuables.               Contacts, dentures or bridgework may not be worn into surgery.  Leave suitcase in the car. After surgery it may be brought to your room.  For patients admitted to the hospital, discharge time is determined by your treatment team.               Patients discharged the day of surgery will not be allowed to drive home.    Special Instructions: Shower using CHG 1 night before surgery and the morning before surgery.  Use special wash - you have one bottle of CHG for both showers.  You should use approximately 1/2 of the bottle for each shower.   Please read over the following fact sheets that you were given: Anesthesia Post-op Instructions and Care and Recovery After Surgery     Dilation and Curettage or Vacuum Curettage Dilation and curettage (D&C) and vacuum curettage are minor procedures. A D&C involves stretching (dilation) the cervix and scraping (curettage) the inside lining of the womb (uterus). During a D&C, tissue is gently scraped from the inside lining of the uterus. During a vacuum curettage, the lining and tissue in the uterus are removed with the use of gentle suction.  Curettage may be performed to either diagnose or treat a problem. As a diagnostic procedure, curettage is performed to examine tissues from the uterus. A diagnostic curettage may be performed for the following symptoms:   Irregular bleeding in the  uterus.   Bleeding with the development of clots.   Spotting between menstrual periods.   Prolonged menstrual periods.   Bleeding after menopause.   No menstrual period (amenorrhea).   A change in size and shape of the uterus.  As a treatment procedure, curettage may be performed for the following reasons:   Removal of an IUD (intrauterine device).   Removal of retained placenta after giving birth. Retained placenta can cause an infection or bleeding severe enough to require transfusions.   Abortion.   Miscarriage.   Removal of polyps inside the uterus.   Removal of uncommon types of noncancerous lumps (fibroids).  LET [facility] CARE PROVIDER KNOW ABOUT:   Any allergies you have.   All medicines you are taking, including vitamins, herbs, eye drops, creams, and over-the-counter medicines.   Previous problems you or members of your family have had with the use of anesthetics.   Any blood disorders you have.   Previous surgeries you have had.   Medical conditions you have. RISKS AND COMPLICATIONS  Generally, this is a safe procedure. However, as with any procedure, complications can occur. Possible complications include:  Excessive bleeding.   Infection of the uterus.   Damage to the cervix.   Development of scar tissue (adhesions) inside the uterus, later causing abnormal amounts of menstrual bleeding.   Complications  from the general anesthetic, if a general anesthetic is used.   Putting a hole (perforation) in the uterus. This is rare.  BEFORE THE PROCEDURE   Eat and drink before the procedure only as directed by your health care provider.   Arrange for someone to take you home.  PROCEDURE  This procedure usually takes about 15 30 minutes.  You will be given one of the following:  A medicine that numbs the area in and around the cervix (local anesthetic).   A medicine to make you sleep through the procedure (general  anesthetic).  You will lie on your back with your legs in stirrups.   A warm metal or plastic instrument (speculum) will be placed in your vagina to keep it open and to allow the health care provider to see the cervix.  There are two ways in which your cervix can be softened and dilated. These include:   Taking a medicine.   Having thin rods (laminaria) inserted into your cervix.   A curved tool (curette) will be used to scrape cells from the inside lining of the uterus. In some cases, gentle suction is applied with the curette. The curette will then be removed.  AFTER THE PROCEDURE   You will rest in the recovery area until you are stable and are ready to go home.   You may feel sick to your stomach (nauseous) or throw up (vomit) if you were given a general anesthetic.   You may have a sore throat if a tube was placed in your throat during general anesthesia.   You may have light cramping and bleeding. This may last for 2 days to 2 weeks after the procedure.   Your uterus needs to make a new lining after the procedure. This may make your next period late. Document Released: 09/24/2005 Document Revised: 05/27/2013 Document Reviewed: 04/23/2013 [facility] Patient Information 2014 [place], [place].    Hysteroscopy Hysteroscopy is a procedure used for looking inside the womb (uterus). It may be done for various reasons, including:  To evaluate abnormal bleeding, fibroid (benign, noncancerous) tumors, polyps, scar tissue (adhesions), and possibly cancer of the uterus.  To look for lumps (tumors) and other uterine growths.  To look for causes of why a woman cannot get pregnant (infertility), causes of recurrent loss of pregnancy (miscarriages), or a lost intrauterine device (IUD).  To perform a sterilization by blocking the fallopian tubes from inside the uterus. In this procedure, a thin, flexible tube with a tiny light and camera on the end of it (hysteroscope) is used to  look inside the uterus. A hysteroscopy should be done right after a menstrual period to be sure you are not pregnant. LET [facility] CARE PROVIDER KNOW ABOUT:   Any allergies you have.  All medicines you are taking, including vitamins, herbs, eye drops, creams, and over-the-counter medicines.  Previous problems you or members of your family have had with the use of anesthetics.  Any blood disorders you have.  Previous surgeries you have had.  Medical conditions you have. RISKS AND COMPLICATIONS  Generally, this is a safe procedure. However, as with any procedure, complications can occur. Possible complications include:  Putting a hole in the uterus.  Excessive bleeding.  Infection.  Damage to the cervix.  Injury to other organs.  Allergic reaction to medicines.  Too much fluid used in the uterus for the procedure. BEFORE THE PROCEDURE   Ask your health care provider about changing or stopping any regular medicines.  Do not take aspirin or blood thinners for 1 week before the procedure, or as directed by your health care provider. These can cause bleeding.  If you smoke, do not smoke for 2 weeks before the procedure.  In some cases, a medicine is placed in the cervix the day before the procedure. This medicine makes the cervix have a larger opening (dilate). This makes it easier for the instrument to be inserted into the uterus during the procedure.  Do not eat or drink anything for at least 8 hours before the surgery.  Arrange for someone to take you home after the procedure. PROCEDURE   You may be given a medicine to relax you (sedative). You may also be given one of the following:  A medicine that numbs the area around the cervix (local anesthetic).  A medicine that makes you sleep through the procedure (general anesthetic).  The hysteroscope is inserted through the vagina into the uterus. The camera on the hysteroscope sends a picture to a TV screen. This gives  the surgeon a good view inside the uterus.  During the procedure, air or a liquid is put into the uterus, which allows the surgeon to see better.  Sometimes, tissue is gently scraped from inside the uterus. These tissue samples are sent to a lab for testing. AFTER THE PROCEDURE   If you had a general anesthetic, you may be groggy for a couple hours after the procedure.  If you had a local anesthetic, you will be able to go home as soon as you are stable and feel ready.  You may have some cramping. This normally lasts for a couple days.  You may have bleeding, which varies from light spotting for a few days to menstrual-like bleeding for 3 7 days. This is normal.  If your test results are not back during the visit, make an appointment with your health care provider to find out the results. Document Released: 12/31/2000 Document Revised: 07/15/2013 Document Reviewed: 04/23/2013 [facility] Patient Information 2014 [place], [place].    Endometrial Ablation Endometrial ablation removes the lining of the uterus (endometrium). It is usually a same-day, outpatient treatment. Ablation helps avoid major surgery, such as surgery to remove the cervix and uterus (hysterectomy). After endometrial ablation, you will have little or no menstrual bleeding and may not be able to have children. However, if you are premenopausal, you will need to use a reliable method of birth control following the procedure because of the small chance that pregnancy can occur. There are different reasons to have this procedure, which include:  Heavy periods.  Bleeding that is causing anemia.  Irregular bleeding.  Bleeding fibroids on the lining inside the uterus if they are smaller than 3 centimeters. This procedure should not be done if:  You want children in the future.  You have severe cramps with your menstrual period.  You have precancerous or cancerous cells in your uterus.  You were recently pregnant.  You  have gone through menopause.  You have had major surgery on the uterus, such as a cesarean delivery. LET [facility] CARE PROVIDER KNOW ABOUT:  Any allergies you have.  All medicines you are taking, including vitamins, herbs, eye drops, creams, and over-the-counter medicines.  Previous problems you or members of your family have had with the use of anesthetics.  Any blood disorders you have.  Previous surgeries you have had.  Medical conditions you have. RISKS AND COMPLICATIONS  Generally, this is a safe procedure. However, as with  any procedure, complications can occur. Possible complications include:  Perforation of the uterus.  Bleeding.  Infection of the uterus, bladder, or vagina.  Injury to surrounding organs.  An air bubble to the lung (air embolus).  Pregnancy following the procedure.  Failure of the procedure to help the problem, requiring hysterectomy.  Decreased ability to diagnose cancer in the lining of the uterus. BEFORE THE PROCEDURE  The lining of the uterus must be tested to make sure there is no pre-cancerous or cancer cells present.  An ultrasound may be performed to look at the size of the uterus and to check for abnormalities.  Medicines may be given to thin the lining of the uterus. PROCEDURE  During the procedure, your health care provider will use a tool called a resectoscope to help see inside your uterus. There are different ways to remove the lining of your uterus.   Radiofrequency  This method uses a radiofrequency-alternating electric current to remove the lining of the uterus.  Cryotherapy This method uses extreme cold to freeze the lining of the uterus.  Heated-Free Liquid  This method uses heated salt (saline) solution to remove the lining of the uterus.  Microwave This method uses high-energy microwaves to heat up the lining of the uterus to remove it.  Thermal balloon  This method involves inserting a catheter with a balloon tip  into the uterus. The balloon tip is filled with heated fluid to remove the lining of the uterus. AFTER THE PROCEDURE  After your procedure, do not have sexual intercourse or insert anything into your vagina until permitted by your health care provider. After the procedure, you may experience:  Cramps.  Vaginal discharge.  Frequent urination. Document Released: 08/03/2004 Document Revised: 05/27/2013 Document Reviewed: 02/25/2013 [facility] Patient Information 2014 [place]    PATIENT INSTRUCTIONS POST-ANESTHESIA  IMMEDIATELY FOLLOWING SURGERY:  Do not drive or operate machinery for the first twenty four hours after surgery.  Do not make any important decisions for twenty four hours after surgery or while taking narcotic pain medications or sedatives.  If you develop intractable nausea and vomiting or a severe headache please notify your doctor immediately.  FOLLOW-UP:  Please make an appointment with your surgeon as instructed. You do not need to follow up with anesthesia unless specifically instructed to do so.  WOUND CARE INSTRUCTIONS (if applicable):  Keep a dry clean dressing on the anesthesia/puncture wound site if there is drainage.  Once the wound has quit draining you may leave it open to air.  Generally you should leave the bandage intact for twenty four hours unless there is drainage.  If the epidural site drains for more than 36-48 hours please call the anesthesia department.  QUESTIONS?:  Please feel free to call your physician or the hospital operator if you have any questions, and they will be happy to assist you.

## 2014-01-04 ENCOUNTER — Telehealth: Payer: Self-pay

## 2014-01-04 DIAGNOSIS — L259 Unspecified contact dermatitis, unspecified cause: Secondary | ICD-10-CM

## 2014-01-04 DIAGNOSIS — L509 Urticaria, unspecified: Secondary | ICD-10-CM

## 2014-01-04 NOTE — Addendum Note (Signed)
Addended by: Eual Fines on: 01/04/2014 03:21 PM   Modules accepted: Orders

## 2014-01-04 NOTE — Telephone Encounter (Signed)
Referral entered  

## 2014-01-04 NOTE — Telephone Encounter (Signed)
ok to refer to requested MD, based on pt history, pls do I will sign

## 2014-01-04 NOTE — Telephone Encounter (Signed)
Please advise? Is this ok? 

## 2014-01-06 ENCOUNTER — Ambulatory Visit (HOSPITAL_COMMUNITY): Payer: Medicaid Other | Admitting: Anesthesiology

## 2014-01-06 ENCOUNTER — Ambulatory Visit (HOSPITAL_COMMUNITY)
Admission: RE | Admit: 2014-01-06 | Discharge: 2014-01-06 | Disposition: A | Discharge status: Home / Self Care | Payer: Medicaid Other | Source: Ambulatory Visit | Attending: Obstetrics & Gynecology | Admitting: Obstetrics & Gynecology

## 2014-01-06 ENCOUNTER — Encounter (HOSPITAL_COMMUNITY): Payer: Medicaid Other | Admitting: Anesthesiology

## 2014-01-06 ENCOUNTER — Encounter (HOSPITAL_COMMUNITY): Payer: Self-pay | Admitting: *Deleted

## 2014-01-06 ENCOUNTER — Encounter (HOSPITAL_COMMUNITY)
Admission: RE | Disposition: A | Discharge status: Home / Self Care | Payer: Self-pay | Source: Ambulatory Visit | Attending: Obstetrics & Gynecology

## 2014-01-06 DIAGNOSIS — N950 Postmenopausal bleeding: Secondary | ICD-10-CM | POA: Insufficient documentation

## 2014-01-06 DIAGNOSIS — N840 Polyp of corpus uteri: Secondary | ICD-10-CM

## 2014-01-06 DIAGNOSIS — Z87891 Personal history of nicotine dependence: Secondary | ICD-10-CM | POA: Insufficient documentation

## 2014-01-06 DIAGNOSIS — F411 Generalized anxiety disorder: Secondary | ICD-10-CM | POA: Insufficient documentation

## 2014-01-06 DIAGNOSIS — E785 Hyperlipidemia, unspecified: Secondary | ICD-10-CM | POA: Insufficient documentation

## 2014-01-06 DIAGNOSIS — Z79899 Other long term (current) drug therapy: Secondary | ICD-10-CM | POA: Insufficient documentation

## 2014-01-06 HISTORY — PX: CERVICAL POLYPECTOMY: SHX88

## 2014-01-06 HISTORY — PX: DILITATION & CURRETTAGE/HYSTROSCOPY WITH THERMACHOICE ABLATION: SHX5569

## 2014-01-06 SURGERY — DILATATION & CURETTAGE/HYSTEROSCOPY WITH THERMACHOICE ABLATION
Anesthesia: General | Site: Uterus

## 2014-01-06 MED ORDER — LIDOCAINE HCL 1 % IJ SOLN
INTRAMUSCULAR | Status: DC | PRN
  Administered 2014-01-06: 50 mg via INTRADERMAL

## 2014-01-06 MED ORDER — 0.9 % SODIUM CHLORIDE (POUR BTL) OPTIME
TOPICAL | Status: DC | PRN
  Administered 2014-01-06: 1000 mL

## 2014-01-06 MED ORDER — GLYCOPYRROLATE 0.2 MG/ML IJ SOLN
INTRAMUSCULAR | Status: AC
  Filled 2014-01-06: qty 1

## 2014-01-06 MED ORDER — FENTANYL CITRATE 0.05 MG/ML IJ SOLN
25.0000 ug | INTRAMUSCULAR | Status: DC | PRN
  Administered 2014-01-06: 50 ug via INTRAVENOUS
  Filled 2014-01-06: qty 2

## 2014-01-06 MED ORDER — FENTANYL CITRATE 0.05 MG/ML IJ SOLN
INTRAMUSCULAR | Status: DC | PRN
  Administered 2014-01-06: 50 ug via INTRAVENOUS
  Administered 2014-01-06 (×4): 25 ug via INTRAVENOUS

## 2014-01-06 MED ORDER — CEFAZOLIN SODIUM-DEXTROSE 2-3 GM-% IV SOLR
2.0000 g | INTRAVENOUS | Status: AC
Start: 2014-01-06 — End: 2014-01-06
  Administered 2014-01-06: 2 g via INTRAVENOUS
  Filled 2014-01-06: qty 50

## 2014-01-06 MED ORDER — LACTATED RINGERS IV SOLN
INTRAVENOUS | Status: DC
  Administered 2014-01-06: 1000 mL via INTRAVENOUS

## 2014-01-06 MED ORDER — MIDAZOLAM HCL 2 MG/2ML IJ SOLN
INTRAMUSCULAR | Status: AC
  Filled 2014-01-06: qty 2

## 2014-01-06 MED ORDER — DEXTROSE 5 % IV SOLN
INTRAVENOUS | Status: DC | PRN
  Administered 2014-01-06: 500 mL

## 2014-01-06 MED ORDER — MIDAZOLAM HCL 5 MG/5ML IJ SOLN
INTRAMUSCULAR | Status: DC | PRN
  Administered 2014-01-06: 2 mg via INTRAVENOUS

## 2014-01-06 MED ORDER — PROPOFOL 10 MG/ML IV BOLUS
INTRAVENOUS | Status: DC | PRN
  Administered 2014-01-06: 150 mg via INTRAVENOUS

## 2014-01-06 MED ORDER — FENTANYL CITRATE 0.05 MG/ML IJ SOLN
INTRAMUSCULAR | Status: AC
  Filled 2014-01-06: qty 2

## 2014-01-06 MED ORDER — LIDOCAINE HCL (PF) 1 % IJ SOLN
INTRAMUSCULAR | Status: AC
  Filled 2014-01-06: qty 5

## 2014-01-06 MED ORDER — ONDANSETRON HCL 8 MG PO TABS: 8.0000 mg | ORAL_TABLET | Freq: Three times a day (TID) | ORAL | Status: DC | PRN

## 2014-01-06 MED ORDER — ONDANSETRON HCL 4 MG/2ML IJ SOLN
INTRAMUSCULAR | Status: AC
  Filled 2014-01-06: qty 2

## 2014-01-06 MED ORDER — MIDAZOLAM HCL 2 MG/2ML IJ SOLN
1.0000 mg | INTRAMUSCULAR | Status: DC | PRN
  Administered 2014-01-06: 2 mg via INTRAVENOUS

## 2014-01-06 MED ORDER — ONDANSETRON HCL 4 MG/2ML IJ SOLN: 4.0000 mg | Freq: Once | INTRAMUSCULAR | Status: DC | PRN

## 2014-01-06 MED ORDER — SODIUM CHLORIDE 0.9 % IR SOLN
Status: DC | PRN
Start: 2014-01-06 — End: 2014-01-06
  Administered 2014-01-06: 1000 mL

## 2014-01-06 MED ORDER — GLYCOPYRROLATE 0.2 MG/ML IJ SOLN
0.2000 mg | Freq: Once | INTRAMUSCULAR | Status: AC
  Administered 2014-01-06: 0.2 mg via INTRAVENOUS

## 2014-01-06 MED ORDER — ONDANSETRON HCL 4 MG/2ML IJ SOLN
4.0000 mg | Freq: Once | INTRAMUSCULAR | Status: AC
Start: 2014-01-06 — End: 2014-01-06
  Administered 2014-01-06: 4 mg via INTRAVENOUS

## 2014-01-06 MED ORDER — PROPOFOL 10 MG/ML IV BOLUS
INTRAVENOUS | Status: AC
  Filled 2014-01-06: qty 20

## 2014-01-06 MED ORDER — FENTANYL CITRATE 0.05 MG/ML IJ SOLN
25.0000 ug | INTRAMUSCULAR | Status: AC
  Administered 2014-01-06 (×2): 25 ug via INTRAVENOUS

## 2014-01-06 MED ORDER — HYDROCODONE-ACETAMINOPHEN 5-325 MG PO TABS: 1.0000 | ORAL_TABLET | Freq: Four times a day (QID) | ORAL | Status: DC | PRN

## 2014-01-06 SURGICAL SUPPLY — 35 items
BAG DECANTER FOR FLEXI CONT (MISCELLANEOUS) ×3 IMPLANT
BAG HAMPER (MISCELLANEOUS) ×3 IMPLANT
CATH THERMACHOICE III (CATHETERS) ×3 IMPLANT
CLOTH BEACON ORANGE TIMEOUT ST (SAFETY) ×3 IMPLANT
COVER LIGHT HANDLE STERIS (MISCELLANEOUS) ×6 IMPLANT
COVER MAYO STAND XLG (DRAPE) ×3 IMPLANT
FORMALIN 10 PREFIL 120ML (MISCELLANEOUS) ×3 IMPLANT
GAUZE SPONGE 4X4 16PLY XRAY LF (GAUZE/BANDAGES/DRESSINGS) ×3 IMPLANT
GLOVE BIOGEL PI IND STRL 7.0 (GLOVE) ×2 IMPLANT
GLOVE BIOGEL PI IND STRL 8 (GLOVE) ×1 IMPLANT
GLOVE BIOGEL PI INDICATOR 7.0 (GLOVE) ×4
GLOVE BIOGEL PI INDICATOR 8 (GLOVE) ×2
GLOVE ECLIPSE 8.0 STRL XLNG CF (GLOVE) IMPLANT
GLOVE SKINSENSE NS SZ6.5 (GLOVE) ×2
GLOVE SKINSENSE NS SZ8.0 LF (GLOVE) ×2
GLOVE SKINSENSE STRL SZ6.5 (GLOVE) ×1 IMPLANT
GLOVE SKINSENSE STRL SZ8.0 LF (GLOVE) ×1 IMPLANT
GOWN STRL REUS W/TWL LRG LVL3 (GOWN DISPOSABLE) ×6 IMPLANT
GOWN STRL REUS W/TWL XL LVL3 (GOWN DISPOSABLE) ×3 IMPLANT
INST SET HYSTEROSCOPY (KITS) ×3 IMPLANT
IV D5W 500ML (IV SOLUTION) ×3 IMPLANT
IV NS 1000ML (IV SOLUTION) ×3
IV NS 1000ML BAXH (IV SOLUTION) ×1 IMPLANT
KIT ROOM TURNOVER AP CYSTO (KITS) ×3 IMPLANT
MANIFOLD NEPTUNE II (INSTRUMENTS) ×3 IMPLANT
MARKER SKIN DUAL TIP RULER LAB (MISCELLANEOUS) ×3 IMPLANT
NS IRRIG 1000ML POUR BTL (IV SOLUTION) ×3 IMPLANT
PACK BASIC III (CUSTOM PROCEDURE TRAY) ×3
PACK SRG BSC III STRL LF ECLPS (CUSTOM PROCEDURE TRAY) ×1 IMPLANT
PAD ARMBOARD 7.5X6 YLW CONV (MISCELLANEOUS) ×3 IMPLANT
PAD TELFA 3X4 1S STER (GAUZE/BANDAGES/DRESSINGS) ×3 IMPLANT
SET BASIN LINEN APH (SET/KITS/TRAYS/PACK) ×3 IMPLANT
SET IRRIG Y TYPE TUR BLADDER L (SET/KITS/TRAYS/PACK) ×3 IMPLANT
SHEET LAVH (DRAPES) ×3 IMPLANT
YANKAUER SUCT BULB TIP 10FT TU (MISCELLANEOUS) ×3 IMPLANT

## 2014-01-06 NOTE — Transfer of Care (Signed)
Immediate Anesthesia Transfer of Care Note  Patient: Karen Cox  Procedure(s) Performed: Procedure(s) with comments: DILATATION & CURETTAGE/HYSTEROSCOPY WITH THERMACHOICE ABLATION (N/A) - total therapy time:  9 minutes 12 seconds ,     temperature:87 degrees CERVICAL POLYPECTOMY (N/A)  Patient Location: PACU  Anesthesia Type:General  Level of Consciousness: awake and patient cooperative  Airway & Oxygen Therapy: Patient Spontanous Breathing and Patient connected to face mask oxygen  Post-op Assessment: Report given to PACU RN, Post -op Vital signs reviewed and stable and Patient moving all extremities  Post vital signs: Reviewed and stable  Complications: No apparent anesthesia complications

## 2014-01-06 NOTE — Anesthesia Postprocedure Evaluation (Signed)
  Anesthesia Post-op Note  Patient: Karen Cox  Procedure(s) Performed: Procedure(s) with comments: DILATATION & CURETTAGE/HYSTEROSCOPY WITH THERMACHOICE ABLATION (N/A) - total therapy time:  9 minutes 12 seconds ,     temperature:87 degrees CERVICAL POLYPECTOMY (N/A)  Patient Location: PACU  Anesthesia Type:General  Level of Consciousness: awake, alert , oriented and patient cooperative  Airway and Oxygen Therapy: Patient Spontanous Breathing  Post-op Pain: 2 /10, mild  Post-op Assessment: Post-op Vital signs reviewed, Patient's Cardiovascular Status Stable, Respiratory Function Stable, Patent Airway, No signs of Nausea or vomiting and Pain level controlled  Post-op Vital Signs: Reviewed and stable  Complications: No apparent anesthesia complications

## 2014-01-06 NOTE — Anesthesia Preprocedure Evaluation (Signed)
Anesthesia Evaluation  Patient identified by MRN, date of birth, ID band Patient awake    Reviewed: Allergy & Precautions, H&P , NPO status , Patient's Chart, lab work & pertinent test results  Airway Mallampati: II TM Distance: >3 FB Neck ROM: Full    Dental  (+) Teeth Intact   Pulmonary neg pulmonary ROS, former smoker,  breath sounds clear to auscultation        Cardiovascular negative cardio ROS  Rhythm:Regular Rate:Normal     Neuro/Psych PSYCHIATRIC DISORDERS Anxiety Depression    GI/Hepatic   Endo/Other    Renal/GU      Musculoskeletal   Abdominal   Peds  Hematology   Anesthesia Other Findings   Reproductive/Obstetrics                           Anesthesia Physical Anesthesia Plan  ASA: II  Anesthesia Plan: General   Post-op Pain Management:    Induction: Intravenous  Airway Management Planned: LMA  Additional Equipment:   Intra-op Plan:   Post-operative Plan: Extubation in OR  Informed Consent: I have reviewed the patients History and Physical, chart, labs and discussed the procedure including the risks, benefits and alternatives for the proposed anesthesia with the patient or authorized representative who has indicated his/her understanding and acceptance.     Plan Discussed with:   Anesthesia Plan Comments:         Anesthesia Quick Evaluation

## 2014-01-06 NOTE — Op Note (Signed)
Preoperative diagnosis:  Post menopausal bleeding                                         Endometrial polyps  Postoperative diagnoses: Same as above   Procedure: Hysteroscopy, uterine curettage, endometrial ablation, removal of endometrial polyps  Surgeon: Elonda Husky MD  Anesthesia: Laryngeal mask airway  Findings: The endometrium was smooth except for 3 relatively small endometrial polyps.  Pre operative endometrial biopsy revealed them to be benign  Description of operation: The patient was taken to the operating room and placed in the supine position. She underwent general anesthesia using the laryngeal mask airway. She was placed in the dorsal lithotomy position and prepped and draped in the usual sterile fashion. A Graves speculum was placed and the anterior cervical lip was grasped with a single-tooth tenaculum. The cervix was dilated serially to allow passage of the hysteroscope. Diagnostic hysteroscopy was performed and was found to be normal. A vigorous uterine curettage was then performed and all tissue sent to pathology for evaluation. The ThermaChoice 3 endometrial ablation balloon was then used were 16 cc of D5W was required to maintain a pressure of 190-200 mm of mercury throughout the procedure. Toatl therapy time was 9:14.  All of the equipment worked well throughout the procedure. All of the fluid was returned at the end of the procedure. The patient was awakened from anesthesia and taken to the recovery room in good stable condition all counts were correct. She received 2 g of Ancef preoperatively. She will be discharged from the recovery room and followed up in the office in 1- 2 weeks.  EURE,LUTHER H 01/06/2014 8:28 AM  01/06/2014 8:26 AM

## 2014-01-06 NOTE — H&P (Signed)
Karen Cox is a 53 year old black female in for [place] for PMB on HRT  On prempro had some PMB x 2 months  ES 9.8 homogenous  Pt with endometrial polyps on biopsy benign  Needs hysteroscopy uterine curettage endometrial ablation on 01/06/2014  Preoperative History and Physical  Karen Cox is a 53 y.o. G1P1001 with Patient's last menstrual period was 06/27/2013. admitted for a hysteroscopy uterine curettage endometrial ablation.    PMH:    Past Medical History  Diagnosis Date  . Allergy   . Depression   . GERD (gastroesophageal reflux disease)   . Anxiety   . Hyperlipidemia   . Vaginal itching 08/05/2013  . Yeast infection 08/05/2013  . Peri-menopause 08/05/2013  . Hot flashes 09/01/2013  . PMB (postmenopausal bleeding) 11/16/2013  . Mild mental slowing     PSH:     Past Surgical History  Procedure Laterality Date  . No past surgeries    . Colonoscopy  09/19/2012    Procedure: COLONOSCOPY;  Surgeon: Danie Binder, MD;  [facility]: [facility];  Service: Endoscopy;  Laterality: N/A;  8:30 AM    POb/GynH:      OB History   Grav Para Term Preterm Abortions TAB SAB Ect Mult Living   1 1 1       1       SH:   History  Substance Use Topics  . Smoking status: Former Smoker -- 0.25 packs/day for 3 years    Types: Cigarettes    Quit date: 09/03/1981  . Smokeless tobacco: Never Used  . Alcohol Use: No    FH:    Family History  Problem Relation Age of Onset  . Diabetes Father   . Heart disease Father   . Hypertension Father   . Colon cancer Neg Hx   . Cancer Neg Hx   . Diabetes Maternal Uncle   . Anemia Maternal Uncle   . Arthritis Maternal Uncle      Allergies:  Allergies  Allergen Reactions  . Benadryl [Diphenhydramine Hcl] Other (See Comments)    Makes congestion worst   . Phenol-Glycerin Other (See Comments)    Makes congestion worst.   . Aspirin Rash    rash  . Latex Rash    Medications:      Current facility-administered medications:ceFAZolin  (ANCEF) IVPB 2 g/50 mL premix, 2 g, Intravenous, On Call to OR, Florian Buff, MD;  fentaNYL (SUBLIMAZE) injection 25 mcg, 25 mcg, Intravenous, Q10 min, Lerry Liner, MD, 25 mcg at 01/06/14 0715;  glycopyrrolate (ROBINUL) injection 0.2 mg, 0.2 mg, Intravenous, Once, Lerry Liner, MD lactated ringers infusion, , Intravenous, Continuous, Lerry Liner, MD, Last Rate: 75 mL/hr at 01/06/14 0714, 1,000 mL at 01/06/14 0714;  midazolam (VERSED) injection 1-2 mg, 1-2 mg, Intravenous, Q5 Min x 3 PRN, Lerry Liner, MD;  ondansetron [facility]) injection 4 mg, 4 mg, Intravenous, Once, Lerry Liner, MD  Review of Systems:   Review of Systems  Constitutional: Negative for fever, chills, weight loss, malaise/fatigue and diaphoresis.  HENT: Negative for hearing loss, ear pain, nosebleeds, congestion, sore throat, neck pain, tinnitus and ear discharge.   Eyes: Negative for blurred vision, double vision, photophobia, pain, discharge and redness.  Respiratory: Negative for cough, hemoptysis, sputum production, shortness of breath, wheezing and stridor.   Cardiovascular: Negative for chest pain, palpitations, orthopnea, claudication, leg swelling and PND.  Gastrointestinal: Positive for abdominal pain. Negative for heartburn, nausea, vomiting, diarrhea, constipation, blood in stool and melena.  Genitourinary:  Negative for dysuria, urgency, frequency, hematuria and flank pain.  Musculoskeletal: Negative for myalgias, back pain, joint pain and falls.  Skin: Negative for itching and rash.  Neurological: Negative for dizziness, tingling, tremors, sensory change, speech change, focal weakness, seizures, loss of consciousness, weakness and headaches.  Endo/Heme/Allergies: Negative for environmental allergies and polydipsia. Does not bruise/bleed easily.  Psychiatric/Behavioral: Negative for depression, suicidal ideas, hallucinations, memory loss and substance abuse. The patient is not nervous/anxious and does not have  insomnia.      PHYSICAL EXAM:  Blood pressure 136/76, pulse 104, temperature 97.5 F (36.4 C), temperature source Oral, resp. rate 17, last menstrual period 06/27/2013, SpO2 99.00%.    Vitals reviewed. Constitutional: She is oriented to person, place, and time. She appears well-developed and well-nourished.  HENT:  Head: Normocephalic and atraumatic.  Right Ear: External ear normal.  Left Ear: External ear normal.  Nose: Nose normal.  Mouth/Throat: Oropharynx is clear and moist.  Eyes: Conjunctivae and EOM are normal. Pupils are equal, round, and reactive to light. Right eye exhibits no discharge. Left eye exhibits no discharge. No scleral icterus.  Neck: Normal range of motion. Neck supple. No tracheal deviation present. No thyromegaly present.  Cardiovascular: Normal rate, regular rhythm, normal heart sounds and intact distal pulses.  Exam reveals no gallop and no friction rub.   No murmur heard. Respiratory: Effort normal and breath sounds normal. No respiratory distress. She has no wheezes. She has no rales. She exhibits no tenderness.  GI: Soft. Bowel sounds are normal. She exhibits no distension and no mass. There is tenderness. There is no rebound and no guarding.  Genitourinary:       Vulva is normal without lesions Vagina is pink moist without discharge Cervix normal in appearance and pap is normal Uterus is normal sized Adnexa is negative with normal sized ovaries by sonogram  Musculoskeletal: Normal range of motion. She exhibits no edema and no tenderness.  Neurological: She is alert and oriented to person, place, and time. She has normal reflexes. She displays normal reflexes. No cranial nerve deficit. She exhibits normal muscle tone. Coordination normal.  Skin: Skin is warm and dry. No rash noted. No erythema. No pallor.  Psychiatric: She has a normal mood and affect. Her behavior is normal. Judgment and thought content normal.    Labs: Results for orders placed  during the hospital encounter of 12/31/13 (from the past 336 hour(s))  CBC   Collection Time    12/31/13  9:50 AM      Result Value Ref Range   WBC 4.8  4.0 - 10.5 K/uL   RBC 4.40  3.87 - 5.11 MIL/uL   Hemoglobin 12.1  12.0 - 15.0 g/dL   HCT 36.4  36.0 - 46.0 %   MCV 82.7  78.0 - 100.0 fL   MCH 27.5  26.0 - 34.0 pg   MCHC 33.2  30.0 - 36.0 g/dL   RDW 12.6  11.5 - 15.5 %   Platelets 254  150 - 400 K/uL  COMPREHENSIVE METABOLIC PANEL   Collection Time    12/31/13  9:50 AM      Result Value Ref Range   Sodium 139  137 - 147 mEq/L   Potassium 4.4  3.7 - 5.3 mEq/L   Chloride 105  96 - 112 mEq/L   CO2 21  19 - 32 mEq/L   Glucose, Bld 101 (*) 70 - 99 mg/dL   BUN 8  6 - 23 mg/dL   Creatinine, Ser  0.75  0.50 - 1.10 mg/dL   Calcium 9.9  8.4 - 10.5 mg/dL   Total Protein 7.5  6.0 - 8.3 g/dL   Albumin 3.7  3.5 - 5.2 g/dL   AST 15  0 - 37 U/L   ALT 10  0 - 35 U/L   Alkaline Phosphatase 84  39 - 117 U/L   Total Bilirubin 0.7  0.3 - 1.2 mg/dL   GFR calc non Af Amer >90  >90 mL/min   GFR calc Af Amer >90  >90 mL/min  URINALYSIS, ROUTINE W REFLEX MICROSCOPIC   Collection Time    12/31/13  9:50 AM      Result Value Ref Range   Color, Urine YELLOW  YELLOW   APPearance CLEAR  CLEAR   Specific Gravity, Urine <1.005 (*) 1.005 - 1.030   pH 6.0  5.0 - 8.0   Glucose, UA NEGATIVE  NEGATIVE mg/dL   Hgb urine dipstick NEGATIVE  NEGATIVE   Bilirubin Urine NEGATIVE  NEGATIVE   Ketones, ur NEGATIVE  NEGATIVE mg/dL   Protein, ur NEGATIVE  NEGATIVE mg/dL   Urobilinogen, UA 0.2  0.0 - 1.0 mg/dL   Nitrite NEGATIVE  NEGATIVE   Leukocytes, UA NEGATIVE  NEGATIVE  HCG, QUANTITATIVE, PREGNANCY   Collection Time    12/31/13  9:50 AM      Result Value Ref Range   hCG, Beta Chain, Quant, S <1  <5 mIU/mL    EKG: Orders placed during the hospital encounter of 09/04/11  . ED EKG  . ED EKG  . EKG    Imaging Studies: ILAISAANE MARTS is a 53 y.o. G1P1001 for a pelvic sonogram for post-menopausal  bleeding.  Uterus 8.4 x 4.5 x 4.3 cm, anteverted  Endometrium 9.8 mm, No obvious "definite"mass noted although difficult to evaluate  Right ovary 2.5 x 1.7 x 1.6 cm,  Left ovary 2.2 x 1.5 x 1.5 cm,  No free fluid or adnexal masses noted within pelvis  Technician Comments:  Anteverted uterus noted, ENDOM=9.65mm no obvious mass noted within cavity although difficult to evaluate well, bilateral adnexa/ovaries appears wnl, no free fluid or adnexal masses noted, Pt very anxious during ultrasound although no tenderness noted by patient during exam  Lazarus Gowda  11/16/2013  10:07 AM  Clinical Impression and recommendations:  I have reviewed the sonogram results above.  Combined with the patient's current clinical course, below are my impressions and any appropriate recommendations for management based on the sonographic findings:  Pt is on hormone therapy, but thickening of endometrium generous for hormone replacement tx with prempro. Will recommend endometrial biopsy for eval of endometrium. Discussed with J a Griffin CNP,  FERGUSON,JOHN V  Biopsy: BENIGN ENDOMETRIAL POLYPS     Assessment: Post menopausal bleeding from endometrial polyps Patient Active Problem List   Diagnosis Date Noted  . Endometrial polyp 12/17/2013  . Yeast infection 11/16/2013  . PMB (postmenopausal bleeding) 11/16/2013  . Hot flashes 09/01/2013  . Hormone replacement therapy (HRT) 09/01/2013  . Vaginal itching 08/05/2013  . Yeast infection 08/05/2013  . Peri-menopause 08/05/2013  . Gynecomastia 01/05/2013  . Abnormal facial hair 01/05/2013  . Anxiety and depression 12/31/2010  . Seasonal allergies 12/31/2010  . DERMATITIS, ALLERGIC 08/24/2010  . HYPERLIPIDEMIA 04/18/2010  . METABOLIC SYNDROME X 17/61/6073  . FATIGUE 04/18/2010  . OBESITY 02/13/2010    Plan: Hysteroscopy uterine curettage endometrial ablation  EURE,LUTHER H 01/06/2014 7:16 AM

## 2014-01-06 NOTE — Discharge Instructions (Signed)
Endometrial Ablation Endometrial ablation removes the lining of the uterus (endometrium). It is usually a same-day, outpatient treatment. Ablation helps avoid major surgery, such as surgery to remove the cervix and uterus (hysterectomy). After endometrial ablation, you will have little or no menstrual bleeding and may not be able to have children. However, if you are premenopausal, you will need to use a reliable method of birth control following the procedure because of the small chance that pregnancy can occur. There are different reasons to have this procedure, which include:  Heavy periods.  Bleeding that is causing anemia.  Irregular bleeding.  Bleeding fibroids on the lining inside the uterus if they are smaller than 3 centimeters. This procedure should not be done if:  You want children in the future.  You have severe cramps with your menstrual period.  You have precancerous or cancerous cells in your uterus.  You were recently pregnant.  You have gone through menopause.  You have had major surgery on the uterus, such as a cesarean delivery. LET [facility] CARE PROVIDER KNOW ABOUT:  Any allergies you have.  All medicines you are taking, including vitamins, herbs, eye drops, creams, and over-the-counter medicines.  Previous problems you or members of your family have had with the use of anesthetics.  Any blood disorders you have.  Previous surgeries you have had.  Medical conditions you have. RISKS AND COMPLICATIONS  Generally, this is a safe procedure. However, as with any procedure, complications can occur. Possible complications include:  Perforation of the uterus.  Bleeding.  Infection of the uterus, bladder, or vagina.  Injury to surrounding organs.  An air bubble to the lung (air embolus).  Pregnancy following the procedure.  Failure of the procedure to help the problem, requiring hysterectomy.  Decreased ability to diagnose cancer in the lining of  the uterus. BEFORE THE PROCEDURE  The lining of the uterus must be tested to make sure there is no pre-cancerous or cancer cells present.  An ultrasound may be performed to look at the size of the uterus and to check for abnormalities.  Medicines may be given to thin the lining of the uterus. PROCEDURE  During the procedure, your health care provider will use a tool called a resectoscope to help see inside your uterus. There are different ways to remove the lining of your uterus.   Radiofrequency  This method uses a radiofrequency-alternating electric current to remove the lining of the uterus.  Cryotherapy This method uses extreme cold to freeze the lining of the uterus.  Heated-Free Liquid  This method uses heated salt (saline) solution to remove the lining of the uterus.  Microwave This method uses high-energy microwaves to heat up the lining of the uterus to remove it.  Thermal balloon  This method involves inserting a catheter with a balloon tip into the uterus. The balloon tip is filled with heated fluid to remove the lining of the uterus. AFTER THE PROCEDURE  After your procedure, do not have sexual intercourse or insert anything into your vagina until permitted by your health care provider. After the procedure, you may experience:  Cramps.  Vaginal discharge.  Frequent urination. Document Released: 08/03/2004 Document Revised: 05/27/2013 Document Reviewed: 02/25/2013 [facility] Patient Information 2014 [employer].   PATIENT INSTRUCTIONS POST-ANESTHESIA  IMMEDIATELY FOLLOWING SURGERY:  Do not drive or operate machinery for the first twenty four hours after surgery.  Do not make any important decisions for twenty four hours after surgery or while taking narcotic pain medications or  sedatives.  If you develop intractable nausea and vomiting or a severe headache please notify your doctor immediately.  FOLLOW-UP:  Please make an appointment with your surgeon as instructed.  You do not need to follow up with anesthesia unless specifically instructed to do so.  WOUND CARE INSTRUCTIONS (if applicable):  Keep a dry clean dressing on the anesthesia/puncture wound site if there is drainage.  Once the wound has quit draining you may leave it open to air.  Generally you should leave the bandage intact for twenty four hours unless there is drainage.  If the epidural site drains for more than 36-48 hours please call the anesthesia department.  QUESTIONS?:  Please feel free to call your physician or the hospital operator if you have any questions, and they will be happy to assist you.

## 2014-01-07 ENCOUNTER — Telehealth: Payer: Self-pay | Admitting: *Deleted

## 2014-01-07 NOTE — Addendum Note (Signed)
Addendum created 01/07/14 1916 by Charmaine Downs, CRNA   Modules edited: Anesthesia Flowsheet

## 2014-01-07 NOTE — Telephone Encounter (Signed)
Pt wants to know about the prempro. Does she stop the pills or continue taking them . Pt states she took her last pill on Monday and is doing ok without them, pt has no refills. I spoke with Anderson Malta and she advised that if she hasn't had the medication since Monday to not worry about taking it and getting a refill until she sees Dr. Elonda Husky. Pt verbalized understanding. Pt will discuss medication with Dr. Elonda Husky when she sees him on Thursday.

## 2014-01-08 ENCOUNTER — Other Ambulatory Visit: Payer: Self-pay | Admitting: Adult Health

## 2014-01-11 ENCOUNTER — Telehealth: Payer: Self-pay | Admitting: Obstetrics & Gynecology

## 2014-01-12 ENCOUNTER — Encounter (HOSPITAL_COMMUNITY): Payer: Self-pay | Admitting: Obstetrics & Gynecology

## 2014-01-13 ENCOUNTER — Telehealth: Payer: Self-pay | Admitting: Obstetrics & Gynecology

## 2014-01-13 ENCOUNTER — Other Ambulatory Visit: Payer: Self-pay | Admitting: *Deleted

## 2014-01-13 NOTE — Telephone Encounter (Signed)
Pt was calling to ask if it was OK for her to walk.  States not having any bleeding and has occasional cramps.  Informed her to walk as tolerated, pt states she has an appointment tomorrow with Dr. Elonda Husky and will discuss further with him at that time, also states she is out of her Primpro.  There is a refill request from [facility] already pending on the medication.

## 2014-01-13 NOTE — Telephone Encounter (Signed)
Pt spoke with nurse today and has an appointment tomorrow with Dr. Elonda Husky.

## 2014-01-14 ENCOUNTER — Encounter: Payer: Self-pay | Admitting: Obstetrics & Gynecology

## 2014-01-14 ENCOUNTER — Ambulatory Visit (INDEPENDENT_AMBULATORY_CARE_PROVIDER_SITE_OTHER): Payer: Medicaid Other | Admitting: Obstetrics & Gynecology

## 2014-01-14 VITALS — BP 118/70 | Wt 198.0 lb

## 2014-01-14 DIAGNOSIS — N950 Postmenopausal bleeding: Secondary | ICD-10-CM

## 2014-01-14 DIAGNOSIS — Z9889 Other specified postprocedural states: Secondary | ICD-10-CM

## 2014-01-14 DIAGNOSIS — N840 Polyp of corpus uteri: Secondary | ICD-10-CM

## 2014-01-14 MED ORDER — CONJ ESTROG-MEDROXYPROGEST ACE 0.625-2.5 MG PO TABS: 1.0000 | ORAL_TABLET | Freq: Every day | ORAL | Status: DC

## 2014-01-14 NOTE — Progress Notes (Signed)
Patient ID: THRESSA SHIFFER, female   DOB: November 22, 1960, 53 y.o.   MRN: [id number] 1 week post op hysteroscopy with removal of 3 polyps and ablation  Exam today is normal No infection  Follow up 1 year  Past Medical History  Diagnosis Date  . Allergy   . Depression   . GERD (gastroesophageal reflux disease)   . Anxiety   . Hyperlipidemia   . Vaginal itching 08/05/2013  . Yeast infection 08/05/2013  . Peri-menopause 08/05/2013  . Hot flashes 09/01/2013  . PMB (postmenopausal bleeding) 11/16/2013  . Mild mental slowing     Past Surgical History  Procedure Laterality Date  . No past surgeries    . Colonoscopy  09/19/2012    Procedure: COLONOSCOPY;  Surgeon: Danie Binder, MD;  Location: [facility];  Service: Endoscopy;  Laterality: N/A;  8:30 AM  . Dilitation & currettage/hystroscopy with thermachoice ablation N/A 01/06/2014    Procedure: DILATATION & CURETTAGE/HYSTEROSCOPY WITH THERMACHOICE ABLATION;  Surgeon: Florian Buff, MD;  Location: [facility];  Service: Gynecology;  Laterality: N/A;  total therapy time:  9 minutes 12 seconds ,     temperature:87 degrees  . Cervical polypectomy N/A 01/06/2014    Procedure: CERVICAL POLYPECTOMY;  Surgeon: Florian Buff, MD;  Location: [facility];  Service: Gynecology;  Laterality: N/A;    OB History   Grav Para Term Preterm Abortions TAB SAB Ect Mult Living   1 1 1       1       Allergies  Allergen Reactions  . Benadryl [Diphenhydramine Hcl] Other (See Comments)    Makes congestion worst   . Phenol-Glycerin Other (See Comments)    Makes congestion worst.   . Aspirin Rash    rash  . Latex Rash    History   Social History  . Marital Status: Single    Spouse Name: N/A    Number of Children: N/A  . Years of Education: N/A   Social History Main Topics  . Smoking status: Former Smoker -- 0.25 packs/day for 3 years    Types: Cigarettes    Quit date: 09/03/1981  . Smokeless tobacco: Never Used  . Alcohol Use: No  . Drug Use: No  .  Sexual Activity: No   Other Topics Concern  . None   Social History Narrative  . None    Family History  Problem Relation Age of Onset  . Diabetes Father   . Heart disease Father   . Hypertension Father   . Colon cancer Neg Hx   . Cancer Neg Hx   . Diabetes Maternal Uncle   . Anemia Maternal Uncle   . Arthritis Maternal Uncle

## 2014-01-15 ENCOUNTER — Telehealth: Payer: Self-pay | Admitting: Obstetrics & Gynecology

## 2014-01-18 ENCOUNTER — Telehealth: Payer: Self-pay | Admitting: Family Medicine

## 2014-01-18 NOTE — Telephone Encounter (Signed)
Will forward to MD.  

## 2014-01-18 NOTE — Telephone Encounter (Signed)
FYI

## 2014-01-19 ENCOUNTER — Telehealth: Payer: Self-pay

## 2014-01-19 NOTE — Telephone Encounter (Signed)
duplicate

## 2014-01-19 NOTE — Telephone Encounter (Signed)
Pt states when you are on HRT do you take birth control pills. Pt advised she does not need to be on birth control pills with her HRT. Pt verbalized understanding.

## 2014-01-21 ENCOUNTER — Telehealth: Payer: Self-pay

## 2014-01-21 NOTE — Telephone Encounter (Signed)
Opened in error

## 2014-01-25 NOTE — Telephone Encounter (Signed)
Left message for patient to return call.

## 2014-01-25 NOTE — Telephone Encounter (Signed)
pls send for last note from her dermatologist to see what "shot ' she is talking about. I am not aware of any significant rash, I am assuming she is nreferring to depo medrol but need to get more info before I can give a specific answer, pls explain

## 2014-01-26 ENCOUNTER — Telehealth: Payer: Self-pay | Admitting: Family Medicine

## 2014-01-28 ENCOUNTER — Telehealth: Payer: Self-pay

## 2014-01-28 NOTE — Telephone Encounter (Signed)
Spoke with patient and she has made an appt with Dr. Nevada Crane for 7/9

## 2014-01-28 NOTE — Telephone Encounter (Signed)
Called and left message for patient to return call.  

## 2014-01-28 NOTE — Telephone Encounter (Signed)
No answer

## 2014-02-01 NOTE — Telephone Encounter (Signed)
Pt has questions about HRT has some paper, told her to bring to next appt.

## 2014-02-02 ENCOUNTER — Telehealth: Payer: Self-pay | Admitting: Obstetrics & Gynecology

## 2014-02-02 NOTE — Telephone Encounter (Signed)
Pt states not satisfied with the Prempro would like to get the "cyclical HRT." Discussed with Derrek Monaco, NP pt to keep her appt and will discuss difference between cyclical and continuous HRT. Pt verbalized understanding.

## 2014-02-10 ENCOUNTER — Telehealth: Payer: Self-pay | Admitting: Adult Health

## 2014-02-10 ENCOUNTER — Telehealth: Payer: Self-pay | Admitting: *Deleted

## 2014-02-10 NOTE — Telephone Encounter (Signed)
Spoke with pt. Pt has been on Prempro but stopped it last week. I advised she shouldn't have stopped it if she wasn't advised to. Pt to start back on that med. Pt had a question about continuous hormone therapy vs cyclic hormone therapy. I advised that was something that she needed to discuss at her next appt. Pt voiced understanding. [place]

## 2014-02-10 NOTE — Telephone Encounter (Signed)
Has question about NHRT, bring info to appt will not discuss over phone

## 2014-02-10 NOTE — Telephone Encounter (Signed)
Pt has called again wanting to know about the hormone replacement options.  She states that she is on PremPro and after reading about it online she is concerned that it is possible to have some break through bleeding, she reports that she has not had any bleeding.  She wants a natural hormone replacement that she has read about online, she states the name of it is NHRT.  She also wants to know if there is an injectable hormone replacement that she can try.  I answered all questions that best I could (was on the phone for 18 minutes with her) but she still wanted to hear from you if you would Rx the NHRT, please advise.

## 2014-03-04 ENCOUNTER — Telehealth: Payer: Self-pay | Admitting: Family Medicine

## 2014-03-05 NOTE — Telephone Encounter (Signed)
Patient left message with Chelsey do not worry about this WT

## 2014-03-05 NOTE — Telephone Encounter (Signed)
Concern addressed

## 2014-03-19 NOTE — Telephone Encounter (Signed)
Per t/c from patient, she handled changing 2014 appt time with Dr. Towanda Malkin herself and called [facility] to confirm.

## 2014-04-09 ENCOUNTER — Other Ambulatory Visit: Payer: Self-pay | Admitting: Adult Health

## 2014-04-10 NOTE — Assessment & Plan Note (Signed)
Hyperlipidemia:Low fat diet discussed and encouraged.  Dietary management only, review labs after 12 month

## 2014-04-10 NOTE — Assessment & Plan Note (Signed)
Improved. Pt applauded on succesful weight loss through lifestyle change, and encouraged to continue same. Weight loss goal set for the next several months.  

## 2014-04-10 NOTE — Assessment & Plan Note (Signed)
Controlled, no change in medication  

## 2014-04-13 ENCOUNTER — Ambulatory Visit: Payer: Medicaid Other | Admitting: Family Medicine

## 2014-04-19 ENCOUNTER — Ambulatory Visit: Payer: Medicaid Other | Admitting: Adult Health

## 2014-04-26 ENCOUNTER — Ambulatory Visit: Payer: Medicaid Other | Admitting: Adult Health

## 2014-04-26 ENCOUNTER — Telehealth: Payer: Self-pay | Admitting: *Deleted

## 2014-04-26 NOTE — Telephone Encounter (Signed)
Spoke with pt. Pt is wanting to change her Prempro to something different. I advised she would need an appt before any meds are changed. Pt voiced understanding. Call transferred to front desk for appt. [place]

## 2014-04-29 ENCOUNTER — Ambulatory Visit: Payer: Medicaid Other | Admitting: Family Medicine

## 2014-05-12 ENCOUNTER — Ambulatory Visit: Payer: Medicaid Other | Admitting: Adult Health

## 2014-05-17 ENCOUNTER — Encounter: Payer: Self-pay | Admitting: Adult Health

## 2014-05-17 ENCOUNTER — Ambulatory Visit (INDEPENDENT_AMBULATORY_CARE_PROVIDER_SITE_OTHER): Payer: Medicaid Other | Admitting: Adult Health

## 2014-05-17 ENCOUNTER — Other Ambulatory Visit (HOSPITAL_COMMUNITY)
Admission: RE | Admit: 2014-05-17 | Discharge: 2014-05-17 | Disposition: A | Discharge status: Home / Self Care | Payer: Medicaid Other | Source: Ambulatory Visit | Attending: Adult Health | Admitting: Adult Health

## 2014-05-17 VITALS — BP 120/78 | Ht 63.5 in | Wt 185.5 lb

## 2014-05-17 DIAGNOSIS — R87810 Cervical high risk human papillomavirus (HPV) DNA test positive: Secondary | ICD-10-CM

## 2014-05-17 DIAGNOSIS — Z1151 Encounter for screening for human papillomavirus (HPV): Secondary | ICD-10-CM | POA: Diagnosis present

## 2014-05-17 DIAGNOSIS — Z01419 Encounter for gynecological examination (general) (routine) without abnormal findings: Secondary | ICD-10-CM | POA: Diagnosis present

## 2014-05-17 DIAGNOSIS — Z124 Encounter for screening for malignant neoplasm of cervix: Secondary | ICD-10-CM

## 2014-05-17 DIAGNOSIS — Z1212 Encounter for screening for malignant neoplasm of rectum: Secondary | ICD-10-CM

## 2014-05-17 DIAGNOSIS — Z139 Encounter for screening, unspecified: Secondary | ICD-10-CM

## 2014-05-17 DIAGNOSIS — Z79890 Hormone replacement therapy (postmenopausal): Secondary | ICD-10-CM

## 2014-05-17 HISTORY — DX: Cervical high risk human papillomavirus (HPV) DNA test positive: R87.810

## 2014-05-17 LAB — HEMOCCULT GUIAC POC 1CARD (OFFICE): Fecal Occult Blood, POC: NEGATIVE

## 2014-05-17 NOTE — Progress Notes (Signed)
Subjective:     Patient ID: Karen Cox, female   DOB: 1961/08/01, 53 y.o.   MRN: [id number]  HPI Karen Cox is a 53 year old black female in for pap and pelvic exam, no complaints, she is losing weight to get breast reduction.She has lost almost a 100 lbs since 2011.She has a lot of questions on her HRT. 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  Review of Systems See HPI Reviewed past medical,surgical, social and family history. Reviewed medications and allergies.     Objective:   Physical Exam BP 120/78  Ht 5' 3.5" (1.613 m)  Wt 185 lb 8 oz (84.142 kg)  BMI 32.34 kg/m2  LMP 06/27/2013   Skin warm and dry.Pelvic: external genitalia is normal in appearance, vagina: white discharge without odor, cervix:smooth and bulbous, pap with HPV performed, uterus: normal size, shape and contour, non tender, no masses felt, adnexa: no masses or tenderness noted. On rectal exam she has good sphincter tone and no masses or polyps or hemorrhoids and her hemoccult was negative. Tried to answer her questions about HRT and she will continue Prempro .625 mg-2.5 mg 1 daily.  Assessment:     Pap smear History of +HPV HRT    Plan:    Continue prempro Follow up in 1 year Review handout on HRT See Dr Moshe Cipro 8/20 for physical.

## 2014-05-17 NOTE — Patient Instructions (Signed)

## 2014-05-18 LAB — CYTOLOGY - PAP

## 2014-05-21 ENCOUNTER — Other Ambulatory Visit: Payer: Self-pay | Admitting: Adult Health

## 2014-05-25 ENCOUNTER — Telehealth: Payer: Self-pay | Admitting: Adult Health

## 2014-05-25 NOTE — Telephone Encounter (Signed)
Pt asked if prempro came in injection form. Informed pt that to my knowledge prempro did not come as an injectable. Pt verbalized understanding.

## 2014-05-27 ENCOUNTER — Encounter: Payer: Self-pay | Admitting: Family Medicine

## 2014-05-27 ENCOUNTER — Other Ambulatory Visit: Payer: Self-pay | Admitting: Family Medicine

## 2014-05-27 ENCOUNTER — Ambulatory Visit (INDEPENDENT_AMBULATORY_CARE_PROVIDER_SITE_OTHER): Payer: Medicaid Other | Admitting: Family Medicine

## 2014-05-27 VITALS — BP 120/80 | HR 80 | Resp 16 | Ht 65.0 in | Wt 189.0 lb

## 2014-05-27 DIAGNOSIS — J302 Other seasonal allergic rhinitis: Secondary | ICD-10-CM

## 2014-05-27 DIAGNOSIS — F329 Major depressive disorder, single episode, unspecified: Secondary | ICD-10-CM

## 2014-05-27 DIAGNOSIS — R5383 Other fatigue: Secondary | ICD-10-CM

## 2014-05-27 DIAGNOSIS — F32A Depression, unspecified: Secondary | ICD-10-CM

## 2014-05-27 DIAGNOSIS — E669 Obesity, unspecified: Secondary | ICD-10-CM

## 2014-05-27 DIAGNOSIS — Z1231 Encounter for screening mammogram for malignant neoplasm of breast: Secondary | ICD-10-CM

## 2014-05-27 DIAGNOSIS — N62 Hypertrophy of breast: Secondary | ICD-10-CM

## 2014-05-27 DIAGNOSIS — F419 Anxiety disorder, unspecified: Secondary | ICD-10-CM

## 2014-05-27 DIAGNOSIS — Z1239 Encounter for other screening for malignant neoplasm of breast: Secondary | ICD-10-CM

## 2014-05-27 DIAGNOSIS — E785 Hyperlipidemia, unspecified: Secondary | ICD-10-CM

## 2014-05-27 DIAGNOSIS — R5381 Other malaise: Secondary | ICD-10-CM

## 2014-05-27 NOTE — Patient Instructions (Signed)
F/u in January, call if you need me before  You are referred for mammogram and plastic surgery opiniopn per your request.  Congrats on healthier weight .  TSH, fasting lipid and chem 7 in January before visit

## 2014-05-29 DIAGNOSIS — E785 Hyperlipidemia, unspecified: Secondary | ICD-10-CM | POA: Insufficient documentation

## 2014-05-29 NOTE — Assessment & Plan Note (Signed)
Controlled, no change in medication  

## 2014-05-29 NOTE — Assessment & Plan Note (Signed)
Hyperlipidemia:Low fat diet discussed and encouraged.  Updated lab needed at/ before next visit.  

## 2014-05-29 NOTE — Progress Notes (Signed)
   Subjective:    Patient ID: Karen Cox, female    DOB: 1961/05/21, 53 y.o.   MRN: [id number]  HPI The PT is here for follow up and re-evaluation of chronic medical conditions, medication management and review of any available recent lab and radiology data.  Preventive health is updated, specifically  Cancer screening and Immunization.Needs mammogram   Had gyne procedure for endometrial polyp since last seen. The PT denies any adverse reactions to current medications since the last visit.  Requests 2nd opinoin for her surgery for gynecomastia as she is being told she needs to lose an additional 30 pounds Walks regularly, and has changed her diet with excellent weight loss success      Review of Systems See HPI Denies recent fever or chills. Denies sinus pressure, nasal congestion, ear pain or sore throat. Denies chest congestion, productive cough or wheezing. Denies chest pains, palpitations and leg swelling Denies abdominal pain, nausea, vomiting,diarrhea or constipation.   Denies dysuria, frequency, hesitancy or incontinence. Denies joint pain, swelling and limitation in mobility. Denies headaches, seizures, numbness, or tingling. Denies depression, uncontrolled  anxiety or insomnia. Denies skin break down or rash.        Objective:   Physical Exam BP 120/80  Pulse 80  Resp 16  Ht 5\' 5"  (1.651 m)  Wt 189 lb (85.73 kg)  BMI 31.45 kg/m2  SpO2 100%  LMP 06/27/2013 Patient alert and oriented and in no cardiopulmonary distress.  HEENT: No facial asymmetry, EOMI,   oropharynx pink and moist.  Neck supple no JVD, no mass.  Chest: Clear to auscultation bilaterally.  CVS: S1, S2 no murmurs, no S3.Regular rate.  ABD: Soft non tender.   Ext: No edema  MS: Adequate ROM spine, shoulders, hips and knees.  Skin: Intact, no ulcerations or rash noted.  Psych: Good eye contact, normal affect. Memory intact  Anxious not depressed appearing.  CNS: CN 2-12 intact,  power,  normal throughout.no focal deficits noted.        Assessment & Plan:  Seasonal allergies Controlled, no change in medication   Anxiety and depression Controlled, no change in medication  Followed by psychiatry, currently on no antidepressants or antipsychotic agents and doing well  OBESITY Improved. Pt applauded on succesful weight loss through lifestyle change, and encouraged to continue same. Weight loss goal set for the next several months.   Gynecomastia Still a problem , chronic upper back pain and marked gynecomastia, which  Would qualify her for reduction, ins coverage a challenge. States she needs 2nd plastic opinion , as she has  Now been told that she needs to lose an additional 30 pounds to have the surgery, will refer to any accepting Provider, pt prefers female provider and this has been passed on in the referral  Dyslipidemia Hyperlipidemia:Low fat diet discussed and encouraged.  Updated lab needed at/ before next visit.

## 2014-05-29 NOTE — Assessment & Plan Note (Signed)
Still a problem , chronic upper back pain and marked gynecomastia, which  Would qualify her for reduction, ins coverage a challenge. States she needs 2nd plastic opinion , as she has  Now been told that she needs to lose an additional 30 pounds to have the surgery, will refer to any accepting Provider, pt prefers female provider and this has been passed on in the referral

## 2014-05-29 NOTE — Assessment & Plan Note (Signed)
Controlled, no change in medication  Followed by psychiatry, currently on no antidepressants or antipsychotic agents and doing well

## 2014-05-29 NOTE — Assessment & Plan Note (Signed)
Improved. Pt applauded on succesful weight loss through lifestyle change, and encouraged to continue same. Weight loss goal set for the next several months.  

## 2014-06-02 ENCOUNTER — Telehealth: Payer: Self-pay

## 2014-06-02 DIAGNOSIS — L309 Dermatitis, unspecified: Secondary | ICD-10-CM

## 2014-06-02 NOTE — Telephone Encounter (Signed)
Referral entered  

## 2014-06-07 ENCOUNTER — Telehealth: Payer: Self-pay

## 2014-06-07 ENCOUNTER — Telehealth: Payer: Self-pay | Admitting: Adult Health

## 2014-06-07 NOTE — Telephone Encounter (Signed)
Left message x 1. JSY 

## 2014-06-08 ENCOUNTER — Telehealth: Payer: Self-pay | Admitting: Adult Health

## 2014-06-08 NOTE — Telephone Encounter (Signed)
No answer @ 2:11 pm. JSY

## 2014-06-08 NOTE — Telephone Encounter (Signed)
Spoke with pt. Pt wants to discuss hormone pills. She is wanting to start a med that pt says [facility] won't start her on. I advised that pt would need an appt to discuss this with a provider. Appt scheduled with Kendrick Fries at front desk. [place]

## 2014-06-08 NOTE — Telephone Encounter (Signed)
Left message x 2. JSY 

## 2014-06-08 NOTE — Telephone Encounter (Signed)
Spoke with pt. Pt wants to start on a hormone med and states [facility] won't start her on it. I advised pt would need to discuss with one of the providers. Appt was scheduled with Kendrick Fries at front desk. [place]

## 2014-06-18 NOTE — Telephone Encounter (Signed)
Patient had called the office in [facility] and is aware that I could not find anyone and she will wait another year so she can lose down to 150 pounds

## 2014-06-24 ENCOUNTER — Ambulatory Visit (HOSPITAL_COMMUNITY): Payer: Medicaid Other

## 2014-06-28 ENCOUNTER — Ambulatory Visit: Payer: Medicaid Other | Admitting: Obstetrics and Gynecology

## 2014-07-02 ENCOUNTER — Ambulatory Visit: Payer: Medicaid Other | Admitting: Obstetrics and Gynecology

## 2014-07-12 ENCOUNTER — Ambulatory Visit (HOSPITAL_COMMUNITY): Payer: Medicaid Other

## 2014-07-16 ENCOUNTER — Ambulatory Visit: Payer: Medicaid Other | Admitting: Obstetrics and Gynecology

## 2014-07-19 ENCOUNTER — Ambulatory Visit (HOSPITAL_COMMUNITY): Payer: Medicaid Other

## 2014-07-21 ENCOUNTER — Ambulatory Visit: Payer: Medicaid Other | Admitting: Obstetrics and Gynecology

## 2014-07-23 ENCOUNTER — Ambulatory Visit: Payer: Medicaid Other | Admitting: Obstetrics and Gynecology

## 2014-08-02 ENCOUNTER — Ambulatory Visit (HOSPITAL_COMMUNITY): Payer: Medicaid Other

## 2014-08-09 ENCOUNTER — Encounter: Payer: Self-pay | Admitting: Family Medicine

## 2014-08-16 ENCOUNTER — Ambulatory Visit (HOSPITAL_COMMUNITY)
Admission: RE | Admit: 2014-08-16 | Discharge: 2014-08-16 | Disposition: A | Discharge status: Home / Self Care | Payer: Medicaid Other | Source: Ambulatory Visit | Attending: Family Medicine | Admitting: Family Medicine

## 2014-08-16 ENCOUNTER — Ambulatory Visit (HOSPITAL_COMMUNITY): Admission: RE | Admit: 2014-08-16 | Payer: Medicaid Other | Source: Ambulatory Visit

## 2014-08-16 DIAGNOSIS — Z1231 Encounter for screening mammogram for malignant neoplasm of breast: Secondary | ICD-10-CM | POA: Diagnosis present

## 2014-08-16 DIAGNOSIS — Z1239 Encounter for other screening for malignant neoplasm of breast: Secondary | ICD-10-CM

## 2014-08-19 ENCOUNTER — Ambulatory Visit (INDEPENDENT_AMBULATORY_CARE_PROVIDER_SITE_OTHER): Payer: Medicaid Other | Admitting: Obstetrics and Gynecology

## 2014-08-19 VITALS — BP 120/80 | Ht 65.0 in | Wt 191.0 lb

## 2014-08-19 DIAGNOSIS — N760 Acute vaginitis: Secondary | ICD-10-CM

## 2014-08-19 DIAGNOSIS — R5383 Other fatigue: Secondary | ICD-10-CM

## 2014-08-19 DIAGNOSIS — A499 Bacterial infection, unspecified: Secondary | ICD-10-CM

## 2014-08-19 DIAGNOSIS — B9689 Other specified bacterial agents as the cause of diseases classified elsewhere: Secondary | ICD-10-CM

## 2014-08-19 DIAGNOSIS — Z780 Asymptomatic menopausal state: Secondary | ICD-10-CM | POA: Insufficient documentation

## 2014-08-19 LAB — CBC
HCT: 37.4 % (ref 36.0–46.0)
HEMOGLOBIN: 11.9 g/dL — AB (ref 12.0–15.0)
MCH: 26.9 pg (ref 26.0–34.0)
MCHC: 31.8 g/dL (ref 30.0–36.0)
MCV: 84.4 fL (ref 78.0–100.0)
Platelets: 318 10*3/uL (ref 150–400)
RBC: 4.43 MIL/uL (ref 3.87–5.11)
RDW: 13.8 % (ref 11.5–15.5)
WBC: 6.3 10*3/uL (ref 4.0–10.5)

## 2014-08-19 LAB — COMPREHENSIVE METABOLIC PANEL
ALT: 9 U/L (ref 0–35)
AST: 15 U/L (ref 0–37)
Albumin: 3.9 g/dL (ref 3.5–5.2)
Alkaline Phosphatase: 68 U/L (ref 39–117)
BUN: 9 mg/dL (ref 6–23)
CALCIUM: 9.3 mg/dL (ref 8.4–10.5)
CO2: 23 mEq/L (ref 19–32)
CREATININE: 0.7 mg/dL (ref 0.50–1.10)
Chloride: 105 mEq/L (ref 96–112)
Glucose, Bld: 85 mg/dL (ref 70–99)
POTASSIUM: 4.5 meq/L (ref 3.5–5.3)
Sodium: 138 mEq/L (ref 135–145)
Total Bilirubin: 0.7 mg/dL (ref 0.2–1.2)
Total Protein: 6.8 g/dL (ref 6.0–8.3)

## 2014-08-19 LAB — TSH: TSH: 1.185 u[IU]/mL (ref 0.350–4.500)

## 2014-08-19 MED ORDER — METRONIDAZOLE 0.75 % VA GEL: 1.0000 | Freq: Every day | VAGINAL | Status: DC

## 2014-08-19 NOTE — Patient Instructions (Signed)
Stop prempro x 1 week followup lab, and response to metrogel in 2 wk

## 2014-08-19 NOTE — Progress Notes (Signed)
Patient ID: Karen Cox, female   DOB: 1960/11/19, 53 y.o.   MRN: [id number]   [facility]  Patient name: Karen Cox MRN [id number]  Date of birth: 06/09/1961 Challenging historian CC & HPI:  Karen Cox is a 53 y.o. female presenting today for intermittent vaginal itching and burning that started 2 days ago.  She is also complaining of a rash that may be due to an allergic reaction to medication and is in need of blood work.  She has not been sexually active in a "long time".  She has been taking Prempro tablets since December 2014 but is not satisfied with the medication because she is not having a menses.  She is no longer having hot sweats.  She is not applying any medications to her vaginally.    ROS:  All systems have been reviewed and are negative unless otherwise specified in the HPI.  Constipation -  1 year; resolved with Miralax  Pertinent History Reviewed:   Reviewed: Significant for  Medical         Past Medical History  Diagnosis Date  . Allergy   . Depression   . GERD (gastroesophageal reflux disease)   . Anxiety   . Hyperlipidemia   . Vaginal itching 08/05/2013  . Yeast infection 08/05/2013  . Peri-menopause 08/05/2013  . Hot flashes 09/01/2013  . PMB (postmenopausal bleeding) 11/16/2013  . Mild mental slowing   . Cervical high risk HPV (human papillomavirus) test positive 05/17/2014                              Surgical Hx:    Past Surgical History  Procedure Laterality Date  . No past surgeries    . Colonoscopy  09/19/2012    Procedure: COLONOSCOPY;  Surgeon: Danie Binder, MD;  Location[facility];  Service: Endoscopy;  Laterality: N/A;  8:30 AM  . Dilitation & currettage/hystroscopy with thermachoice ablation N/A 01/06/2014    Procedure: DILATATION & CURETTAGE/HYSTEROSCOPY WITH THERMACHOICE ABLATION;  Surgeon: Florian Buff, MD;  Location: [facility];  Service: Gynecology;  Laterality: N/A;  total therapy time:  9 minutes 12  seconds ,     temperature:87 degrees  . Cervical polypectomy N/A 01/06/2014    Procedure: CERVICAL POLYPECTOMY;  Surgeon: Florian Buff, MD;  Location: [facility];  Service: Gynecology;  Laterality: N/A;   Medications: Reviewed & Updated - see associated section                      Current outpatient prescriptions: estrogen, conjugated,-medroxyprogesterone (PREMPRO) 0.625-2.5 MG per tablet, Take 1 tablet by mouth daily., Disp: 30 tablet, Rfl: 3;  fluconazole (DIFLUCAN) 150 MG tablet, TAKE 1 TABLET BY MOUTH NOW, THEN TAKE 1 TABLET IN 3 DAYS., Disp: 2 tablet, Rfl: 1;  hydrOXYzine (ATARAX/VISTARIL) 50 MG tablet, Take 100 mg by mouth 2 (two) times daily., Disp: , Rfl:  levocetirizine (XYZAL) 5 MG tablet, Take 5 mg by mouth every evening., Disp: , Rfl: ;  loratadine (CLARITIN) 10 MG tablet, Take 20 mg by mouth daily. , Disp: , Rfl: ;  triamcinolone ointment (KENALOG) 0.1 %, Apply 1 application topically 3 (three) times daily., Disp: , Rfl:    Social History: Reviewed -  reports that she quit smoking about 32 years ago. Her smoking use included Cigarettes. She has a .75 pack-year smoking history. She has never used smokeless tobacco.  Objective  Findings:  Vitals: Blood pressure 120/80, height 5\' 5"  (1.651 m), weight 191 lb (86.637 kg), last menstrual period 06/27/2013.  Physical Examination: General appearance - alert, well appearing, and in no distress, oriented to person, place, and time and overweight Pelvic - VULVA: normal appearing vulva with no masses, tenderness or lesions,  VAGINA: normal appearing vagina with normal color and discharge, no lesions, generous white secretions, no skin irritation, good support CERVIX: normal appearing cervix without discharge or lesions, non-tender, bimanual exam is normal UTERUS: uterus is normal size, shape, consistency and nontender,  ADNEXA: normal adnexa in size, nontender and no masses   Assessment & Plan:   A:  1. Normal exam 2. Increased secretions 3.  BV 4. Pt has a desire to switch up hormone tx. P:  1. Metrogel  2. Follow-up in 2 wks to discuss hormone changes  This chart was scribed for Jonnie Kind, MD by Donato Schultz, ED Scribe. This patient was seen in Room 2 and the patient's care was started at 9:51 AM.

## 2014-08-20 LAB — GLUCOSE 6 PHOSPHATE DEHYDROGENASE: G-6PDH: 6.6 U/g{Hb} — AB (ref 7.0–20.5)

## 2014-08-20 LAB — SEDIMENTATION RATE: Sed Rate: 9 mm/hr (ref 0–22)

## 2014-08-23 ENCOUNTER — Ambulatory Visit (HOSPITAL_COMMUNITY): Payer: Medicaid Other

## 2014-08-25 ENCOUNTER — Telehealth: Payer: Self-pay | Admitting: Obstetrics and Gynecology

## 2014-08-25 NOTE — Telephone Encounter (Signed)
Pt aware to start using medication and that he will discuss all of her questions at her next visit. Pt verbalized understanding.

## 2014-08-25 NOTE — Telephone Encounter (Signed)
Pt states that the skin doctor told her that the prempro could be causing the itching and rash. Pt states that she has had a rash and itching for over 14 years. Pt was told by Dr. Glo Herring to stop taking the prempro. Pt states that she thinks she needs some type of hormone. Pt's conversation was very scattered. Pt stated that she wanted to know what Dr. Glo Herring meant by the bowels and then using the metrogel.   I spoke with Dr.Ferguson and he advised to take the metrogel and follow up on any questions regarding the bowels at her follow up appointment.

## 2014-09-13 ENCOUNTER — Ambulatory Visit (INDEPENDENT_AMBULATORY_CARE_PROVIDER_SITE_OTHER): Payer: Medicaid Other | Admitting: Obstetrics and Gynecology

## 2014-09-13 VITALS — BP 118/70 | Ht 65.0 in | Wt 191.5 lb

## 2014-09-13 DIAGNOSIS — N898 Other specified noninflammatory disorders of vagina: Secondary | ICD-10-CM

## 2014-09-13 DIAGNOSIS — N959 Unspecified menopausal and perimenopausal disorder: Secondary | ICD-10-CM

## 2014-09-13 LAB — POCT WET PREP (WET MOUNT)
BACTERIA WET PREP HPF POC: NORMAL
KOH WET PREP POC: NEGATIVE
TRICHOMONAS WET PREP HPF POC: NEGATIVE

## 2014-09-13 NOTE — Progress Notes (Signed)
Patient ID: Karen Cox, female   DOB: [birth year], 53 y.o.   MRN: [id number] Pt here today for follow up. Pt states that she needs to have her medication switched from the prempro she is allergic to it per her dermatologist. Pt wants to have her hormones tested.

## 2014-09-13 NOTE — Progress Notes (Signed)
Patient ID: Karen Cox, female   DOB: [birth year], 53 y.o.   MRN: [id number]   [facility] Visit  Patient name: Karen Cox MRN [id number]  Date of birth: [birth year]  CC & HPI:  DEMIKA Cox is a 53 y.o. female presenting today to discuss her hormone therapy.  Patient has a 15 year history of itching and was began on Prempro of December of last year.  She states that she developed a rash in April and suspects that she was allergic to the medication.  She states that the Prempro did help with her menopausal symptoms but she would still like to have a menses.  She is no longer taking Prempro and did not experience any vaginal bleeding after she stopped taking the medication.  She was using Kenalog vaginally but has since finished the medication.  She is not experiencing any vaginal itching currently.   She saw [facility] Marline Backbone at Dr Payton [facility] office, and thinks she was told she is ALLERGIC to prempro., though this seems illogical. Pt brings 6 pages of disorganized notes over hormone tx, She WANTS A TOPICAL PROGESTERONE TX. Pt is off all HT x over a week, so will check FSH and estradiol levels. ROS:  All systems have been reviewed and are negative unless otherwise specified in the HPI.  Pertinent History Reviewed:   Reviewed: Significant for  Medical         Past Medical History  Diagnosis Date  . Allergy   . Depression   . GERD (gastroesophageal reflux disease)   . Anxiety   . Hyperlipidemia   . Vaginal itching 08/05/2013  . Yeast infection 08/05/2013  . Peri-menopause 08/05/2013  . Hot flashes 09/01/2013  . PMB (postmenopausal bleeding) 11/16/2013  . Mild mental slowing   . Cervical high risk HPV (human papillomavirus) test positive 05/17/2014                              Surgical Hx:    Past Surgical History  Procedure Laterality Date  . No past surgeries    . Colonoscopy  09/19/2012    Procedure: COLONOSCOPY;  Surgeon: Danie Binder, MD;  Location: [facility];  Service: Endoscopy;  Laterality: N/A;  8:30 AM  . Dilitation & currettage/hystroscopy with thermachoice ablation N/A 01/06/2014    Procedure: DILATATION & CURETTAGE/HYSTEROSCOPY WITH THERMACHOICE ABLATION;  Surgeon: Florian Buff, MD;  Location: [facility];  Service: Gynecology;  Laterality: N/A;  total therapy time:  9 minutes 12 seconds ,     temperature:87 degrees  . Cervical polypectomy N/A 01/06/2014    Procedure: CERVICAL POLYPECTOMY;  Surgeon: Florian Buff, MD;  Location: [facility];  Service: Gynecology;  Laterality: N/A;   Medications: Reviewed & Updated - see associated section                      Current outpatient prescriptions: estrogen, conjugated,-medroxyprogesterone (PREMPRO) 0.625-2.5 MG per tablet, Take 1 tablet by mouth daily., Disp: 30 tablet, Rfl: 3;  fluconazole (DIFLUCAN) 150 MG tablet, TAKE 1 TABLET BY MOUTH NOW, THEN TAKE 1 TABLET IN 3 DAYS., Disp: 2 tablet, Rfl: 1;  hydrOXYzine (ATARAX/VISTARIL) 50 MG tablet, Take 100 mg by mouth 2 (two) times daily., Disp: , Rfl:  levocetirizine (XYZAL) 5 MG tablet, Take 5 mg by mouth every evening., Disp: , Rfl: ;  loratadine (CLARITIN) 10 MG tablet, Take 20 mg by mouth  daily. , Disp: , Rfl: ;  metroNIDAZOLE (METROGEL) 0.75 % vaginal gel, Place 1 Applicatorful vaginally at bedtime. Apply one applicatorful to vagina at bedtime for 5 days, Disp: 70 g, Rfl: 1;  triamcinolone ointment (KENALOG) 0.1 %, Apply 1 application topically 3 (three) times daily., Disp: , Rfl:    Social History: Reviewed -  reports that she quit smoking about 33 years ago. Her smoking use included Cigarettes. She has a .75 pack-year smoking history. She has never used smokeless tobacco.  Objective Findings:  Vitals: Blood pressure 118/70, height 5\' 5"  (1.651 m), weight 191 lb 8 oz (86.864 kg), last menstrual period 06/27/2013.  Physical Examination: General appearance - alert, well appearing, and in no distress, oriented to person, place, and time and  overweight Pelvic - normal external genitalia  VULVA: normal appearing vulva with no masses, tenderness or lesions,  VAGINA: normal appearing vagina with normal color and light discharge, no lesions,  CERVIX: normal appearing cervix without discharge or lesions,  UTERUS: uterus is normal size, shape, consistency and nontender,  ADNEXA: normal adnexa in size, nontender and no masses  Assessment & Plan:   A:  1. Menopause management 2. No evidence of infection  P:  1. Check FSH , estradiol levels. 2.f/u by phone     This chart was scribed for Karen Kind, MD by Donato Schultz, ED Scribe. This patient was seen in Room 2 and the patient's care was started at 9:30 AM.

## 2014-09-14 ENCOUNTER — Telehealth: Payer: Self-pay | Admitting: Family Medicine

## 2014-09-14 LAB — ESTRADIOL: Estradiol: 34.2 pg/mL

## 2014-09-14 LAB — FOLLICLE STIMULATING HORMONE: FSH: 73.9 m[IU]/mL

## 2014-09-14 NOTE — Telephone Encounter (Signed)
Found a Dr That will take medicaid at [facility] the number there is [phone] fax # [phone] wanting me to fax over the referral for her dermatitis they will call her with an appointment patient is aware

## 2014-09-22 ENCOUNTER — Encounter: Payer: Self-pay | Admitting: Obstetrics and Gynecology

## 2014-09-24 ENCOUNTER — Telehealth: Payer: Self-pay | Admitting: Obstetrics and Gynecology

## 2014-09-24 NOTE — Telephone Encounter (Signed)
Pt informed of [place], and estradiol levels from 09/13/2014.

## 2014-09-27 ENCOUNTER — Ambulatory Visit: Payer: Medicaid Other | Admitting: Obstetrics and Gynecology

## 2014-10-04 ENCOUNTER — Telehealth: Payer: Self-pay | Admitting: *Deleted

## 2014-10-04 NOTE — Telephone Encounter (Signed)
Pt wants to discuss different med options with Dr. Glo Herring but she has an appt on 10/15/14. Advised to discuss with Dr at that time. Pt voiced understanding. Karen Cox

## 2014-10-06 ENCOUNTER — Ambulatory Visit: Payer: Medicaid Other | Admitting: Obstetrics and Gynecology

## 2014-10-12 ENCOUNTER — Ambulatory Visit: Payer: Medicaid Other | Admitting: Family Medicine

## 2014-10-15 ENCOUNTER — Ambulatory Visit: Payer: Medicaid Other | Admitting: Obstetrics and Gynecology

## 2014-10-18 ENCOUNTER — Ambulatory Visit: Payer: Medicaid Other | Admitting: Obstetrics and Gynecology

## 2014-10-19 ENCOUNTER — Telehealth: Payer: Self-pay | Admitting: *Deleted

## 2014-10-19 NOTE — Telephone Encounter (Signed)
Pt states that she comes to see Dr. Glo Herring next week. But she wanted to discuss a Rx that he is going to give her. Pt states that Dr.Ferguson told her that she should stop her prempro and that he would give her another kind of the medication. Pt states that she wanted to know if the medication that he was going to give comes in an injection form or in a ring form. Pt had rather not take pills. Pt wants to discuss her hormone levels.

## 2014-10-22 ENCOUNTER — Ambulatory Visit: Payer: Medicaid Other | Admitting: Obstetrics and Gynecology

## 2014-10-22 NOTE — Telephone Encounter (Signed)
Pt advised that Dr. Glo Herring wanted to discuss her medication with her at her next appointment.

## 2014-10-28 ENCOUNTER — Ambulatory Visit: Payer: Medicaid Other | Admitting: Obstetrics and Gynecology

## 2014-11-01 ENCOUNTER — Ambulatory Visit: Payer: Medicaid Other | Admitting: Family Medicine

## 2014-11-12 ENCOUNTER — Ambulatory Visit: Payer: Medicaid Other | Admitting: Obstetrics and Gynecology

## 2014-11-16 ENCOUNTER — Ambulatory Visit: Payer: Medicaid Other | Admitting: Family Medicine

## 2014-11-18 ENCOUNTER — Telehealth: Payer: Self-pay | Admitting: Obstetrics and Gynecology

## 2014-11-19 ENCOUNTER — Ambulatory Visit: Payer: Medicaid Other | Admitting: Obstetrics and Gynecology

## 2014-11-24 ENCOUNTER — Ambulatory Visit: Payer: Medicaid Other | Admitting: Obstetrics and Gynecology

## 2014-11-26 NOTE — Telephone Encounter (Signed)
Multiple attempts to reach pt with no luck.

## 2014-12-02 ENCOUNTER — Ambulatory Visit: Payer: Medicaid Other | Admitting: Family Medicine

## 2014-12-07 ENCOUNTER — Other Ambulatory Visit: Payer: Self-pay | Admitting: Adult Health

## 2014-12-16 ENCOUNTER — Ambulatory Visit (INDEPENDENT_AMBULATORY_CARE_PROVIDER_SITE_OTHER): Payer: Medicaid Other | Admitting: Obstetrics and Gynecology

## 2014-12-16 ENCOUNTER — Encounter: Payer: Self-pay | Admitting: Obstetrics and Gynecology

## 2014-12-16 VITALS — BP 120/80 | Ht 64.0 in | Wt 198.0 lb

## 2014-12-16 DIAGNOSIS — Z780 Asymptomatic menopausal state: Secondary | ICD-10-CM | POA: Diagnosis not present

## 2014-12-16 DIAGNOSIS — F99 Mental disorder, not otherwise specified: Secondary | ICD-10-CM | POA: Diagnosis not present

## 2014-12-16 NOTE — Progress Notes (Signed)
Patient ID: Karen Cox, female   DOB: January 05, 1961, 54 y.o.   MRN: [id number] Pt here today for vaginal itching and odor. Pt states that she sometimes has a vaginal itch and odor. Pt states that she wants blood drawn again for hormone levels.

## 2014-12-16 NOTE — Progress Notes (Addendum)
Patient ID: Karen Cox, female   DOB: 18-Nov-1960, 54 y.o.   MRN: [id number] This chart was SCRIBED for Mallory Shirk, MD by Stephania Fragmin, ED Scribe. This patient was seen in room 3 and the patient's care was started at 11:20 AM.   [facility]  Patient name: Karen Cox MRN [id number]  Date of birth: 12/08/1960  CC & HPI:  Karen Cox is a 55 y.o. female presenting today for hormone therapy.She is difficult to understand due to rambling, erratic thought pattern. She reports she is "deeply unhappy" because she doesn't have a period, but wants one. She states she would like a prescription for hormone therapy to have a period. Patient states that she was told by a PA in [place] that the Prempro she was taking was causing a rash, but she states the rash has persisted after stopping Prempro.  Patient manages her own medication.  ROS:  Pt has several page notebook of statements, but she cannot review them with me as requested.  Pertinent History Reviewed:   Reviewed: Significant for Dilitation & currettage/hystroscopy with thermachoice ablation and Cervical polypectomy Medical         Past Medical History  Diagnosis Date  . Allergy   . Depression   . GERD (gastroesophageal reflux disease)   . Anxiety   . Hyperlipidemia   . Vaginal itching 08/05/2013  . Yeast infection 08/05/2013  . Peri-menopause 08/05/2013  . Hot flashes 09/01/2013  . PMB (postmenopausal bleeding) 11/16/2013  . Mild mental slowing   . Cervical high risk HPV (human papillomavirus) test positive 05/17/2014                              Surgical Hx:    Past Surgical History  Procedure Laterality Date  . No past surgeries    . Colonoscopy  09/19/2012    Procedure: COLONOSCOPY;  Surgeon: Danie Binder, MD;  [facility];  Service: Endoscopy;  Laterality: N/A;  8:30 AM  . Dilitation & currettage/hystroscopy with thermachoice ablation N/A 01/06/2014    Procedure: DILATATION &  CURETTAGE/HYSTEROSCOPY WITH THERMACHOICE ABLATION;  Surgeon: Florian Buff, MD;  Location: [facility];  Service: Gynecology;  Laterality: N/A;  total therapy time:  9 minutes 12 seconds ,     temperature:87 degrees  . Cervical polypectomy N/A 01/06/2014    Procedure: CERVICAL POLYPECTOMY;  Surgeon: Florian Buff, MD;  Location: [facility];  Service: Gynecology;  Laterality: N/A;   Medications: Reviewed & Updated - see associated section                       Current outpatient prescriptions:  .  estrogen, conjugated,-medroxyprogesterone (PREMPRO) 0.625-2.5 MG per tablet, Take 1 tablet by mouth daily., Disp: 30 tablet, Rfl: 3 .  fluconazole (DIFLUCAN) 150 MG tablet, TAKE 1 TABLET BY MOUTH NOW, THEN TAKE 1 TABLET IN 3 DAYS., Disp: 2 tablet, Rfl: 1 .  hydrOXYzine (ATARAX/VISTARIL) 50 MG tablet, Take 100 mg by mouth 2 (two) times daily., Disp: , Rfl:  .  levocetirizine (XYZAL) 5 MG tablet, Take 5 mg by mouth every evening., Disp: , Rfl:  .  loratadine (CLARITIN) 10 MG tablet, Take 20 mg by mouth daily. , Disp: , Rfl:  .  metroNIDAZOLE (METROGEL) 0.75 % vaginal gel, Place 1 Applicatorful vaginally at bedtime. Apply one applicatorful to vagina at bedtime for 5 days, Disp: 70 g, Rfl:  1 .  triamcinolone ointment (KENALOG) 0.1 %, Apply 1 application topically 3 (three) times daily., Disp: , Rfl:    Social History: Reviewed -  reports that she quit smoking about 33 years ago. Her smoking use included Cigarettes. She has a .75 pack-year smoking history. She has never used smokeless tobacco.  Objective Findings:  Vitals: Blood pressure 120/80, height 5\' 4"  (1.626 m), weight 198 lb (89.812 kg), last menstrual period 06/27/2013.  Physical Examination:  Patient here for discussion only.  Assessment & Plan:   A:  1. Mental dysfunction prohibits meaningful management of pt's concerns over failure to have children, or to have periods 2. Postmenopausal female 3. S/P endometrial ablation.  P:  1.  Pt  Informed  that menses cannot be induced as she requests due to ablation. 2. Pt informed that she's postmenopausal 3. Pap smear up to date, last done 8/15 4. Mammogram up to date, done 11/15 5. AFTER FAILING TO REACH ANY LEVEL OF COMMUNICATION WITH THIS PATIENT , I FEEL I CANNOT CONTINUE TO OFFER ANY SERVICE TO THIS PATIENT. PATIENT IS MADE AWARE THAT SHE MAY SEEK ANOTHER DOCTOR, BUT THAT I WILL NOT BE AVAILABLE FOR FUTURE CARE.  RECORDS WILL BE MADE AVAILABLE TO FUTURE PROVIDERS. 6. DR Moshe Cipro TO BE NOTIFIED  I personally performed the services described in this documentation, which was SCRIBED in my presence. The recorded information has been reviewed and considered accurate. It has been edited as necessary during review. Jonnie Kind, MD

## 2014-12-17 ENCOUNTER — Telehealth: Payer: Self-pay | Admitting: Family Medicine

## 2014-12-17 ENCOUNTER — Encounter: Payer: Self-pay | Admitting: Family Medicine

## 2014-12-17 DIAGNOSIS — N951 Menopausal and female climacteric states: Secondary | ICD-10-CM

## 2014-12-17 NOTE — Telephone Encounter (Signed)
referred

## 2015-01-13 ENCOUNTER — Ambulatory Visit: Payer: Medicaid Other | Admitting: Family Medicine

## 2015-01-28 ENCOUNTER — Encounter: Payer: Medicaid Other | Admitting: Obstetrics & Gynecology

## 2015-02-24 ENCOUNTER — Other Ambulatory Visit (HOSPITAL_COMMUNITY): Payer: Self-pay | Admitting: Family

## 2015-02-24 DIAGNOSIS — Z1231 Encounter for screening mammogram for malignant neoplasm of breast: Secondary | ICD-10-CM

## 2015-03-21 ENCOUNTER — Ambulatory Visit (HOSPITAL_COMMUNITY): Payer: Medicaid Other

## 2015-03-24 ENCOUNTER — Ambulatory Visit (HOSPITAL_COMMUNITY): Payer: Medicaid Other

## 2015-04-18 ENCOUNTER — Ambulatory Visit (HOSPITAL_COMMUNITY): Payer: Medicaid Other

## 2015-04-28 ENCOUNTER — Ambulatory Visit (HOSPITAL_COMMUNITY): Payer: Medicaid Other

## 2015-05-06 ENCOUNTER — Ambulatory Visit (HOSPITAL_COMMUNITY): Payer: Medicaid Other

## 2015-05-12 ENCOUNTER — Ambulatory Visit (HOSPITAL_COMMUNITY): Payer: Medicaid Other

## 2015-05-13 ENCOUNTER — Ambulatory Visit (HOSPITAL_COMMUNITY)
Admission: RE | Admit: 2015-05-13 | Discharge: 2015-05-13 | Disposition: A | Discharge status: Home / Self Care | Payer: Medicaid Other | Source: Ambulatory Visit | Attending: Family | Admitting: Family

## 2015-05-13 DIAGNOSIS — Z1231 Encounter for screening mammogram for malignant neoplasm of breast: Secondary | ICD-10-CM | POA: Diagnosis present

## 2015-12-26 ENCOUNTER — Ambulatory Visit: Payer: Medicaid Other | Admitting: Medical

## 2016-01-10 ENCOUNTER — Encounter: Payer: Self-pay | Admitting: *Deleted

## 2016-01-18 ENCOUNTER — Ambulatory Visit: Payer: Medicaid Other | Admitting: Obstetrics & Gynecology

## 2016-01-19 ENCOUNTER — Encounter: Payer: Self-pay | Admitting: Family Medicine

## 2016-01-19 ENCOUNTER — Ambulatory Visit (INDEPENDENT_AMBULATORY_CARE_PROVIDER_SITE_OTHER): Payer: Medicaid Other | Admitting: Family Medicine

## 2016-01-19 VITALS — BP 128/74 | HR 84 | Resp 18 | Ht 65.0 in | Wt 207.0 lb

## 2016-01-19 DIAGNOSIS — M545 Low back pain: Secondary | ICD-10-CM

## 2016-01-19 DIAGNOSIS — N62 Hypertrophy of breast: Secondary | ICD-10-CM

## 2016-01-19 DIAGNOSIS — M5430 Sciatica, unspecified side: Secondary | ICD-10-CM

## 2016-01-19 DIAGNOSIS — E785 Hyperlipidemia, unspecified: Secondary | ICD-10-CM

## 2016-01-19 DIAGNOSIS — M5442 Lumbago with sciatica, left side: Secondary | ICD-10-CM

## 2016-01-19 DIAGNOSIS — F99 Mental disorder, not otherwise specified: Secondary | ICD-10-CM | POA: Diagnosis not present

## 2016-01-19 DIAGNOSIS — M5441 Lumbago with sciatica, right side: Secondary | ICD-10-CM

## 2016-01-19 DIAGNOSIS — M5440 Lumbago with sciatica, unspecified side: Secondary | ICD-10-CM

## 2016-01-19 DIAGNOSIS — E669 Obesity, unspecified: Secondary | ICD-10-CM

## 2016-01-19 DIAGNOSIS — J302 Other seasonal allergic rhinitis: Secondary | ICD-10-CM

## 2016-01-19 NOTE — Assessment & Plan Note (Signed)
Deteriorated. Patient re-educated about  the importance of commitment to a  minimum of 150 minutes of exercise per week.  The importance of healthy food choices with portion control discussed. Encouraged to start a food diary, count calories and to consider  joining a support group. Sample diet sheets offered. Goals set by the patient for the next several months.   Weight /BMI 01/19/2016 12/16/2014 09/13/2014  WEIGHT 207 lb 198 lb 191 lb 8 oz  HEIGHT 5\' 5"  5\' 4"  5\' 5"   BMI 34.45 kg/m2 33.97 kg/m2 31.87 kg/m2    Current exercise per week 30 minutes.

## 2016-01-19 NOTE — Progress Notes (Signed)
Subjective:    Patient ID: Karen Cox, female    DOB: Oct 17, 1960, 55 y.o.   MRN: [id number]  HPI   Karen Cox     MRN: [id number]      DOB: [birth year]   HPI Karen Cox is here for follow up and re-evaluation of chronic medical conditions, medication management and review of any available recent lab and radiology data. Last seen over 12 months ago, had records moved to [facility], unclear reason, and is now back for regular medical care Preventive health is updated, specifically  Cancer screening and Immunization.  Most recent labs have been in past 6 months, so request made to have them sent She remains on HRT, and will need to establish with gyne to continue this, she has been in the past and remains anxious and focused on menopause, hormones and changes The PT denies any adverse reactions to current medications since the last visit.  Intends to re commit to weight loss effort, adn improved health still hopes to get breast reduction surgery and now states she will be trying to find a female Doc, she has been working with female Doc in the past who advised her that she would need to weigh 170 pounds before she would be able to have the surgery  ROS Denies recent fever or chills. Denies sinus pressure, nasal congestion, ear pain or sore throat. Denies chest congestion, productive cough or wheezing. Denies chest pains, palpitations and leg swelling Denies abdominal pain, nausea, vomiting,diarrhea or constipation.   Denies dysuria, frequency, hesitancy or incontinence. Denies joint pain, swelling and limitation in mobility. Denies headaches, seizures, numbness, or tingling. Denies depression, uncontrolled  anxiety or insomnia. Denies skin break down or rash.   PE  BP 128/74 mmHg  Pulse 84  Resp 18  Ht 5\' 5"  (1.651 m)  Wt 207 lb (93.895 kg)  BMI 34.45 kg/m2  SpO2 99%  LMP 06/27/2013  Patient alert and oriented and in no cardiopulmonary distress.  HEENT: No  facial asymmetry, EOMI,   oropharynx pink and moist.  Neck supple no JVD, no mass.  Chest: Clear to auscultation bilaterally.  CVS: S1, S2 no murmurs, no S3.Regular rate.  ABD: Soft non tender.   Ext: No edema  MS: Adequate ROM spine, shoulders, hips and knees.  Skin: Intact, no ulcerations or rash noted.  Psych: Good eye contact, normal affect. Memory intact not anxious or depressed appearing.  CNS: CN 2-12 intact, power,  normal throughout.no focal deficits noted.   Assessment & Plan   OBESITY Deteriorated. Patient re-educated about  the importance of commitment to a  minimum of 150 minutes of exercise per week.  The importance of healthy food choices with portion control discussed. Encouraged to start a food diary, count calories and to consider  joining a support group. Sample diet sheets offered. Goals set by the patient for the next several months.   Weight /BMI 01/19/2016 12/16/2014 09/13/2014  WEIGHT 207 lb 198 lb 191 lb 8 oz  HEIGHT 5\' 5"  5\' 4"  5\' 5"   BMI 34.45 kg/m2 33.97 kg/m2 31.87 kg/m2    Current exercise per week 30 minutes.    Back pain of lumbar region with sciatica Back pain radiating to thighs x 1 years  Dyslipidemia Hyperlipidemia:Low fat diet discussed and encouraged.   Lipid Panel  Lab Results  Component Value Date   CHOL 192 08/18/2013   HDL 45 08/18/2013   LDLCALC 132* 08/18/2013   TRIG 73 08/18/2013   CHOLHDL  4.3 08/18/2013   Updated lab needed at/ before next visit.      Mental disorder Stable, followed by mental health.  Gynecomastia Still planning to get surgery, working on weight loss, as this is necessary to qualify  Seasonal allergies No current flare, on hydroxyzine for anxiety   Back pain with sciatica X ray of lumbar spine        Review of Systems     Objective:   Physical Exam        Assessment & Plan:

## 2016-01-19 NOTE — Assessment & Plan Note (Signed)
Back pain radiating to thighs x 1 years

## 2016-01-19 NOTE — Patient Instructions (Signed)
F/u in 5 month, call if you need me before  Pls get x ray of low back, arhritis in the back is causing leg pain   Please work on good  health habits so that your health will improve. 1. Commitment to daily physical activity for 30 to 60  minutes, if you are able to do this.  2. Commitment to wise food choices. Aim for half of your  food intake to be vegetable and fruit, one quarter starchy foods, and one quarter protein. Try to eat on a regular schedule  3 meals per day, snacking between meals should be limited to vegetables or fruits or small portions of nuts. 64 ounces of water per day is generally recommended, unless you have specific health conditions, like heart failure or kidney failure where you will need to limit fluid intake.  3. Commitment to sufficient and a  good quality of physical and mental rest daily, generally between 6 to 8 hours per day.  WITH PERSISTANCE AND PERSEVERANCE, THE IMPOSSIBLE , BECOMES THE NORM!  QWeight loss goal is 6  To 8 pounds  Thank you  for choosing  Primary Care. We consider it a privelige to serve you.  Delivering excellent health care in a caring and  compassionate way is our goal.  Partnering with you,  so that together we can achieve this goal is our strategy.

## 2016-01-22 DIAGNOSIS — M5430 Sciatica, unspecified side: Secondary | ICD-10-CM | POA: Insufficient documentation

## 2016-01-22 DIAGNOSIS — M549 Dorsalgia, unspecified: Secondary | ICD-10-CM

## 2016-01-22 NOTE — Assessment & Plan Note (Signed)
X ray of lumbar spine

## 2016-01-22 NOTE — Assessment & Plan Note (Signed)
Still planning to get surgery, working on weight loss, as this is necessary to qualify

## 2016-01-22 NOTE — Assessment & Plan Note (Signed)
Stable, followed by mental health.

## 2016-01-22 NOTE — Assessment & Plan Note (Signed)
No current flare, on hydroxyzine for anxiety

## 2016-01-22 NOTE — Assessment & Plan Note (Signed)
Hyperlipidemia:Low fat diet discussed and encouraged.   Lipid Panel  Lab Results  Component Value Date   CHOL 192 08/18/2013   HDL 45 08/18/2013   LDLCALC 132* 08/18/2013   TRIG 73 08/18/2013   CHOLHDL 4.3 08/18/2013   Updated lab needed at/ before next visit.

## 2016-02-01 ENCOUNTER — Ambulatory Visit (INDEPENDENT_AMBULATORY_CARE_PROVIDER_SITE_OTHER): Payer: Medicaid Other | Admitting: Obstetrics & Gynecology

## 2016-02-01 ENCOUNTER — Encounter: Payer: Self-pay | Admitting: Obstetrics & Gynecology

## 2016-02-01 VITALS — BP 139/98 | HR 111 | Wt 208.6 lb

## 2016-02-01 DIAGNOSIS — N912 Amenorrhea, unspecified: Secondary | ICD-10-CM

## 2016-02-01 NOTE — Progress Notes (Signed)
   Subjective:    Patient ID: Karen Cox, female    DOB: [birth year], 55 y.o.   MRN: [id number]  HPI  55 yo lady here for a "consultation". She has a letter from an attorney who declines to take her med mal case. In this letter it suggests that she see a gynecologist to see if the procedure done by Dr. Elonda Husky about 2 years ago was negligent. Her main complaint seems to be that she didn't understand that she would be getting an endometrial ablation.  Review of Systems     Objective:   Physical Exam [id number] She is speaking erratically, not making sense all the time, very difficult to follow her train of thought She has no gyn complaints today and declines an exam.      Assessment & Plan:  At the end of her consultation, I tried to explain to her that I have no criticism of Dr. Brynda Greathouse care. She seemed unwilling to accept my opinion.

## 2016-03-06 ENCOUNTER — Ambulatory Visit (HOSPITAL_COMMUNITY)
Admission: RE | Admit: 2016-03-06 | Discharge: 2016-03-06 | Disposition: A | Discharge status: Home / Self Care | Payer: Medicaid Other | Source: Ambulatory Visit | Attending: Family Medicine | Admitting: Family Medicine

## 2016-03-06 DIAGNOSIS — M5442 Lumbago with sciatica, left side: Secondary | ICD-10-CM | POA: Diagnosis not present

## 2016-03-06 DIAGNOSIS — M4186 Other forms of scoliosis, lumbar region: Secondary | ICD-10-CM | POA: Insufficient documentation

## 2016-03-06 DIAGNOSIS — M4316 Spondylolisthesis, lumbar region: Secondary | ICD-10-CM | POA: Insufficient documentation

## 2016-03-06 DIAGNOSIS — M5440 Lumbago with sciatica, unspecified side: Secondary | ICD-10-CM

## 2016-03-06 DIAGNOSIS — M5441 Lumbago with sciatica, right side: Secondary | ICD-10-CM | POA: Diagnosis not present

## 2016-03-06 DIAGNOSIS — M8588 Other specified disorders of bone density and structure, other site: Secondary | ICD-10-CM | POA: Insufficient documentation

## 2016-03-06 DIAGNOSIS — M545 Low back pain: Secondary | ICD-10-CM | POA: Diagnosis present

## 2016-03-15 ENCOUNTER — Telehealth: Payer: Self-pay | Admitting: Family Medicine

## 2016-03-15 DIAGNOSIS — M5442 Lumbago with sciatica, left side: Principal | ICD-10-CM

## 2016-03-15 DIAGNOSIS — M5441 Lumbago with sciatica, right side: Secondary | ICD-10-CM

## 2016-03-15 MED ORDER — NAPROXEN 500 MG PO TABS: 500.0000 mg | ORAL_TABLET | Freq: Two times a day (BID) | ORAL | Status: DC | PRN

## 2016-03-15 NOTE — Telephone Encounter (Signed)
Karen Cox stated that if she dont answer its ok to leave results with her mother

## 2016-03-15 NOTE — Telephone Encounter (Signed)
Results of back xray given to patient's mother.  Medication sent to pharmacy.  Mother will have patient to call back to schedule an appointment to follow up here and left staff know whether or not she would like to go to physical therapy for back.

## 2016-03-15 NOTE — Telephone Encounter (Signed)
Patient made appointment for follow up and referral placed for PT.

## 2016-04-11 ENCOUNTER — Telehealth: Payer: Self-pay | Admitting: Family Medicine

## 2016-04-11 NOTE — Telephone Encounter (Signed)
Patient aware that Naproxen was a short term medication and she will try Tylenol instead.

## 2016-04-11 NOTE — Telephone Encounter (Signed)
Karen Cox is needing a refill on her Pain/Arthritis medication she was on, Rx did not refills on it

## 2016-04-16 ENCOUNTER — Other Ambulatory Visit: Payer: Self-pay | Admitting: Family Medicine

## 2016-04-16 DIAGNOSIS — Z1231 Encounter for screening mammogram for malignant neoplasm of breast: Secondary | ICD-10-CM

## 2016-05-14 ENCOUNTER — Ambulatory Visit (HOSPITAL_COMMUNITY): Payer: Medicaid Other

## 2016-05-25 ENCOUNTER — Ambulatory Visit (HOSPITAL_COMMUNITY)
Admission: RE | Admit: 2016-05-25 | Discharge: 2016-05-25 | Disposition: A | Discharge status: Home / Self Care | Payer: Medicaid Other | Source: Ambulatory Visit | Attending: Family Medicine | Admitting: Family Medicine

## 2016-05-25 DIAGNOSIS — Z1231 Encounter for screening mammogram for malignant neoplasm of breast: Secondary | ICD-10-CM | POA: Diagnosis present

## 2016-06-25 ENCOUNTER — Telehealth: Payer: Self-pay | Admitting: Family Medicine

## 2016-06-25 NOTE — Telephone Encounter (Signed)
LM to reschedule, Dr. Moshe Cipro out of town

## 2016-07-02 ENCOUNTER — Telehealth: Payer: Self-pay | Admitting: Family Medicine

## 2016-07-02 DIAGNOSIS — Z114 Encounter for screening for human immunodeficiency virus [HIV]: Secondary | ICD-10-CM

## 2016-07-02 DIAGNOSIS — Z1159 Encounter for screening for other viral diseases: Secondary | ICD-10-CM

## 2016-07-02 DIAGNOSIS — E669 Obesity, unspecified: Secondary | ICD-10-CM

## 2016-07-02 DIAGNOSIS — E785 Hyperlipidemia, unspecified: Secondary | ICD-10-CM

## 2016-07-02 DIAGNOSIS — Z79890 Hormone replacement therapy (postmenopausal): Secondary | ICD-10-CM

## 2016-07-02 NOTE — Telephone Encounter (Signed)
Has questions about taking pain medication, she has heard that long term could cause problems

## 2016-07-03 ENCOUNTER — Ambulatory Visit: Payer: Medicaid Other | Admitting: Family Medicine

## 2016-07-03 NOTE — Telephone Encounter (Signed)
Patient with concerns about warnings with Nsaids and Naproxen.  Concerns addressed.  Labs for upcoming appointment ordered and mailed to patient

## 2016-07-03 NOTE — Telephone Encounter (Signed)
Spoke with patient and aware her concerns will be addressed at visit

## 2016-07-12 ENCOUNTER — Ambulatory Visit: Payer: Medicaid Other | Admitting: Family Medicine

## 2016-07-20 ENCOUNTER — Other Ambulatory Visit: Payer: Self-pay

## 2016-07-20 MED ORDER — NAPROXEN 500 MG PO TABS: 500.0000 mg | ORAL_TABLET | Freq: Two times a day (BID) | ORAL | 0 refills | Status: DC | PRN

## 2016-07-23 ENCOUNTER — Ambulatory Visit: Payer: Medicaid Other | Admitting: Family Medicine

## 2016-08-02 ENCOUNTER — Ambulatory Visit: Payer: Medicaid Other | Admitting: Family Medicine

## 2016-08-10 ENCOUNTER — Encounter: Payer: Self-pay | Admitting: Obstetrics & Gynecology

## 2016-08-17 ENCOUNTER — Encounter: Payer: Self-pay | Admitting: Family Medicine

## 2016-08-17 ENCOUNTER — Ambulatory Visit (INDEPENDENT_AMBULATORY_CARE_PROVIDER_SITE_OTHER): Payer: Medicaid Other | Admitting: Family Medicine

## 2016-08-17 VITALS — BP 120/82 | HR 86 | Resp 16 | Ht 65.0 in | Wt 224.0 lb

## 2016-08-17 DIAGNOSIS — M5430 Sciatica, unspecified side: Secondary | ICD-10-CM

## 2016-08-17 DIAGNOSIS — F418 Other specified anxiety disorders: Secondary | ICD-10-CM

## 2016-08-17 DIAGNOSIS — F329 Major depressive disorder, single episode, unspecified: Secondary | ICD-10-CM

## 2016-08-17 DIAGNOSIS — F419 Anxiety disorder, unspecified: Secondary | ICD-10-CM

## 2016-08-17 DIAGNOSIS — M549 Dorsalgia, unspecified: Secondary | ICD-10-CM

## 2016-08-17 DIAGNOSIS — Z23 Encounter for immunization: Secondary | ICD-10-CM | POA: Diagnosis not present

## 2016-08-17 DIAGNOSIS — E6609 Other obesity due to excess calories: Secondary | ICD-10-CM | POA: Diagnosis not present

## 2016-08-17 DIAGNOSIS — E785 Hyperlipidemia, unspecified: Secondary | ICD-10-CM

## 2016-08-17 DIAGNOSIS — Z6834 Body mass index (BMI) 34.0-34.9, adult: Secondary | ICD-10-CM

## 2016-08-17 DIAGNOSIS — Z79890 Hormone replacement therapy (postmenopausal): Secondary | ICD-10-CM

## 2016-08-17 LAB — CBC
HCT: 38.9 % (ref 35.0–45.0)
Hemoglobin: 12.5 g/dL (ref 11.7–15.5)
MCH: 27.2 pg (ref 27.0–33.0)
MCHC: 32.1 g/dL (ref 32.0–36.0)
MCV: 84.7 fL (ref 80.0–100.0)
MPV: 9.8 fL (ref 7.5–12.5)
PLATELETS: 267 10*3/uL (ref 140–400)
RBC: 4.59 MIL/uL (ref 3.80–5.10)
RDW: 14.5 % (ref 11.0–15.0)
WBC: 5.5 10*3/uL (ref 3.8–10.8)

## 2016-08-17 MED ORDER — MELOXICAM 7.5 MG PO TABS: 7.5000 mg | ORAL_TABLET | Freq: Every day | ORAL | 3 refills | Status: DC

## 2016-08-17 MED ORDER — ACETAMINOPHEN 500 MG PO TABS: ORAL_TABLET | ORAL | 3 refills | Status: DC

## 2016-08-17 MED ORDER — CYCLOBENZAPRINE HCL 10 MG PO TABS: 10.0000 mg | ORAL_TABLET | Freq: Every day | ORAL | 3 refills | Status: DC

## 2016-08-17 NOTE — Patient Instructions (Addendum)
F/U IN 3 MONTHS, CALL IF YOU NEED ME BEFORE  FLU VACCINE TODAY lABS TODAY ALREADY ORDERED  Medication has been sent in for your back pain  Please work on good  health habits so that your health will improve. 1. Commitment to daily physical activity for 30 to 60  minutes, if you are able to do this.  2. Commitment to wise food choices. Aim for half of your  food intake to be vegetable and fruit, one quarter starchy foods, and one quarter protein. Try to eat on a regular schedule  3 meals per day, snacking between meals should be limited to vegetables or fruits or small portions of nuts. 64 ounces of water per day is generally recommended, unless you have specific health conditions, like heart failure or kidney failure where you will need to limit fluid intake.  3. Commitment to sufficient and a  good quality of physical and mental rest daily, generally between 6 to 8 hours per day.  WITH PERSISTANCE AND PERSEVERANCE, THE IMPOSSIBLE , BECOMES THE NORM! Thank you  for choosing [facility]. We consider it a privelige to serve you.  Delivering excellent health care in a caring and  compassionate way is our goal.  Partnering with you,  so that together we can achieve this goal is our strategy.

## 2016-08-18 ENCOUNTER — Encounter: Payer: Self-pay | Admitting: Family Medicine

## 2016-08-18 LAB — LIPID PANEL
Cholesterol: 204 mg/dL — ABNORMAL HIGH (ref <200)
HDL: 60 mg/dL (ref >50)
LDL Cholesterol: 124 mg/dL — ABNORMAL HIGH (ref <100)
Total CHOL/HDL Ratio: 3.4 Ratio (ref <5.0)
Triglycerides: 99 mg/dL (ref <150)
VLDL: 20 mg/dL (ref <30)

## 2016-08-18 LAB — HIV ANTIBODY (ROUTINE TESTING W REFLEX): HIV 1&2 Ab, 4th Generation: NONREACTIVE

## 2016-08-18 LAB — BASIC METABOLIC PANEL
BUN: 18 mg/dL (ref 7–25)
CHLORIDE: 105 mmol/L (ref 98–110)
CO2: 23 mmol/L (ref 20–31)
CREATININE: 0.8 mg/dL (ref 0.50–1.05)
Calcium: 9.7 mg/dL (ref 8.6–10.4)
Glucose, Bld: 77 mg/dL (ref 65–99)
Potassium: 4.7 mmol/L (ref 3.5–5.3)
Sodium: 138 mmol/L (ref 135–146)

## 2016-08-18 LAB — HEPATITIS C ANTIBODY: HCV AB: NEGATIVE

## 2016-08-18 LAB — TSH: TSH: 1.55 mIU/L

## 2016-08-19 ENCOUNTER — Encounter: Payer: Self-pay | Admitting: Family Medicine

## 2016-08-19 DIAGNOSIS — Z23 Encounter for immunization: Secondary | ICD-10-CM | POA: Insufficient documentation

## 2016-08-19 NOTE — Assessment & Plan Note (Signed)
Managed by gyne at [facility]

## 2016-08-19 NOTE — Assessment & Plan Note (Signed)
One month h/o increased and uncontrolled pain, start meloxicam, tylenol and bed time muscle relaxant.  Pt will start exercise at [facility], declined phyisical therapy

## 2016-08-19 NOTE — Assessment & Plan Note (Signed)
Hyperlipidemia:Low fat diet discussed and encouraged.   Lipid Panel  Lab Results  Component Value Date   CHOL 204 (H) 08/17/2016   HDL 60 08/17/2016   LDLCALC 124 (H) 08/17/2016   TRIG 99 08/17/2016   CHOLHDL 3.4 08/17/2016     Updated lab needed at/ before next visit.  

## 2016-08-19 NOTE — Assessment & Plan Note (Signed)
After obtaining informed consent, the vaccine is  administered by LPN.  

## 2016-08-19 NOTE — Progress Notes (Signed)
   Karen Cox     MRN: [id number]      DOB: [birth year]   HPI Ms. Locy is here for follow up and re-evaluation of chronic medical conditions, medication management and review of any available recent lab and radiology data.  Preventive health is updated, specifically  Cancer screening and Immunization.   Questions or concerns regarding consultations or procedures which the PT has had in the interim are  Addressed.Being followed by gyne at [facility] The PT denies any adverse reactions to current medications since the last visit.  1 month h/o increased back pain radiating to lower extremities. Has tried multiple meds, fixated on ibuprofen despite stated aspirin allergy and prior use of alleve. Denies lower extremity weakness or numbness, denies loss of sensation or incontinence Has gained a lot of weight and intends to resume low calorie eating so that her health will improve ROS Denies recent fever or chills. Denies sinus pressure, nasal congestion, ear pain or sore throat. Denies chest congestion, productive cough or wheezing. Denies chest pains, palpitations and leg swelling Denies abdominal pain, nausea, vomiting,diarrhea or constipation.   Denies dysuria, frequency, hesitancy or incontinence.  Denies headaches, seizures, numbness, or tingling. Denies depression, uncontrolled  anxiety or insomnia. Denies skin break down or rash.   PE  BP 120/82   Pulse 86   Resp 16   Ht 5\' 5"  (1.651 m)   Wt 224 lb (101.6 kg)   LMP 06/27/2013   SpO2 99%   BMI 37.28 kg/m   Patient alert and oriented and in no cardiopulmonary distress.  HEENT: No facial asymmetry, EOMI,   oropharynx pink and moist.  Neck supple no JVD, no mass.  Chest: Clear to auscultation bilaterally.  CVS: S1, S2 no murmurs, no S3.Regular rate.  ABD: Soft non tender.   Ext: No edema  MS: Adequate though reduced  ROM spine, shoulders, hips and knees.  Skin: Intact, no ulcerations or rash noted.  Psych: Good  eye contact, normal affect. Memory intact not anxious or depressed appearing.  CNS: CN 2-12 intact, power,  normal throughout.no focal deficits noted.   Assessment & Plan  Back pain with sciatica One month h/o increased and uncontrolled pain, start meloxicam, tylenol and bed time muscle relaxant.  Pt will start exercise at [facility], declined phyisical therapy  Dyslipidemia Hyperlipidemia:Low fat diet discussed and encouraged.   Lipid Panel  Lab Results  Component Value Date   CHOL 204 (H) 08/17/2016   HDL 60 08/17/2016   LDLCALC 124 (H) 08/17/2016   TRIG 99 08/17/2016   CHOLHDL 3.4 08/17/2016     Updated lab needed at/ before next visit.   Obesity Deteriorated. Patient re-educated about  the importance of commitment to a  minimum of 150 minutes of exercise per week.  The importance of healthy food choices with portion control discussed. Encouraged to start a food diary, count calories and to consider  joining a support group. Sample diet sheets offered. Goals set by the patient for the next several months.   Weight /BMI 08/17/2016 02/01/2016 01/19/2016  WEIGHT 224 lb 208 lb 9.6 oz 207 lb  HEIGHT 5\' 5"  - 5\' 5"   BMI 37.28 kg/m2 34.71 kg/m2 34.45 kg/m2      Hormone replacement therapy (HRT) Managed by gyne at [place] and depression Stable, followed by psych  Need for prophylactic vaccination and inoculation against influenza After obtaining informed consent, the vaccine is  administered by LPN.

## 2016-08-19 NOTE — Assessment & Plan Note (Signed)
Stable, followed by psych

## 2016-08-19 NOTE — Assessment & Plan Note (Signed)
Deteriorated. Patient re-educated about  the importance of commitment to a  minimum of 150 minutes of exercise per week.  The importance of healthy food choices with portion control discussed. Encouraged to start a food diary, count calories and to consider  joining a support group. Sample diet sheets offered. Goals set by the patient for the next several months.   Weight /BMI 08/17/2016 02/01/2016 01/19/2016  WEIGHT 224 lb 208 lb 9.6 oz 207 lb  HEIGHT 5\' 5"  - 5\' 5"   BMI 37.28 kg/m2 34.71 kg/m2 34.45 kg/m2

## 2016-09-28 ENCOUNTER — Other Ambulatory Visit: Payer: Self-pay | Admitting: Family Medicine

## 2016-10-02 ENCOUNTER — Other Ambulatory Visit: Payer: Self-pay | Admitting: Family Medicine

## 2016-11-09 ENCOUNTER — Telehealth: Payer: Self-pay | Admitting: Family Medicine

## 2016-11-09 NOTE — Telephone Encounter (Signed)
Karen Cox is asking if Dr. Moshe Cipro would send her in more refills naproxen (NAPROSYN) 500 MG tablet  She states that its really helping also asking for cyclobenzaprine (FLEXERIL) 10 MG tablet sent to [facility], please advise?

## 2016-11-12 ENCOUNTER — Other Ambulatory Visit: Payer: Self-pay

## 2016-11-12 MED ORDER — NAPROXEN 500 MG PO TABS: ORAL_TABLET | ORAL | 0 refills | Status: DC

## 2016-11-12 MED ORDER — CYCLOBENZAPRINE HCL 10 MG PO TABS: 10.0000 mg | ORAL_TABLET | Freq: Every day | ORAL | 1 refills | Status: DC

## 2016-11-12 NOTE — Telephone Encounter (Signed)
meds refilled 

## 2016-11-14 ENCOUNTER — Ambulatory Visit: Payer: Medicaid Other | Admitting: Family Medicine

## 2016-12-13 ENCOUNTER — Ambulatory Visit: Payer: Medicaid Other | Admitting: Family Medicine

## 2017-01-15 DIAGNOSIS — Z029 Encounter for administrative examinations, unspecified: Secondary | ICD-10-CM

## 2017-01-30 ENCOUNTER — Telehealth: Payer: Self-pay | Admitting: Family Medicine

## 2017-01-30 NOTE — Telephone Encounter (Signed)
Patient wants to know if she cancels her appt on 4/30, will she have to go to another doctor since it will be her 3rd time cancelling ?  [phone]

## 2017-01-30 NOTE — Telephone Encounter (Signed)
Patient aware ok to come in may

## 2017-02-04 ENCOUNTER — Ambulatory Visit: Payer: Medicaid Other | Admitting: Family Medicine

## 2017-03-07 ENCOUNTER — Ambulatory Visit: Payer: Medicaid Other | Admitting: Family Medicine

## 2017-03-11 ENCOUNTER — Ambulatory Visit (INDEPENDENT_AMBULATORY_CARE_PROVIDER_SITE_OTHER): Payer: Medicaid Other | Admitting: Family Medicine

## 2017-03-11 ENCOUNTER — Encounter: Payer: Self-pay | Admitting: Family Medicine

## 2017-03-11 VITALS — BP 120/88 | Resp 16 | Ht 65.0 in | Wt 238.0 lb

## 2017-03-11 DIAGNOSIS — E785 Hyperlipidemia, unspecified: Secondary | ICD-10-CM

## 2017-03-11 DIAGNOSIS — E6609 Other obesity due to excess calories: Secondary | ICD-10-CM

## 2017-03-11 DIAGNOSIS — F419 Anxiety disorder, unspecified: Secondary | ICD-10-CM

## 2017-03-11 DIAGNOSIS — Z6839 Body mass index (BMI) 39.0-39.9, adult: Secondary | ICD-10-CM

## 2017-03-11 DIAGNOSIS — J3089 Other allergic rhinitis: Secondary | ICD-10-CM | POA: Diagnosis not present

## 2017-03-11 DIAGNOSIS — E559 Vitamin D deficiency, unspecified: Secondary | ICD-10-CM | POA: Diagnosis not present

## 2017-03-11 DIAGNOSIS — F329 Major depressive disorder, single episode, unspecified: Secondary | ICD-10-CM

## 2017-03-11 NOTE — Patient Instructions (Addendum)
F/u end November, call if you need me before  Weight loss goal of 12 to 15 pounds  ,It is important that you exercise regularly at least 30 minutes 5 times a week. If you develop chest pain, have severe difficulty breathing, or feel very tired, stop exercising immediately and seek medical attention   Fasting labs on the same day of your next visit   Please work on good  health habits so that your health will improve. 1. Commitment to daily physical activity for 30 to 60  minutes, if you are able to do this.  2. Commitment to wise food choices. Aim for half of your  food intake to be vegetable and fruit, one quarter starchy foods, and one quarter protein. Try to eat on a regular schedule  3 meals per day, snacking between meals should be limited to vegetables or fruits or small portions of nuts. 64 ounces of water per day is generally recommended, unless you have specific health conditions, like heart failure or kidney failure where you will need to limit fluid intake.  3. Commitment to sufficient and a  good quality of physical and mental rest daily, generally between 6 to 8 hours per day.  WITH PERSISTANCE AND PERSEVERANCE, THE IMPOSSIBLE , BECOMES THE NORM!

## 2017-03-12 NOTE — Assessment & Plan Note (Signed)
Hyperlipidemia:Low fat diet discussed and encouraged.   Lipid Panel  Lab Results  Component Value Date   CHOL 204 (H) 08/17/2016   HDL 60 08/17/2016   LDLCALC 124 (H) 08/17/2016   TRIG 99 08/17/2016   CHOLHDL 3.4 08/17/2016     Updated lab needed at/ before next visit.

## 2017-03-12 NOTE — Assessment & Plan Note (Signed)
Deteriorated. Patient re-educated about  the importance of commitment to a  minimum of 150 minutes of exercise per week.  The importance of healthy food choices with portion control discussed. Encouraged to start a food diary, count calories and to consider  joining a support group. Sample diet sheets offered. Goals set by the patient for the next several months.   Weight /BMI 03/11/2017 08/17/2016 02/01/2016  WEIGHT 238 lb 224 lb 208 lb 9.6 oz  HEIGHT 5\' 5"  5\' 5"  -  BMI 39.61 kg/m2 37.28 kg/m2 34.71 kg/m2

## 2017-03-12 NOTE — Progress Notes (Signed)
   Karen Cox     MRN: [id number]      DOB: 1961-05-24   HPI Karen Cox is here for follow up and re-evaluation of chronic medical conditions, medication management and review of any available recent lab and radiology data.  Preventive health is updated, specifically  Cancer screening and Immunization.   Gyne care is through tertiary system, states she recently had pap, has script for patch for hot flashes , unsure whether she should use it, states the Doc told her that she personally would not and I also agree , so Karen Cox states she does not think that she will use it She has gained a significant amount of weight since her last visit and is motivated to reversing this trend  ROS Denies recent fever or chills. Denies sinus pressure, nasal congestion, ear pain or sore throat. Denies chest congestion, productive cough or wheezing. Denies chest pains, palpitations and leg swelling Denies abdominal pain, nausea, vomiting,diarrhea or constipation.   Denies dysuria, frequency, hesitancy or incontinence. Denies joint pain, swelling and limitation in mobility. Denies headaches, seizures, numbness, or tingling. Denies depression, anxiety or insomnia. Denies skin break down or rash.   PE  BP 120/88   Resp 16   Ht 5\' 5"  (1.651 m)   Wt 238 lb (108 kg)   LMP 06/27/2013   SpO2 98%   BMI 39.61 kg/m   Patient alert and oriented and in no cardiopulmonary distress.  HEENT: No facial asymmetry, EOMI,   oropharynx pink and moist.  Neck supple no JVD, no mass.  Chest: Clear to auscultation bilaterally.  CVS: S1, S2 no murmurs, no S3.Regular rate.  ABD: Soft non tender.   Ext: No edema  MS: Adequate ROM spine, shoulders, hips and knees.  Skin: Intact, no ulcerations or rash noted.  Psych: Good eye contact, normal affect. Memory intact anxious or depressed appearing.  CNS: CN 2-12 intact, power,  normal throughout.no focal deficits noted.   Assessment &  Plan  HYPERLIPIDEMIA Hyperlipidemia:Low fat diet discussed and encouraged.   Lipid Panel  Lab Results  Component Value Date   CHOL 204 (H) 08/17/2016   HDL 60 08/17/2016   LDLCALC 124 (H) 08/17/2016   TRIG 99 08/17/2016   CHOLHDL 3.4 08/17/2016     Updated lab needed at/ before next visit.   Obesity Deteriorated. Patient re-educated about  the importance of commitment to a  minimum of 150 minutes of exercise per week.  The importance of healthy food choices with portion control discussed. Encouraged to start a food diary, count calories and to consider  joining a support group. Sample diet sheets offered. Goals set by the patient for the next several months.   Weight /BMI 03/11/2017 08/17/2016 02/01/2016  WEIGHT 238 lb 224 lb 208 lb 9.6 oz  HEIGHT 5\' 5"  5\' 5"  -  BMI 39.61 kg/m2 37.28 kg/m2 34.71 kg/m2      Seasonal allergies No current flare, controlled with hydroxyzine which she uses for mental health reasons  Anxiety and depression Treated by psychiatry and maintained on hydroxyzine only, pt noted to be anxious at visit

## 2017-03-12 NOTE — Assessment & Plan Note (Signed)
No current flare, controlled with hydroxyzine which she uses for mental health reasons

## 2017-03-12 NOTE — Assessment & Plan Note (Signed)
Treated by psychiatry and maintained on hydroxyzine only, pt noted to be anxious at visit

## 2017-04-08 ENCOUNTER — Telehealth: Payer: Self-pay | Admitting: *Deleted

## 2017-04-08 ENCOUNTER — Other Ambulatory Visit: Payer: Self-pay | Admitting: Family Medicine

## 2017-04-08 ENCOUNTER — Telehealth: Payer: Self-pay | Admitting: Family Medicine

## 2017-04-08 DIAGNOSIS — Z889 Allergy status to unspecified drugs, medicaments and biological substances status: Secondary | ICD-10-CM

## 2017-04-08 MED ORDER — BENZONATATE 100 MG PO CAPS: 100.0000 mg | ORAL_CAPSULE | Freq: Two times a day (BID) | ORAL | 0 refills | Status: DC | PRN

## 2017-04-08 NOTE — Telephone Encounter (Signed)
Tessalon perle sent to [facility] pls let her know

## 2017-04-08 NOTE — Telephone Encounter (Signed)
Has different pcp listed?

## 2017-04-08 NOTE — Telephone Encounter (Signed)
pls let her know she has been referred to the allergy clinic in [place]

## 2017-04-08 NOTE — Telephone Encounter (Signed)
Patient spoke with Jackelyn Poling and patient is requesting a referral to [facility].

## 2017-04-08 NOTE — Telephone Encounter (Signed)
°  REQUESTING REFERRAL (THEY ACCEPT MEDICAID)  [facility]  [address] [place], [place]  [address]  PHONE: [phone] [id number] [place]: [phone]

## 2017-04-08 NOTE — Telephone Encounter (Signed)
Patient called stating she would like something for a cough, patient states she has been having a cough for about a week. Patient would like something called in or a recommendation over the counter. Patient has tried cough drops, and ice Pops  it feels like something is in the center of her throat and wont come up. Patient said it starts in the evening. I offered patient a appointment for tomorrow patient requested for something to be called in. Please advise

## 2017-04-08 NOTE — Telephone Encounter (Signed)
That provider is a OBGYN in [facility] unsure why it is PCP. I added Dr Moshe Cipro to PCP

## 2017-04-08 NOTE — Telephone Encounter (Signed)
Do you want to rx w/o seeing her?

## 2017-04-08 NOTE — Telephone Encounter (Signed)
CALLED [facility] AWARE.

## 2017-04-08 NOTE — Telephone Encounter (Signed)
Is this something you would like to rx?

## 2017-04-18 ENCOUNTER — Telehealth: Payer: Self-pay | Admitting: Family Medicine

## 2017-04-18 NOTE — Telephone Encounter (Signed)
Patient requesting Rx benzonatate (TESSALON) 100 MG capsule   (2nd request)   And is asking if there is anything available in liquid form for the cough.    Please advise  ASAP   Pt uses [facility]

## 2017-04-18 NOTE — Telephone Encounter (Signed)
New Message  Pt voiced on nurse line she is needing a refill on her cough medicine and would like to speak to nurse.  Please f/u

## 2017-04-19 ENCOUNTER — Other Ambulatory Visit: Payer: Self-pay | Admitting: Family Medicine

## 2017-04-19 DIAGNOSIS — Z1231 Encounter for screening mammogram for malignant neoplasm of breast: Secondary | ICD-10-CM

## 2017-04-19 MED ORDER — MONTELUKAST SODIUM 10 MG PO TABS: 10.0000 mg | ORAL_TABLET | Freq: Every day | ORAL | 3 refills | Status: DC

## 2017-04-19 MED ORDER — BENZONATATE 100 MG PO CAPS: 100.0000 mg | ORAL_CAPSULE | Freq: Two times a day (BID) | ORAL | 1 refills | Status: DC | PRN

## 2017-04-19 NOTE — Telephone Encounter (Signed)
Patient informed of message below, verbalized understanding.  

## 2017-04-19 NOTE — Telephone Encounter (Signed)
Tessalon refilled and daily singulair prescribed also as chronic cough is likely allergy related she needs to take that

## 2017-04-19 NOTE — Telephone Encounter (Signed)
See previous message

## 2017-05-09 ENCOUNTER — Telehealth: Payer: Self-pay | Admitting: Family Medicine

## 2017-05-09 NOTE — Telephone Encounter (Signed)
Patient is requesting the topical cream Naproxen to add with the oral medication. Is that possible Requesting to call this in @ [place]

## 2017-05-10 NOTE — Telephone Encounter (Signed)
Called for more information but not available

## 2017-05-13 ENCOUNTER — Telehealth: Payer: Self-pay | Admitting: *Deleted

## 2017-05-13 NOTE — Telephone Encounter (Signed)
Patient called requesting nexipine cream to be refilled. Please advise

## 2017-05-13 NOTE — Telephone Encounter (Signed)
Patient calling about the topical Naproxen cream and also requesting benzonatate 100mg  for her cough.  If possible she is requesting that you make it "refillable a couple of times" .  She is requesting the liquid form if available too in addition to the tablets.    cb  [phone]

## 2017-05-14 NOTE — Telephone Encounter (Signed)
Patient called stating the pharmacy has not received her rx for her cream, patient states also the cough medicine.  Please advise [phone]. Patient requested the nurse to call her

## 2017-05-15 ENCOUNTER — Other Ambulatory Visit: Payer: Self-pay | Admitting: Family Medicine

## 2017-05-15 NOTE — Telephone Encounter (Signed)
See previous message

## 2017-05-15 NOTE — Telephone Encounter (Signed)
This is the correct message: Her tessalon perle sent in July already has a refill and there is no reason on file to sends in topical anti inflammatory  From her prob list , I recommend oTC tylenol for the occasional joint pain I tried speaking with the patient and she could not hear me  Pls disregard the first message sent

## 2017-05-15 NOTE — Telephone Encounter (Signed)
pls let pt know tessalon perle  Is sent in and topical anti inflammtory sent , which is voltaren, not naproxen , that is not available

## 2017-05-15 NOTE — Telephone Encounter (Signed)
I informed patient of message below. She keeps stating that she needs to topical cream, I told her Dr. Moshe Cipro was recommending the OTC Tylenol and that the cream was not justified by her problem list. She states she needs it for when she gets pain. She doesn't understand why she cant just get the cream. She states that she doesn't care if insurance will not pay for it. She will pay out of pocket as long as the cream is not more than $30-$40. I informed her that they could be expensive. She states that she wants the cream and why can't it just be sent in. I told her I would send another message to Dr. Moshe Cipro but I thought Dr. Moshe Cipro would stand by the OTC Tylenol.

## 2017-05-15 NOTE — Telephone Encounter (Signed)
pls explain since sghe states she is allergic to aspirin, cannot prescribe cram requested, she will break out. I recommend a trial of oTC  Biofreeze

## 2017-05-15 NOTE — Telephone Encounter (Signed)
pls see note on biofreeze

## 2017-05-16 ENCOUNTER — Other Ambulatory Visit: Payer: Self-pay | Admitting: Family Medicine

## 2017-05-16 MED ORDER — BENZONATATE 100 MG PO CAPS: 100.0000 mg | ORAL_CAPSULE | Freq: Two times a day (BID) | ORAL | 2 refills | Status: DC | PRN

## 2017-05-16 NOTE — Telephone Encounter (Signed)
Tessalon perles have been re sent. The gel she is requesting  Cannot be ordered because of stated allergic reactions on her record to aspirin

## 2017-05-16 NOTE — Telephone Encounter (Signed)
Patient would not consider biofreeze, wanting specifically the one she asked for originally and also tessalon perles. Please advise

## 2017-05-27 ENCOUNTER — Ambulatory Visit (HOSPITAL_COMMUNITY): Payer: Medicaid Other

## 2017-06-05 ENCOUNTER — Ambulatory Visit (INDEPENDENT_AMBULATORY_CARE_PROVIDER_SITE_OTHER): Payer: Medicaid Other | Admitting: Allergy & Immunology

## 2017-06-05 ENCOUNTER — Encounter: Payer: Self-pay | Admitting: Allergy & Immunology

## 2017-06-05 VITALS — BP 140/84 | HR 79 | Temp 98.3°F | Resp 19 | Ht 63.5 in | Wt 238.8 lb

## 2017-06-05 DIAGNOSIS — L508 Other urticaria: Secondary | ICD-10-CM

## 2017-06-05 DIAGNOSIS — J3089 Other allergic rhinitis: Secondary | ICD-10-CM | POA: Diagnosis not present

## 2017-06-05 MED ORDER — FEXOFENADINE HCL 180 MG PO TABS: ORAL_TABLET | ORAL | 5 refills | Status: DC

## 2017-06-05 MED ORDER — CETIRIZINE HCL 10 MG PO TABS: ORAL_TABLET | ORAL | 5 refills | Status: DC

## 2017-06-05 MED ORDER — RANITIDINE HCL 300 MG PO TABS: 300.0000 mg | ORAL_TABLET | Freq: Every day | ORAL | 5 refills | Status: DC

## 2017-06-05 MED ORDER — FLUTICASONE PROPIONATE 50 MCG/ACT NA SUSP: 2.0000 | Freq: Every day | NASAL | 5 refills | Status: DC

## 2017-06-05 NOTE — Patient Instructions (Addendum)
1. Chronic urticaria - Your history does not have any "red flags" such as fevers, joint pains, or permanent skin changes that would be concerning for a more serious cause of hives.  - We will get some labs to rule out serious causes of hives: complete blood count, tryptase level, chronic urticaria panel, CMP, ANA, and an alpha gal panel. - Chronic hives are often times a self limited process and will "burn themselves out" over 6-12 months, although this is not always the case.   - In the meantime, start suppressive dosing of antihistamines:   - Morning: Allegra (fexofenadine) 360mg  (two tablets)  - Evening: Zyrtec (cetirizine) 20mg  (two tablets) + Zantac (ranitidine) 300mg  + Singulair (montelukast) 10mg  - You can change this dosing at home, decreasing the dose as needed or increasing the dosing as needed.  - Use over-the-counter ointments to keep your skin moist (dry skin itches more). - If you are not tolerating the medications or are tired of taking them every day, we can start treatment with a monthly injectable medication called Xolair.   2. Perennial allergic rhinitis - We will get blood work to look for environmental allergies. - Your nose in congested, so start using Flonase (fluticasone) two sprays per nostril daily.  3. Return in about 6 weeks (around 07/17/2017).   Please inform us of any Emergency Department visits, hospitalizations, or changes in symptoms. Call us before going to the ED for breathing or allergy symptoms since we might be able to fit you in for a sick visit. Feel free to contact us anytime with any questions, problems, or concerns.  It was a pleasure to meet you today! Enjoy the rest of your summer!   Websites that have reliable patient information: 1. American Academy of Asthma, Allergy, and Immunology: www.aaaai.org 2. Food Allergy Research and Education (FARE): foodallergy.org 3. Mothers of Asthmatics: http://www.asthmacommunitynetwork.org 4. American College  of Allergy, Asthma, and Immunology: www.acaai.org   Election Day is coming up on Tuesday, November 6th! Make your voice heard! Register to vote at [email or web address]!      To keep your skin moist, apply Aquaphor, Eucerin, Vanicream, Cerave, or Vaseline jelly twice a day.

## 2017-06-05 NOTE — Progress Notes (Signed)
NEW PATIENT  Date of Service/Encounter:  06/05/17  Referring provider: Fayrene Helper, MD   Assessment:   Chronic urticaria - with some eczematous changes  Perennial allergic rhinitis   Plan/Recommendations:   1. Chronic urticaria - Your history does not have any "red flags" such as fevers, joint pains, or permanent skin changes that would be concerning for a more serious cause of hives.  - We will get some labs to rule out serious causes of hives: complete blood count, tryptase level, chronic urticaria panel, CMP, ANA, and an alpha gal panel. - Chronic hives are often times a self limited process and will "burn themselves out" over 6-12 months, although this is not always the case.   - In the meantime, start suppressive dosing of antihistamines:     - Morning: Allegra (fexofenadine) 360mg  (two tablets)    - Evening: Zyrtec (cetirizine) 20mg  (two tablets) + Zantac (ranitidine) 300mg  + Singulair (montelukast) 10mg  - You can change this dosing at home, decreasing the dose as needed or increasing the dosing as needed.  - Use over-the-counter ointments to keep your skin moist (dry skin itches more). - If you are not tolerating the medications or are tired of taking them every day, we can start treatment with a monthly injectable medication called Xolair.   2. Perennial allergic rhinitis - We will get blood work to look for environmental allergies. - Your nose in congested, so start using Flonase (fluticasone) two sprays per nostril daily.  3. Return in about 6 weeks (around 07/17/2017).    Subjective:   LETTA CARGILE is a 56 y.o. female presenting today for evaluation of  Chief Complaint  Patient presents with  . Allergies  . Urticaria  . Cough    Elmer Ramp Mazariego has a history of the following: Patient Active Problem List   Diagnosis Date Noted  . Back pain with sciatica 01/22/2016  . Postmenopause 12/16/2014  . Menopause present 08/19/2014  .  Dyslipidemia 05/29/2014  . Cervical high risk HPV (human papillomavirus) test positive 05/17/2014  . Endometrial polyp 12/17/2013  . PMB (postmenopausal bleeding) 11/16/2013  . Hormone replacement therapy (HRT) 09/01/2013  . Gynecomastia 01/05/2013  . Anxiety and depression 12/31/2010  . Seasonal allergies 12/31/2010  . HYPERLIPIDEMIA 04/18/2010  . Obesity 02/13/2010    History obtained from: chart review and patient.  Elmer Ramp Duch was referred by Fayrene Helper, MD.     Catalena is a 56 y.o. female presenting for evaluation of hives. The history is rather tangential and difficult to piece together. She reports today that she developed a cough at the beginning of this month or so. Around the time that she also developed hives. These hives are very pruritic and come and go. Upon resolution, they leave normal skin. There is no pain reported, but instead just intense pruritus. She has had no fevers or arthritis but the symptoms. The hives have involved her arms, chest, back, and legs. She has been using BenGay to treat this, as well as hydroxyzine 50 mg as needed. She is also on montelukast 10 mg at bedtime. She has never been on Zyrtec, Allegra, Claritin, or Xyzal.  The cough has since improved, and she does not think that there are any food triggers. She is able to tolerate all of the major food allergens without adverse event. She has placed stockings on her feet and arms to try to prevent her from itching. She has had no other systemic complaints at this, including  wheezing, swelling, or passing out, or gastrointestinal symptoms. She uses unscented products including Dove soap and free and clear laundry detergent.  Although she initially tells me that the hives started with this cough 1 month ago, eventually she shares that she has actually had hives for 20 years. She has been evaluated by multiple dermatologists including Dr. Delice Lesch out of [facility] and Dr. Nevada Crane in [place]. She  has actually been seen by Dr. Ishmael Holter, who has since left our practice. Her visit with Dr. Ishmael Holter included skin testing which evidently showed positives to mold according to the patient. Unfortunately, we do not have these records. She reports being put on a multitude of various medicines, but she does not remember the names. She does not think that she was ever on Xolair. She does report that the dermatologist gave her steroid injections, which did help. She has had no prednisone with this current bout of urticaria.  She does have a history of allergic rhinitis symptoms including itchy watery eyes as well as chronic nasal congestion. She has been on no spray in the past, but it is been several years. She has never been on allergy shots. She is not around cats and dogs, therefore she is unsure if this is a trigger. She does not have a history of asthma and has never needed an inhaler.  Otherwise, there is no history of other atopic diseases, including asthma, drug allergies, or stinging insect allergies. There is no significant infectious history. Vaccinations are up to date.    Past Medical History: Patient Active Problem List   Diagnosis Date Noted  . Back pain with sciatica 01/22/2016  . Postmenopause 12/16/2014  . Menopause present 08/19/2014  . Dyslipidemia 05/29/2014  . Cervical high risk HPV (human papillomavirus) test positive 05/17/2014  . Endometrial polyp 12/17/2013  . PMB (postmenopausal bleeding) 11/16/2013  . Hormone replacement therapy (HRT) 09/01/2013  . Gynecomastia 01/05/2013  . Anxiety and depression 12/31/2010  . Seasonal allergies 12/31/2010  . HYPERLIPIDEMIA 04/18/2010  . Obesity 02/13/2010    Medication List:  Allergies as of 06/05/2017      Reactions   Benadryl  [diphenhydramine Hcl (sleep)]    Benadryl [diphenhydramine Hcl] Other (See Comments)   Makes congestion worst    Dextromethorphan Hbr Hives   Phenol-glycerin Other (See Comments)   Makes congestion worst.     Robitussin (alcohol Free) [guaifenesin]    Makes cough worse   Aspirin Rash, Hives   rash   Diphenhydramine Hcl Hives, Rash   Makes congestion worst    Latex Rash   Phenol-glycerin Rash   Makes congestion worst.       Medication List       Accurate as of 06/05/17 12:17 PM. Always use your most recent med list.          acetaminophen 500 MG tablet Commonly known as:  TYLENOL Two TABLETS ART  BEDTIMNE   benzonatate 100 MG capsule Commonly known as:  TESSALON Take 1 capsule (100 mg total) by mouth 2 (two) times daily as needed for cough.   cetirizine 10 MG tablet Commonly known as:  ZYRTEC Take 2 tablets in the Evening for Chronic Urticaria.   cyclobenzaprine 10 MG tablet Commonly known as:  FLEXERIL Take 10 mg by mouth.   fexofenadine 180 MG tablet Commonly known as:  ALLEGRA Take 2 tablets in the morning for Chronic Urticaria   fluticasone 50 MCG/ACT nasal spray Commonly known as:  FLONASE Place 2 sprays into both nostrils daily.  hydrOXYzine 50 MG tablet Commonly known as:  ATARAX/VISTARIL Take 100 mg by mouth 2 (two) times daily.   montelukast 10 MG tablet Commonly known as:  SINGULAIR Take 1 tablet (10 mg total) by mouth at bedtime.   naproxen 500 MG tablet Commonly known as:  NAPROSYN TAKE (1) TABLET BY MOUTH TWICE A DAY AS NEEDED.   ranitidine 300 MG tablet Commonly known as:  ZANTAC Take 1 tablet (300 mg total) by mouth at bedtime.            Discharge Care Instructions        Start     Ordered   06/05/17 0000  Mycoplasma Pneumoniae,IgG,IgM     06/05/17 0921   06/05/17 0000  CBC with Differential/Platelet     06/05/17 0921   06/05/17 0000  Tryptase     06/05/17 0921   06/05/17 0000  Allergens Zone 2     06/05/17 0921   06/05/17 0000  Comprehensive Metabolic Panel (CMET)     06/05/17 0921   06/05/17 0000  ANA     06/05/17 0921   06/05/17 0000  Alpha-Gal Panel     06/05/17 0921   06/05/17 0000  fexofenadine (ALLEGRA) 180 MG  tablet     06/05/17 1001   06/05/17 0000  cetirizine (ZYRTEC) 10 MG tablet     06/05/17 1001   06/05/17 0000  ranitidine (ZANTAC) 300 MG tablet  Daily at bedtime     06/05/17 1001   06/05/17 0000  fluticasone (FLONASE) 50 MCG/ACT nasal spray  Daily     06/05/17 1001      Birth History: non-contributory.   Developmental History: non-contributory.   Past Surgical History: Past Surgical History:  Procedure Laterality Date  . CERVICAL POLYPECTOMY N/A 01/06/2014   Procedure: CERVICAL POLYPECTOMY;  Surgeon: Florian Buff, MD;  Location: [facility];  Service: Gynecology;  Laterality: N/A;  . COLONOSCOPY  09/19/2012   Procedure: COLONOSCOPY;  Surgeon: Danie Binder, MD;  Location: [facility];  Service: Endoscopy;  Laterality: N/A;  8:30 AM  . DILITATION & CURRETTAGE/HYSTROSCOPY WITH THERMACHOICE ABLATION N/A 01/06/2014   Procedure: DILATATION & CURETTAGE/HYSTEROSCOPY WITH THERMACHOICE ABLATION;  Surgeon: Florian Buff, MD;  Location: [facility];  Service: Gynecology;  Laterality: N/A;  total therapy time:  9 minutes 12 seconds ,     temperature:87 degrees  . NO PAST SURGERIES       Family History: Family History  Problem Relation Age of Onset  . Diabetes Father   . Heart disease Father   . Hypertension Father   . Hyperlipidemia Father   . Diabetes Maternal Uncle   . Anemia Maternal Uncle   . Arthritis Maternal Uncle   . Arthritis Maternal Grandmother   . Heart disease Maternal Grandfather        pacemaker  . Colon cancer Neg Hx   . Cancer Neg Hx   . Allergic rhinitis Neg Hx   . Angioedema Neg Hx   . Asthma Neg Hx   . Eczema Neg Hx   . Immunodeficiency Neg Hx   . Urticaria Neg Hx      Social History: Shelton lives at home with her parents. They live in a house with carpeting throughout. They have carried seem heating and central cooling. There are no animals inside or outside of the home. There are no dust mite covers on the bedding. There is no tobacco exposure. Currently she is not  working.     Review of  Systems: a 14-point review of systems is pertinent for what is mentioned in HPI.  Otherwise, all other systems were negative. Constitutional: negative other than that listed in the HPI Eyes: negative other than that listed in the HPI Ears, nose, mouth, throat, and face: negative other than that listed in the HPI Respiratory: negative other than that listed in the HPI Cardiovascular: negative other than that listed in the HPI Gastrointestinal: negative other than that listed in the HPI Genitourinary: negative other than that listed in the HPI Integument: negative other than that listed in the HPI Hematologic: negative other than that listed in the HPI Musculoskeletal: negative other than that listed in the HPI Neurological: negative other than that listed in the HPI Allergy/Immunologic: negative other than that listed in the HPI    Objective:   Blood pressure 140/84, pulse 79, temperature 98.3 F (36.8 C), temperature source Oral, resp. rate 19, height 5' 3.5" (1.613 m), weight 238 lb 12.8 oz (108.3 kg), last menstrual period 06/27/2013, SpO2 95 %. Body mass index is 41.64 kg/m.   Physical Exam:  General: Alert, interactive, in no acute distress. Obese female.  Eyes: No conjunctival injection present on the right, No conjunctival injection present on the left, PERRL bilaterally, No discharge on the right, No discharge on the left and No Horner-Trantas dots present Ears: Right TM pearly gray with normal light reflex, Left TM pearly gray with normal light reflex, Right TM intact without perforation and Left TM intact without perforation.  Nose/Throat: External nose within normal limits and septum midline, turbinates edematous with clear discharge, post-pharynx mildly erythematous without cobblestoning in the posterior oropharynx. Tonsils 2+ without exudates Neck: Supple without thyromegaly.  Adenopathy: Shoddy bilateral anterior cervical lymphadenopathy. and No  enlarged lymph nodes appreciated in the occipital, axillary, epitrochlear, inguinal, or popliteal regions. Lungs: Clear to auscultation without wheezing, rhonchi or rales. No increased work of breathing. CV: Normal S1/S2, no murmurs. Capillary refill <2 seconds.  Abdomen: Nondistended, nontender. No guarding or rebound tenderness. Bowel sounds present in all fields and hypoactive  Skin: Scattered erythematous urticarial type lesions primarily located bilateral arms, legs, neck, and chest , nonvesicular. Extremities:  No clubbing, cyanosis or edema. Neuro:   Grossly intact. No focal deficits appreciated. Responsive to questions.  Diagnostic studies: deferred due to recent antihistamine use      Salvatore Marvel, MD [facility]

## 2017-06-06 ENCOUNTER — Ambulatory Visit (HOSPITAL_COMMUNITY): Payer: Medicaid Other

## 2017-06-11 LAB — COMPREHENSIVE METABOLIC PANEL
A/G RATIO: 1.4 (ref 1.2–2.2)
ALK PHOS: 101 IU/L (ref 39–117)
ALT: 12 IU/L (ref 0–32)
AST: 18 IU/L (ref 0–40)
Albumin: 4.3 g/dL (ref 3.5–5.5)
BUN/Creatinine Ratio: 12 (ref 9–23)
BUN: 8 mg/dL (ref 6–24)
Bilirubin Total: 0.6 mg/dL (ref 0.0–1.2)
CO2: 20 mmol/L (ref 20–29)
Calcium: 9.5 mg/dL (ref 8.7–10.2)
Chloride: 107 mmol/L — ABNORMAL HIGH (ref 96–106)
Creatinine, Ser: 0.69 mg/dL (ref 0.57–1.00)
GFR calc Af Amer: 113 mL/min/{1.73_m2} (ref >59)
GFR calc non Af Amer: 98 mL/min/{1.73_m2} (ref >59)
GLOBULIN, TOTAL: 3 g/dL (ref 1.5–4.5)
Glucose: 85 mg/dL (ref 65–99)
Potassium: 4.7 mmol/L (ref 3.5–5.2)
SODIUM: 141 mmol/L (ref 134–144)
Total Protein: 7.3 g/dL (ref 6.0–8.5)

## 2017-06-11 LAB — CBC WITH DIFFERENTIAL/PLATELET
BASOS ABS: 0 10*3/uL (ref 0.0–0.2)
Basos: 0 %
EOS (ABSOLUTE): 0.1 10*3/uL (ref 0.0–0.4)
EOS: 2 %
HEMATOCRIT: 38.1 % (ref 34.0–46.6)
HEMOGLOBIN: 12.1 g/dL (ref 11.1–15.9)
IMMATURE GRANS (ABS): 0 10*3/uL (ref 0.0–0.1)
IMMATURE GRANULOCYTES: 0 %
LYMPHS: 34 %
Lymphocytes Absolute: 2.2 10*3/uL (ref 0.7–3.1)
MCH: 27.5 pg (ref 26.6–33.0)
MCHC: 31.8 g/dL (ref 31.5–35.7)
MCV: 87 fL (ref 79–97)
MONOCYTES: 4 %
Monocytes Absolute: 0.3 10*3/uL (ref 0.1–0.9)
NEUTROS PCT: 60 %
Neutrophils Absolute: 3.9 10*3/uL (ref 1.4–7.0)
Platelets: 276 10*3/uL (ref 150–379)
RBC: 4.4 x10E6/uL (ref 3.77–5.28)
RDW: 14.1 % (ref 12.3–15.4)
WBC: 6.4 10*3/uL (ref 3.4–10.8)

## 2017-06-11 LAB — IGE+ALLERGENS ZONE 2(30)
Alternaria Alternata IgE: <0.1 kU/L
Amer Sycamore IgE Qn: <0.1 kU/L
Aspergillus Fumigatus IgE: <0.1 kU/L
Cockroach, American IgE: <0.1 kU/L
Common Silver Birch IgE: <0.1 kU/L
D Farinae IgE: <0.1 kU/L
Dog Dander IgE: <0.1 kU/L
Hickory, White IgE: <0.1 kU/L
IGE (IMMUNOGLOBULIN E), SERUM: 41 [IU]/mL (ref 0–100)
Maple/Box Elder IgE: <0.1 kU/L
Mucor Racemosus IgE: <0.1 kU/L
Nettle IgE: <0.1 kU/L
Plantain, English IgE: <0.1 kU/L
Sheep Sorrel IgE Qn: <0.1 kU/L
Stemphylium Herbarum IgE: <0.1 kU/L
Sweet gum IgE RAST Ql: <0.1 kU/L
White Mulberry IgE: <0.1 kU/L

## 2017-06-11 LAB — ALPHA-GAL PANEL
ALPHA GAL IGE: 0.24 kU/L (ref <0.35)
Beef (Bos spp) IgE: <0.1 kU/L (ref <0.35)
Class Interpretation: 0
LAMB CLASS INTERPRETATION: 0
PORK CLASS INTERPRETATION: 0

## 2017-06-11 LAB — ANA: Anti Nuclear Antibody(ANA): NEGATIVE

## 2017-06-11 LAB — TRYPTASE: TRYPTASE: 7.5 ug/L (ref 2.2–13.2)

## 2017-06-11 LAB — MYCOPLASMA PNEUMONIAE,IGG,IGM
Mycoplasma pneumo IgG: 140 U/mL — ABNORMAL HIGH (ref 0–99)
Mycoplasma pneumo IgM: <770 U/mL (ref 0–769)

## 2017-06-14 ENCOUNTER — Ambulatory Visit (HOSPITAL_COMMUNITY)
Admission: RE | Admit: 2017-06-14 | Discharge: 2017-06-14 | Disposition: A | Discharge status: Home / Self Care | Payer: Medicaid Other | Source: Ambulatory Visit | Attending: Family Medicine | Admitting: Family Medicine

## 2017-06-14 DIAGNOSIS — Z1231 Encounter for screening mammogram for malignant neoplasm of breast: Secondary | ICD-10-CM | POA: Diagnosis present

## 2017-06-17 ENCOUNTER — Telehealth: Payer: Self-pay | Admitting: *Deleted

## 2017-06-17 NOTE — Telephone Encounter (Signed)
Will hold off on Xolair currently.

## 2017-06-17 NOTE — Telephone Encounter (Signed)
Noted. I sent a MyChart message about her results. Here is it:   Ms. Weisberg:   It was a pleasure meeting you last week! I wanted to let you know that your testing was mostly normal. Environmental allergy testing showed that you have no evidence of environmental allergies. Complete blood count was normal. Serum tryptase was negative, ruling out mast cell disease. Metabolic panel was normal including liver function testing. ANA was negative, which points against autoimmune disease. Alpha gal panel was negative, ruling out alpha gal sensitivity.   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 Mycoplasma is a bacteria that can trigger hives. Labs show that you have had the infection in the past, but not the very recent past. It still could be contributing to your episode of hives.   How are things going since the last visit?   Thanks,  Salvatore Marvel, MD  [facility]

## 2017-06-17 NOTE — Telephone Encounter (Signed)
Patient states her hives are under control currently and she will let us know if she starts having problems again.

## 2017-06-17 NOTE — Telephone Encounter (Signed)
I contacted patient to inquire about symptoms. She advised she was doing better at this time.  I advised her to keep her schedule followup and contact us if her symptoms come back and we would discuss Xolair option for her.  She did inquire regarding her labs and I advised her that someone would be in contact regarding those results.

## 2017-06-17 NOTE — Telephone Encounter (Signed)
-----   Message from Valentina Shaggy, MD sent at 06/16/2017  9:39 AM EDT ----- Could someone call to see how her hives are doing? If they are not controlled, we can work on getting Xolair approval. Lynelle Smoke is already aware.  Salvatore Marvel, MD [facility]

## 2017-06-17 NOTE — Telephone Encounter (Signed)
Patient informed of results.  

## 2017-06-17 NOTE — Telephone Encounter (Signed)
-----   Message from Valentina Shaggy, MD sent at 06/16/2017  9:39 AM EDT ----- Could someone call to see how her hives are doing? If they are not controlled, we can work on getting Xolair approval. Lynelle Smoke is already aware.  Salvatore Marvel, MD [facility] [place]

## 2017-06-24 ENCOUNTER — Telehealth: Payer: Self-pay | Admitting: Family Medicine

## 2017-06-24 NOTE — Telephone Encounter (Signed)
pls schedule an appointment for an OV specifically for this reason with me in October and explain to pt why she needs the visit

## 2017-06-24 NOTE — Telephone Encounter (Signed)
Patient calling to request a referral to Dr. Sandi Mealy in [place]. She is a Psychiatric nurse.  The patient would like to have a breast reduction. She spoke to their office this morning and found out that they do take medicaid, but they will need referral with office notes in order schedule.  Fax number  [phone]  Pt's cb  [phone]              [phone]

## 2017-07-16 ENCOUNTER — Ambulatory Visit: Payer: Medicaid Other | Admitting: Allergy & Immunology

## 2017-07-17 ENCOUNTER — Other Ambulatory Visit: Payer: Self-pay | Admitting: Family Medicine

## 2017-07-17 ENCOUNTER — Telehealth: Payer: Self-pay | Admitting: Family Medicine

## 2017-07-17 DIAGNOSIS — N951 Menopausal and female climacteric states: Secondary | ICD-10-CM

## 2017-07-17 NOTE — Telephone Encounter (Signed)
I spoke with pt, wants to go for menopause symptoms , despite going to [facility] for pap smears, I will refer her  Per her request  pls see referral

## 2017-07-17 NOTE — Progress Notes (Signed)
amb gyne  

## 2017-07-17 NOTE — Telephone Encounter (Signed)
Patient is requesting a referral for obgyn at [facility]  [place]  Fax# [phone]

## 2017-07-22 ENCOUNTER — Ambulatory Visit: Payer: Medicaid Other | Admitting: Family Medicine

## 2017-09-02 ENCOUNTER — Encounter: Payer: Self-pay | Admitting: Family Medicine

## 2017-09-02 ENCOUNTER — Ambulatory Visit: Payer: Medicaid Other | Admitting: Family Medicine

## 2017-09-02 VITALS — BP 124/80 | HR 86 | Resp 16 | Ht 64.0 in | Wt 243.0 lb

## 2017-09-02 DIAGNOSIS — E6609 Other obesity due to excess calories: Secondary | ICD-10-CM | POA: Diagnosis not present

## 2017-09-02 DIAGNOSIS — Z23 Encounter for immunization: Secondary | ICD-10-CM | POA: Diagnosis not present

## 2017-09-02 DIAGNOSIS — N62 Hypertrophy of breast: Secondary | ICD-10-CM

## 2017-09-02 DIAGNOSIS — E559 Vitamin D deficiency, unspecified: Secondary | ICD-10-CM | POA: Diagnosis not present

## 2017-09-02 DIAGNOSIS — E785 Hyperlipidemia, unspecified: Secondary | ICD-10-CM

## 2017-09-02 DIAGNOSIS — J302 Other seasonal allergic rhinitis: Secondary | ICD-10-CM

## 2017-09-02 DIAGNOSIS — Z6839 Body mass index (BMI) 39.0-39.9, adult: Secondary | ICD-10-CM | POA: Diagnosis not present

## 2017-09-02 DIAGNOSIS — Z1211 Encounter for screening for malignant neoplasm of colon: Secondary | ICD-10-CM

## 2017-09-02 NOTE — Patient Instructions (Signed)
F/u in 5.5 months, call if you need me before  Flu vaccine today  Fasting lipid, tSh and vit D today  Stool test today  Apothecary cream for left knee pain is being prescribed  It is important that you exercise regularly at least 30 minutes 5 times a week. If you develop chest pain, have severe difficulty breathing, or feel very tired, stop exercising immediately and seek medical attention   Please work on good  health habits so that your health will improve. 1. Commitment to daily physical activity for 30 to 60  minutes, if you are able to do this.  2. Commitment to wise food choices. Aim for half of your  food intake to be vegetable and fruit, one quarter starchy foods, and one quarter protein. Try to eat on a regular schedule  3 meals per day, snacking between meals should be limited to vegetables or fruits or small portions of nuts. 64 ounces of water per day is generally recommended, unless you have specific health conditions, like heart failure or kidney failure where you will need to limit fluid intake.  3. Commitment to sufficient and a  good quality of physical and mental rest daily, generally between 6 to 8 hours per day.  WITH PERSISTANCE AND PERSEVERANCE, THE IMPOSSIBLE , BECOMES THE NORM!

## 2017-09-03 ENCOUNTER — Telehealth: Payer: Self-pay | Admitting: *Deleted

## 2017-09-03 ENCOUNTER — Other Ambulatory Visit: Payer: Self-pay | Admitting: Family Medicine

## 2017-09-03 LAB — LIPID PANEL
CHOL/HDL RATIO: 5 (calc) — AB (ref <5.0)
CHOLESTEROL: 214 mg/dL — AB (ref <200)
HDL: 43 mg/dL — ABNORMAL LOW (ref >50)
LDL CHOLESTEROL (CALC): 142 mg/dL — AB
Non-HDL Cholesterol (Calc): 171 mg/dL (calc) — ABNORMAL HIGH (ref <130)
TRIGLYCERIDES: 158 mg/dL — AB (ref <150)

## 2017-09-03 LAB — VITAMIN D 25 HYDROXY (VIT D DEFICIENCY, FRACTURES): Vit D, 25-Hydroxy: 11 ng/mL — ABNORMAL LOW (ref 30–100)

## 2017-09-03 LAB — TSH: TSH: 1.97 mIU/L

## 2017-09-03 MED ORDER — ERGOCALCIFEROL 1.25 MG (50000 UT) PO CAPS: 50000.0000 [IU] | ORAL_CAPSULE | ORAL | 1 refills | Status: DC

## 2017-09-03 NOTE — Telephone Encounter (Signed)
Patient called requesting for Brandi to call her regarding the naproxen, patient states she wants the generic cream instead of pills. Patient states she wants to pay a 3$ copay. Please advise

## 2017-09-03 NOTE — Telephone Encounter (Signed)
Patient is aware 

## 2017-09-03 NOTE — Telephone Encounter (Signed)
There were no pills sent yesterday. Only the compound apothecary cream. This is the cheapest pain cream available

## 2017-09-06 ENCOUNTER — Encounter: Payer: Self-pay | Admitting: Family Medicine

## 2017-09-06 DIAGNOSIS — E559 Vitamin D deficiency, unspecified: Secondary | ICD-10-CM | POA: Insufficient documentation

## 2017-09-06 NOTE — Assessment & Plan Note (Signed)
Deteriorated. Patient re-educated about  the importance of commitment to a  minimum of 150 minutes of exercise per week.  The importance of healthy food choices with portion control discussed. Encouraged to start a food diary, count calories and to consider  joining a support group. Sample diet sheets offered. Goals set by the patient for the next several months.   Weight /BMI 09/02/2017 06/05/2017 03/11/2017  WEIGHT 243 lb 238 lb 12.8 oz 238 lb  HEIGHT 5\' 4"  5' 3.5" 5\' 5"   BMI 41.71 kg/m2 41.64 kg/m2 39.61 kg/m2

## 2017-09-06 NOTE — Assessment & Plan Note (Signed)
Low level of 11, needs weekly supplement for at least 6 months

## 2017-09-06 NOTE — Assessment & Plan Note (Signed)
Significant and symptomatic, needs to work on weight loss , surgery will not be covered by insurance and she is aware

## 2017-09-06 NOTE — Assessment & Plan Note (Signed)
Controlled, no change in medication  

## 2017-09-06 NOTE — Progress Notes (Addendum)
   Karen Cox     MRN: [id number]      DOB: 01-20-1961   HPI Ms. Karen Cox is here for follow up and re-evaluation of chronic medical conditions, medication management and review of any available recent lab and radiology data.  Preventive health is updated, specifically  Cancer screening and Immunization.   Has been trying to get breast reduction surgery but now has come to the realization that this is not going to be covered by  Medicaid, so she is now going to re focus on weight loss. The PT denies any adverse reactions to current medications since the last visit.  There are no new concerns.  There are no specific complaints   ROS Denies recent fever or chills. Denies sinus pressure, nasal congestion, ear pain or sore throat. Denies chest congestion, productive cough or wheezing. Denies chest pains, palpitations and leg swelling Denies abdominal pain, nausea, vomiting,diarrhea or constipation.   Denies dysuria, frequency, hesitancy or incontinence. Denies joint pain, swelling and limitation in mobility. Denies headaches, seizures, numbness, or tingling. Denies depression, anxiety or insomnia. Denies skin break down or rash.   PE  BP 124/80   Pulse 86   Resp 16   Ht 5\' 4"  (1.626 m)   Wt 243 lb (110.2 kg)   LMP 06/27/2013   SpO2 99%   BMI 41.71 kg/m   Patient alert and oriented and in no cardiopulmonary distress.  HEENT: No facial asymmetry, EOMI,   oropharynx pink and moist.  Neck supple no JVD, no mass.  Chest: Clear to auscultation bilaterally.  CVS: S1, S2 no murmurs, no S3.Regular rate.  ABD: Soft non tender.  Rectal: no mass, heme negative stool Ext: No edema  MS: Adequate ROM spine, shoulders, hips and knees.  Skin: Intact, no ulcerations or rash noted.  Psych: Good eye contact, normal affect. Memory intact not anxious or depressed appearing.  CNS: CN 2-12 intact, power,  normal throughout.no focal deficits noted.   Assessment &  Plan  Seasonal allergies Controlled, no change in medication   Obesity Deteriorated. Patient re-educated about  the importance of commitment to a  minimum of 150 minutes of exercise per week.  The importance of healthy food choices with portion control discussed. Encouraged to start a food diary, count calories and to consider  joining a support group. Sample diet sheets offered. Goals set by the patient for the next several months.   Weight /BMI 09/02/2017 06/05/2017 03/11/2017  WEIGHT 243 lb 238 lb 12.8 oz 238 lb  HEIGHT 5\' 4"  5' 3.5" 5\' 5"   BMI 41.71 kg/m2 41.64 kg/m2 39.61 kg/m2      Hyperlipidemia LDL goal <100 Hyperlipidemia:Low fat diet discussed and encouraged.   Lipid Panel  Lab Results  Component Value Date   CHOL 214 (H) 09/02/2017   HDL 43 (L) 09/02/2017   LDLCALC 124 (H) 08/17/2016   TRIG 158 (H) 09/02/2017   CHOLHDL 5.0 (H) 09/02/2017    uncontrolled , needs to lower fat intake and increase activity   Gynecomastia Significant and symptomatic, needs to work on weight loss , surgery will not be covered by insurance and she is aware  Vitamin D deficiency Low level of 11, needs weekly supplement for at least 6 months

## 2017-09-06 NOTE — Assessment & Plan Note (Signed)
Hyperlipidemia:Low fat diet discussed and encouraged.   Lipid Panel  Lab Results  Component Value Date   CHOL 214 (H) 09/02/2017   HDL 43 (L) 09/02/2017   LDLCALC 124 (H) 08/17/2016   TRIG 158 (H) 09/02/2017   CHOLHDL 5.0 (H) 09/02/2017    uncontrolled , needs to lower fat intake and increase activity

## 2017-11-06 ENCOUNTER — Telehealth: Payer: Self-pay | Admitting: Family Medicine

## 2017-11-06 ENCOUNTER — Other Ambulatory Visit: Payer: Self-pay | Admitting: Family Medicine

## 2017-11-06 NOTE — Telephone Encounter (Signed)
pls let her know that effexor is a good medication for stress, anxiety and hot flashes , so I am sending in to her pharmacy

## 2017-11-06 NOTE — Progress Notes (Signed)
effexor and no SSRI can be prescribed due to stated allergy

## 2017-11-06 NOTE — Telephone Encounter (Signed)
Routing to Dr Moshe Cipro for review

## 2017-11-06 NOTE — Telephone Encounter (Signed)
Update, unfortunately, the medication recommended, effexor, and a,l the similar meds like fluoxetinme, she is allergic to so nothing to recommen

## 2017-11-06 NOTE — Telephone Encounter (Signed)
Patient called in to let Dr.Simpson know that she had testing done for her hot flashes. She is not menopausal. she says that she was told it is or can come from stress, anxiety, and/or medication. She is requesting a medication to help with these hot flashes. Cb#: [phone]

## 2017-11-14 NOTE — Telephone Encounter (Signed)
Letter sent.

## 2017-11-18 ENCOUNTER — Telehealth: Payer: Self-pay | Admitting: Allergy & Immunology

## 2017-11-18 NOTE — Telephone Encounter (Signed)
Patient had an appointment tomorrow, 11-19-17, in [place]. She cancelled because her rash is gone. She is requesting a refill on, she said, a medicine that started with an R. The only one I see is ranitidine. [employer].

## 2017-11-18 NOTE — Telephone Encounter (Signed)
Called and spoke with patient and I informed her that she was last seen 07/17/2017 and was to return in 6 weeks. I informed patient she would need an OV for refills. I asked if she would like to keep her appointment for tomorrow and just make it an office visit, patient denied and said she would come in April. I informed patient again that in order to get refills on her medication the she would need an OV and she understood however she still don't want to come in until April. She made an appointment April 23rd.

## 2017-11-19 ENCOUNTER — Ambulatory Visit: Payer: Medicaid Other | Admitting: Allergy & Immunology

## 2017-11-21 ENCOUNTER — Telehealth: Payer: Self-pay | Admitting: Family Medicine

## 2017-11-21 NOTE — Telephone Encounter (Signed)
The only meds that Dr prescribes for hotflashes the patient is allergic to so nothing can be perscribed to her for the hotflashes

## 2017-11-21 NOTE — Telephone Encounter (Signed)
Pt states that her Allergist told her she was only allergic to Bacteria, and that was causing her to have hives. I advised her to have the Allergist send records to Dr Moshe Cipro.

## 2017-11-21 NOTE — Telephone Encounter (Signed)
Please calll patient to go over last letter send to her from [place]  Cb#: [phone]

## 2017-12-23 ENCOUNTER — Telehealth: Payer: Self-pay | Admitting: Allergy & Immunology

## 2017-12-23 NOTE — Telephone Encounter (Signed)
Pt called and wanted to let you know that her hives are back and she thinks that she need to go on the shot every 6 months xolair.her appointment is April 23

## 2017-12-25 NOTE — Telephone Encounter (Signed)
Patient is calling back due to her itching all over and hives. She is wondering can she start xolair.   Please Advise

## 2017-12-26 NOTE — Telephone Encounter (Signed)
I will go ahead and submit for Xolair and be in touch with patient

## 2017-12-26 NOTE — Telephone Encounter (Signed)
I reached out to patient to explain the process of submit.  Schedule for start samples for next Tues.

## 2017-12-26 NOTE — Telephone Encounter (Signed)
Reviewed note. Karen Cox is already aware of her, but at the last contact Karen Cox was doing well with antihistamines only. We can get her in for a sample and get the paperwork started for approval.  Salvatore Marvel, MD Allergy and [place] [facility]

## 2017-12-31 ENCOUNTER — Ambulatory Visit (INDEPENDENT_AMBULATORY_CARE_PROVIDER_SITE_OTHER): Payer: Medicaid Other | Admitting: *Deleted

## 2017-12-31 ENCOUNTER — Ambulatory Visit: Payer: Medicaid Other

## 2017-12-31 DIAGNOSIS — L508 Other urticaria: Secondary | ICD-10-CM

## 2017-12-31 DIAGNOSIS — L501 Idiopathic urticaria: Secondary | ICD-10-CM | POA: Diagnosis not present

## 2017-12-31 MED ORDER — CETIRIZINE HCL 10 MG PO TABS: ORAL_TABLET | ORAL | 5 refills | Status: DC

## 2017-12-31 MED ORDER — EPINEPHRINE 0.3 MG/0.3ML IJ SOAJ: 0.3000 mg | Freq: Once | INTRAMUSCULAR | 0 refills | Status: AC

## 2017-12-31 MED ORDER — RANITIDINE HCL 300 MG PO TABS: 300.0000 mg | ORAL_TABLET | Freq: Every day | ORAL | 5 refills | Status: DC

## 2017-12-31 NOTE — Telephone Encounter (Signed)
Noted - thanks for taking care of her!   Salvatore Marvel, MD Allergy and [place] [facility] [place]

## 2018-01-01 ENCOUNTER — Telehealth: Payer: Self-pay | Admitting: Allergy & Immunology

## 2018-01-01 MED ORDER — OMALIZUMAB 150 MG ~~LOC~~ SOLR
300.0000 mg | SUBCUTANEOUS | Status: AC
  Administered 2017-12-31: 300 mg via SUBCUTANEOUS

## 2018-01-01 NOTE — Telephone Encounter (Signed)
Pt called and needs a rx  For a cream for her skin. [facility] on [address] [phone].

## 2018-01-01 NOTE — Progress Notes (Signed)
Immunotherapy   Patient Details  Name: Karen Cox MRN: [id number] Date of Birth: June 12, 1961  12/31/17  Patient came to the [place] office to start Xolair injections.  Patient received a sample of Xolair in office.  Lot number [id number] with Expiration date 11/08/2020.  Patient given 300 mg.   Patient will receive 300 mg every 28 days. Consent signed and instructions reviewed multiple times with patient.   Reviewed Emergency Action Plan and Epi-Pen usage and instructions with patient and she was unable to understand correct usage and time of usage. Patient wanted to use Epi-pen and Benadryl on a daily basis.   Dr. Ernst Bowler was consulted and patient will wait 60 minutes after every injection and will not have an Epi-Pen.  Patient did wait 60 minutes after injection and no issues.  Called and checked on patient on 01/01/18 and she was doing well and felt her hives were a little better.         Maree Erie 01/01/2018, 10:44 AM

## 2018-01-01 NOTE — Telephone Encounter (Signed)
Called and spoke with patient and informed her that there is no cream on her medication list and asked what medication she is talking about. She stated that she doesn't have any cream but needs some. I informed her that she is due for an OV and would need to be seen in order to get a RX for a cream. Patient understood and has an OV for next month.

## 2018-01-13 ENCOUNTER — Telehealth: Payer: Self-pay | Admitting: Family Medicine

## 2018-01-13 NOTE — Telephone Encounter (Signed)
Requesting a referral to "" someone whom can go under her skin and analyze it for a hives rash that comes and goes for years and years."" Per patient she has seen over 8 doctors and no one knows what is wrong with her. Cb#: [phone]

## 2018-01-14 ENCOUNTER — Telehealth: Payer: Self-pay | Admitting: Family Medicine

## 2018-01-14 NOTE — Telephone Encounter (Signed)
Patient called to request an update on yesterdays request for a referral to analyze the rash she has been getting for "years and years and years""  She keeps bringing up [facility] dermatology.  Can I have a referral there ? I can build this referral with your permission.

## 2018-01-21 NOTE — Telephone Encounter (Signed)
Seeing allergist, receiving shots, seem to be working. Patient can not come in 01/27/18

## 2018-01-21 NOTE — Telephone Encounter (Signed)
pls give appt next week so I can document on te rashy before referral , thanks

## 2018-01-21 NOTE — Telephone Encounter (Signed)
Can patient have a referral here? States she has been having this problem rash for years and wants it checked to find out whats causing it. Please advise

## 2018-01-21 NOTE — Telephone Encounter (Signed)
Please schedule for 4/22 per dr Moshe Cipro

## 2018-01-21 NOTE — Telephone Encounter (Signed)
See previous message

## 2018-01-28 ENCOUNTER — Ambulatory Visit: Payer: Medicaid Other

## 2018-01-28 ENCOUNTER — Ambulatory Visit: Payer: Self-pay | Admitting: Allergy & Immunology

## 2018-01-28 ENCOUNTER — Ambulatory Visit: Payer: Medicaid Other | Admitting: Allergy & Immunology

## 2018-02-03 ENCOUNTER — Telehealth: Payer: Self-pay | Admitting: Family Medicine

## 2018-02-03 ENCOUNTER — Ambulatory Visit: Payer: Medicaid Other | Admitting: Family Medicine

## 2018-02-03 NOTE — Telephone Encounter (Signed)
Patient is requesting a referral to [facility] for an analysis of her skin due to constant hives.  [phone]

## 2018-02-04 ENCOUNTER — Other Ambulatory Visit: Payer: Self-pay | Admitting: Family Medicine

## 2018-02-04 DIAGNOSIS — L509 Urticaria, unspecified: Secondary | ICD-10-CM

## 2018-02-04 NOTE — Telephone Encounter (Signed)
Pt is referreed to dermatology at [facility] per her reques to evalute recurrent hives, please see the referral and le her know this  Has been entered

## 2018-02-08 ENCOUNTER — Other Ambulatory Visit: Payer: Self-pay | Admitting: Family Medicine

## 2018-02-26 ENCOUNTER — Telehealth: Payer: Self-pay | Admitting: *Deleted

## 2018-02-26 NOTE — Telephone Encounter (Signed)
Just FYI patient was contacted by specialty pharmacy regarding her Xolair and get same shipped and she advised she did not want to take any more injections. She had one set of injections on 12/31/17 and has never returned for additional injs. I would call patient and try to explain but I have talked to her multiple times prior to starting injs for her hives and I don't think I can anything else at this time to make her understand the value and schedule for receiving this therapy

## 2018-02-27 NOTE — Telephone Encounter (Signed)
Noted thanks for letting me know.  Salvatore Marvel, MD Allergy and [place] [facility] [place]

## 2018-03-04 ENCOUNTER — Ambulatory Visit: Payer: Medicaid Other | Admitting: Family Medicine

## 2018-03-04 ENCOUNTER — Encounter: Payer: Self-pay | Admitting: Family Medicine

## 2018-03-04 VITALS — BP 126/74 | HR 79 | Resp 16 | Ht 64.0 in | Wt 225.0 lb

## 2018-03-04 DIAGNOSIS — E785 Hyperlipidemia, unspecified: Secondary | ICD-10-CM

## 2018-03-04 DIAGNOSIS — F419 Anxiety disorder, unspecified: Secondary | ICD-10-CM | POA: Diagnosis not present

## 2018-03-04 DIAGNOSIS — J302 Other seasonal allergic rhinitis: Secondary | ICD-10-CM

## 2018-03-04 DIAGNOSIS — E559 Vitamin D deficiency, unspecified: Secondary | ICD-10-CM | POA: Diagnosis not present

## 2018-03-04 DIAGNOSIS — F329 Major depressive disorder, single episode, unspecified: Secondary | ICD-10-CM

## 2018-03-04 DIAGNOSIS — N951 Menopausal and female climacteric states: Secondary | ICD-10-CM | POA: Diagnosis not present

## 2018-03-04 DIAGNOSIS — F32A Depression, unspecified: Secondary | ICD-10-CM

## 2018-03-04 MED ORDER — FLUTICASONE PROPIONATE 50 MCG/ACT NA SUSP: 2.0000 | Freq: Every day | NASAL | 5 refills | Status: DC

## 2018-03-04 NOTE — Patient Instructions (Addendum)
F/U in 6 months, call if you need me sooner  Congrats on weight loss, keep it up  Labs  Today TSH, vit D, lipid and chem 7  No medication change  Thank you  for choosing [facility]. We consider it a privelige to serve you.  Delivering excellent health care in a caring and  compassionate way is our goal.  Partnering with you,  so that together we can achieve this goal is our strategy.

## 2018-03-05 LAB — BASIC METABOLIC PANEL
BUN: 12 mg/dL (ref 7–25)
CO2: 22 mmol/L (ref 20–32)
CREATININE: 0.69 mg/dL (ref 0.50–1.05)
Calcium: 9.8 mg/dL (ref 8.6–10.4)
Chloride: 105 mmol/L (ref 98–110)
Glucose, Bld: 80 mg/dL (ref 65–99)
POTASSIUM: 4.2 mmol/L (ref 3.5–5.3)
SODIUM: 138 mmol/L (ref 135–146)

## 2018-03-05 LAB — LIPID PANEL
CHOL/HDL RATIO: 4 (calc) (ref <5.0)
CHOLESTEROL: 195 mg/dL (ref <200)
HDL: 49 mg/dL — AB (ref >50)
LDL Cholesterol (Calc): 124 mg/dL (calc) — ABNORMAL HIGH
Non-HDL Cholesterol (Calc): 146 mg/dL (calc) — ABNORMAL HIGH (ref <130)
Triglycerides: 108 mg/dL (ref <150)

## 2018-03-05 LAB — TSH: TSH: 3.32 m[IU]/L

## 2018-03-05 LAB — VITAMIN D 25 HYDROXY (VIT D DEFICIENCY, FRACTURES): VIT D 25 HYDROXY: 26 ng/mL — AB (ref 30–100)

## 2018-03-06 ENCOUNTER — Other Ambulatory Visit: Payer: Self-pay | Admitting: Family Medicine

## 2018-03-06 MED ORDER — ERGOCALCIFEROL 1.25 MG (50000 UT) PO CAPS: 50000.0000 [IU] | ORAL_CAPSULE | ORAL | 3 refills | Status: DC

## 2018-03-08 ENCOUNTER — Encounter: Payer: Self-pay | Admitting: Family Medicine

## 2018-03-08 DIAGNOSIS — N951 Menopausal and female climacteric states: Secondary | ICD-10-CM | POA: Insufficient documentation

## 2018-03-08 NOTE — Assessment & Plan Note (Signed)
Hyperlipidemia:Low fat diet discussed and encouraged.   Lipid Panel  Lab Results  Component Value Date   CHOL 195 03/04/2018   HDL 49 (L) 03/04/2018   LDLCALC 124 (H) 03/04/2018   TRIG 108 03/04/2018   CHOLHDL 4.0 03/04/2018   Needs to increase exercise commitment and reduce fried and fatty food intake

## 2018-03-08 NOTE — Assessment & Plan Note (Signed)
Controlled, no change in medication  

## 2018-03-08 NOTE — Progress Notes (Signed)
Karen Cox     MRN: [id number]      DOB: Oct 09, 1960   HPI Karen Cox is here for follow up and re-evaluation of chronic medical conditions, medication management and review of any available recent lab and radiology data.  Preventive health is updated, specifically  Cancer screening and Immunization.   Questions or concerns regarding consultations or procedures which the PT has had in the interim are  Addressed. She has an upcoming appt at [facility] per her request to address a rash of over 20 years which itches and is primarily on upper extremities The PT denies any adverse reactions to current medications since the last visit. On wellbutrin from gyne for hot flashes and doing very well with this There are no new concerns.  There are no specific complaints  She has done extremely welll as far as weight loss since her last visit, with lifestyle change ROS Denies recent fever or chills. Denies sinus pressure, nasal congestion, ear pain or sore throat. Denies chest congestion, productive cough or wheezing. Denies chest pains, palpitations and leg swelling Denies abdominal pain, nausea, vomiting,diarrhea or constipation.   Denies dysuria, frequency, hesitancy or incontinence. Denies joint pain, swelling and limitation in mobility. Denies headaches, seizures, numbness, or tingling. Denies depression, uncontrolled  anxiety or insomnia. Denies skin break down or rash.   PE  BP 126/74   Pulse 79   Resp 16   Ht 5\' 4"  (1.626 m)   Wt 225 lb (102.1 kg)   LMP 06/27/2013   SpO2 96%   BMI 38.62 kg/m   Patient alert and oriented and in no cardiopulmonary distress.  HEENT: No facial asymmetry, EOMI,   oropharynx pink and moist.  Neck supple no JVD, no mass.  Chest: Clear to auscultation bilaterally.  CVS: S1, S2 no murmurs, no S3.Regular rate.  ABD: Soft non tender.   Ext: No edema  MS: Adequate ROM spine, shoulders, hips and knees.  Skin: Intact, no ulcerations or rash  noted.  Psych: Good eye contact, normal affect. Memory intact not anxious or depressed appearing.  CNS: CN 2-12 intact, power,  normal throughout.no focal deficits noted.   Assessment & Plan  Morbid obesity ([place]) Markedly improved, patient applauded on this. Patient re-educated about  the importance of commitment to a  minimum of 150 minutes of exercise per week.  The importance of healthy food choices with portion control discussed. Encouraged to start a food diary, count calories and to consider  joining a support group. Sample diet sheets offered. Goals set by the patient for the next several months.   Weight /BMI 03/04/2018 09/02/2017 06/05/2017  WEIGHT 225 lb 243 lb 238 lb 12.8 oz  HEIGHT 5\' 4"  5\' 4"  5' 3.5"  BMI 38.62 kg/m2 41.71 kg/m2 41.64 kg/m2      Anxiety and depression Controlled, no change in medication, treated by psychiatry  Hyperlipidemia LDL goal <100 Hyperlipidemia:Low fat diet discussed and encouraged.   Lipid Panel  Lab Results  Component Value Date   CHOL 195 03/04/2018   HDL 49 (L) 03/04/2018   LDLCALC 124 (H) 03/04/2018   TRIG 108 03/04/2018   CHOLHDL 4.0 03/04/2018   Needs to increase exercise commitment and reduce fried and fatty food intake   Seasonal allergies Controlled, no change in medication   Hot flashes due to menopause Reports marked improvement in symptom on Wellbutrin which is prescribed b gyne , she will continue same  Vitamin D deficiency Improved , continue weekly supplement for an  additional 6 months

## 2018-03-08 NOTE — Assessment & Plan Note (Addendum)
Controlled, no change in medication, treated by psychiatry

## 2018-03-08 NOTE — Assessment & Plan Note (Signed)
Markedly improved, patient applauded on this. Patient re-educated about  the importance of commitment to a  minimum of 150 minutes of exercise per week.  The importance of healthy food choices with portion control discussed. Encouraged to start a food diary, count calories and to consider  joining a support group. Sample diet sheets offered. Goals set by the patient for the next several months.   Weight /BMI 03/04/2018 09/02/2017 06/05/2017  WEIGHT 225 lb 243 lb 238 lb 12.8 oz  HEIGHT 5\' 4"  5\' 4"  5' 3.5"  BMI 38.62 kg/m2 41.71 kg/m2 41.64 kg/m2

## 2018-03-08 NOTE — Assessment & Plan Note (Signed)
Reports marked improvement in symptom on Wellbutrin which is prescribed b gyne , she will continue same

## 2018-03-08 NOTE — Assessment & Plan Note (Signed)
Improved , continue weekly supplement for an additional 6 months

## 2018-03-27 ENCOUNTER — Other Ambulatory Visit: Payer: Self-pay | Admitting: Family Medicine

## 2018-03-27 DIAGNOSIS — Z1231 Encounter for screening mammogram for malignant neoplasm of breast: Secondary | ICD-10-CM

## 2018-06-16 ENCOUNTER — Encounter (HOSPITAL_COMMUNITY): Payer: Medicaid Other

## 2018-06-24 ENCOUNTER — Encounter (HOSPITAL_COMMUNITY): Payer: Medicaid Other

## 2018-06-25 ENCOUNTER — Ambulatory Visit (HOSPITAL_COMMUNITY)
Admission: RE | Admit: 2018-06-25 | Discharge: 2018-06-25 | Disposition: A | Discharge status: Home / Self Care | Payer: Medicaid Other | Source: Ambulatory Visit | Attending: Family Medicine | Admitting: Family Medicine

## 2018-06-25 DIAGNOSIS — Z1231 Encounter for screening mammogram for malignant neoplasm of breast: Secondary | ICD-10-CM

## 2018-07-04 ENCOUNTER — Encounter: Payer: Self-pay | Admitting: Family Medicine

## 2018-07-12 ENCOUNTER — Encounter: Payer: Self-pay | Admitting: Family Medicine

## 2018-07-13 ENCOUNTER — Encounter: Payer: Self-pay | Admitting: Family Medicine

## 2018-08-05 ENCOUNTER — Telehealth: Payer: Self-pay | Admitting: Family Medicine

## 2018-08-05 NOTE — Telephone Encounter (Signed)
PT is calling LVM that she needs medicine for a cough

## 2018-08-05 NOTE — Telephone Encounter (Signed)
Patient states she is having a non-productive "hacking cough", denies fever, running nose yesterday. Advised patient to try OTC Delsym cough medicine for a few days, if symptoms worsen to call us back

## 2018-08-06 ENCOUNTER — Other Ambulatory Visit: Payer: Self-pay | Admitting: Family Medicine

## 2018-08-06 MED ORDER — BENZONATATE 100 MG PO CAPS: 100.0000 mg | ORAL_CAPSULE | Freq: Two times a day (BID) | ORAL | 0 refills | Status: DC | PRN

## 2018-08-06 NOTE — Progress Notes (Signed)
Tessalon perle

## 2018-08-06 NOTE — Telephone Encounter (Signed)
Pt is wanting the Tesslon Pearls called in .

## 2018-08-06 NOTE — Telephone Encounter (Signed)
Medication sent in as requested pl s let her know

## 2018-08-06 NOTE — Telephone Encounter (Signed)
Patient did not try the OTC Delsym that was recommended, continues to complain of a dry persistent cough. Wants to be prescribed Tessalon Pearls. Do you want me to call in a RX for these?

## 2018-08-06 NOTE — Telephone Encounter (Signed)
Patient notified of medication being called in

## 2018-08-18 ENCOUNTER — Encounter: Payer: Self-pay | Admitting: Family Medicine

## 2018-09-05 ENCOUNTER — Other Ambulatory Visit: Payer: Self-pay | Admitting: Family Medicine

## 2018-09-08 ENCOUNTER — Ambulatory Visit: Payer: Medicaid Other | Admitting: Family Medicine

## 2018-09-08 NOTE — Telephone Encounter (Signed)
Pt needs a refill on medication benzonatate. Doesn't have a cough right at the moment but she takes as needed. Can be sent to [facility]

## 2018-09-22 ENCOUNTER — Encounter: Payer: Self-pay | Admitting: Family Medicine

## 2018-09-23 ENCOUNTER — Other Ambulatory Visit: Payer: Self-pay | Admitting: Family Medicine

## 2018-09-23 MED ORDER — BENZONATATE 100 MG PO CAPS: 100.0000 mg | ORAL_CAPSULE | Freq: Two times a day (BID) | ORAL | 0 refills | Status: DC | PRN

## 2018-10-06 ENCOUNTER — Telehealth: Payer: Self-pay | Admitting: *Deleted

## 2018-10-06 NOTE — Telephone Encounter (Signed)
Pt wants Dr. Moshe Cipro to call in herbal medicine phytooestrogens. She said she needs it for menopause.

## 2018-10-06 NOTE — Telephone Encounter (Signed)
Dr Moshe Cipro does not prescribe hormone medications. She needs to call her OBGYN

## 2018-10-09 ENCOUNTER — Ambulatory Visit (INDEPENDENT_AMBULATORY_CARE_PROVIDER_SITE_OTHER): Payer: Medicaid Other | Admitting: Family Medicine

## 2018-10-09 ENCOUNTER — Encounter: Payer: Self-pay | Admitting: Family Medicine

## 2018-10-09 VITALS — BP 116/70 | HR 92 | Resp 12 | Ht 65.0 in | Wt 214.1 lb

## 2018-10-09 DIAGNOSIS — Z23 Encounter for immunization: Secondary | ICD-10-CM | POA: Diagnosis not present

## 2018-10-09 DIAGNOSIS — N62 Hypertrophy of breast: Secondary | ICD-10-CM

## 2018-10-09 DIAGNOSIS — N951 Menopausal and female climacteric states: Secondary | ICD-10-CM

## 2018-10-09 DIAGNOSIS — E785 Hyperlipidemia, unspecified: Secondary | ICD-10-CM | POA: Diagnosis not present

## 2018-10-09 DIAGNOSIS — F419 Anxiety disorder, unspecified: Secondary | ICD-10-CM

## 2018-10-09 DIAGNOSIS — J302 Other seasonal allergic rhinitis: Secondary | ICD-10-CM

## 2018-10-09 DIAGNOSIS — F32A Depression, unspecified: Secondary | ICD-10-CM

## 2018-10-09 DIAGNOSIS — F329 Major depressive disorder, single episode, unspecified: Secondary | ICD-10-CM

## 2018-10-09 LAB — LIPID PANEL
Cholesterol: 228 mg/dL — ABNORMAL HIGH (ref <200)
HDL: 54 mg/dL (ref >50)
LDL Cholesterol (Calc): 152 mg/dL (calc) — ABNORMAL HIGH
Non-HDL Cholesterol (Calc): 174 mg/dL (calc) — ABNORMAL HIGH (ref <130)
Total CHOL/HDL Ratio: 4.2 (calc) (ref <5.0)
Triglycerides: 102 mg/dL (ref <150)

## 2018-10-09 LAB — CBC
HCT: 38.2 % (ref 35.0–45.0)
Hemoglobin: 12.3 g/dL (ref 11.7–15.5)
MCH: 26.9 pg — ABNORMAL LOW (ref 27.0–33.0)
MCHC: 32.2 g/dL (ref 32.0–36.0)
MCV: 83.4 fL (ref 80.0–100.0)
MPV: 10.8 fL (ref 7.5–12.5)
Platelets: 281 10*3/uL (ref 140–400)
RBC: 4.58 10*6/uL (ref 3.80–5.10)
RDW: 12.9 % (ref 11.0–15.0)
WBC: 5.7 10*3/uL (ref 3.8–10.8)

## 2018-10-09 MED ORDER — BENZONATATE 100 MG PO CAPS: ORAL_CAPSULE | ORAL | 1 refills | Status: DC

## 2018-10-09 NOTE — Patient Instructions (Addendum)
F/U in 6 months, call if you need me before  Congrats on weight loss , keep this up  Use tesalon perles no more than 3 times weekly for choking  Fasting lipid and CBC today [facility]  [facility] give and explain the 3 stool cards , pt requests screening of her colon  It is important that you exercise regularly at least 30 minutes 5 times a week. If you develop chest pain, have severe difficulty breathing, or feel very tired, stop exercising immediately and seek medical attention  .  Please work on good  health habits so that your health will improve. 1. Commitment to daily physical activity for 30 to 60  minutes, if you are able to do this.  2. Commitment to wise food choices. Aim for half of your  food intake to be vegetable and fruit, one quarter starchy foods, and one quarter protein. Try to eat on a regular schedule  3 meals per day, snacking between meals should be limited to vegetables or fruits or small portions of nuts. 64 ounces of water per day is generally recommended, unless you have specific health conditions, like heart failure or kidney failure where you will need to limit fluid intake.  3. Commitment to sufficient and a  good quality of physical and mental rest daily, generally between 6 to 8 hours per day.  WITH PERSISTANCE AND PERSEVERANCE, THE IMPOSSIBLE , BECOMES THE NORM! Thank you  for choosing [facility]. We consider it a privelige to serve you.  Delivering excellent health care in a caring and  compassionate way is our goal.  Partnering with you,  so that together we can achieve this goal is our strategy.

## 2018-10-09 NOTE — Progress Notes (Signed)
   Karen Cox     MRN: [id number]      DOB: 11-11-1960   HPI Ms. Boback is here for follow up and re-evaluation of chronic medical conditions, medication management and review of any available recent lab and radiology data.  Preventive health is updated, specifically  Cancer screening and Immunization.   Has upcoming skin eval at [facility] for concern re hypopigmented rash The PT denies any adverse reactions to current medications since the last visit.  C/o choking , coughing on average 2 times per week, good response to tessalon perles Has changed diet a lot with weight loss , intends to recommit to more regular exercise ROS Denies recent fever or chills. Denies sinus pressure, nasal congestion, ear pain or sore throat. Denies chest congestion, or wheezing. Denies chest pains, palpitations and leg swelling Denies abdominal pain, nausea, vomiting,diarrhea or constipation.   Denies dysuria, frequency, hesitancy or incontinence. Denies joint pain, swelling and limitation in mobility. Denies headaches, seizures, numbness, or tingling. Denies depression,c/o  Anxiety denies  insomnia.  PE  BP 116/70 (BP Location: Right Arm, Patient Position: Sitting, Cuff Size: Large)   Pulse 92   Resp 12   Ht 5\' 5"  (1.651 m)   Wt 214 lb 1.9 oz (97.1 kg)   LMP 06/27/2013   SpO2 99% Comment: room air  BMI 35.63 kg/m   Patient alert and oriented and in no cardiopulmonary distress.  HEENT: No facial asymmetry, EOMI,   oropharynx pink and moist.  Neck supple no JVD, no mass.  Chest: Clear to auscultation bilaterally.  CVS: S1, S2 no murmurs, no S3.Regular rate.  ABD: Soft non tender.   Ext: No edema  MS: Adequate ROM spine, shoulders, hips and knees.  Skin: Intact, no ulcerations  Noted.hypopigmented macular rashes/ eczema  Psych: Good eye contact, normal affect. Memory intact  anxious not  depressed appearing.  CNS: CN 2-12 intact, power,  normal throughout.no focal deficits  noted.   Assessment & Plan  Seasonal allergies Reports increased cough and congestion with good response to tessalon perles, medication sent as requested, she will continue her maintainance therapy as before  Anxiety and depression Managed by Psychiatry and stable and controlled on current medication  Morbid obesity ([place]) Marked improvement, she is applauded on this and encouraged to continue same  Patient re-educated about  the importance of commitment to a  minimum of 150 minutes of exercise per week.  The importance of healthy food choices with portion control discussed. Encouraged to start a food diary, count calories and to consider  joining a support group. Sample diet sheets offered. Goals set by the patient for the next several months.   Weight /BMI 10/09/2018 03/04/2018 09/02/2017  WEIGHT 214 lb 1.9 oz 225 lb 243 lb  HEIGHT 5\' 5"  5\' 4"  5\' 4"   BMI 35.63 kg/m2 38.62 kg/m2 41.71 kg/m2      Hot flashes due to menopause Improved on wellbutrin prescribed by gyne, she will continue same  Gynecomastia Symptomatic with thoracic spine pain, however based on weight has been unable to get surgery, his has been a motivation for her to lose weight over the years

## 2018-10-10 ENCOUNTER — Encounter: Payer: Self-pay | Admitting: Family Medicine

## 2018-10-17 ENCOUNTER — Ambulatory Visit: Payer: Medicaid Other | Admitting: Allergy & Immunology

## 2018-10-23 ENCOUNTER — Telehealth: Payer: Self-pay | Admitting: *Deleted

## 2018-10-23 NOTE — Telephone Encounter (Signed)
She already has an  Upcoming appt at [facility] for dermatology, remind  her  Of that please

## 2018-10-23 NOTE — Telephone Encounter (Signed)
Pt called in wanting a referral to a dermatologist due to hair loss. Stated she had read up on it and she knew there was a dermatologist in [place] that treated hair loss.

## 2018-10-24 NOTE — Telephone Encounter (Signed)
Called pt to remind her of the appt. She said the dermatologist at [facility] does not deal with hair loss. Would like to be referred to the one in [place] as they specialize in hair loss.

## 2018-10-25 ENCOUNTER — Encounter: Payer: Self-pay | Admitting: Family Medicine

## 2018-10-25 NOTE — Assessment & Plan Note (Signed)
Hyperlipidemia:Low fat diet discussed and encouraged.   Lipid Panel  Lab Results  Component Value Date   CHOL 228 (H) 10/09/2018   HDL 54 10/09/2018   LDLCALC 152 (H) 10/09/2018   TRIG 102 10/09/2018   CHOLHDL 4.2 10/09/2018   Uncontrolled, needs to reduce fat in diet

## 2018-10-25 NOTE — Assessment & Plan Note (Signed)
Marked improvement, she is applauded on this and encouraged to continue same  Patient re-educated about  the importance of commitment to a  minimum of 150 minutes of exercise per week.  The importance of healthy food choices with portion control discussed. Encouraged to start a food diary, count calories and to consider  joining a support group. Sample diet sheets offered. Goals set by the patient for the next several months.   Weight /BMI 10/09/2018 03/04/2018 09/02/2017  WEIGHT 214 lb 1.9 oz 225 lb 243 lb  HEIGHT 5\' 5"  5\' 4"  5\' 4"   BMI 35.63 kg/m2 38.62 kg/m2 41.71 kg/m2

## 2018-10-25 NOTE — Telephone Encounter (Signed)
pls refer this pt to Dermatologist that  she is requesting in [facility]? ( first message was [place], pls get name of Doc from pt and verify location and that they accept her ins, after this is completed you may enter referral dx is "hair loss" I will sign , thank you  Pls ask forany  help that you may need

## 2018-10-25 NOTE — Assessment & Plan Note (Signed)
Improved on wellbutrin prescribed by gyne, she will continue same

## 2018-10-25 NOTE — Assessment & Plan Note (Signed)
Managed by Psychiatry and stable and controlled on current medication 

## 2018-10-25 NOTE — Assessment & Plan Note (Signed)
Reports increased cough and congestion with good response to tessalon perles, medication sent as requested, she will continue her maintainance therapy as before

## 2018-10-25 NOTE — Assessment & Plan Note (Signed)
Symptomatic with thoracic spine pain, however based on weight has been unable to get surgery, his has been a motivation for her to lose weight over the years

## 2018-11-07 ENCOUNTER — Ambulatory Visit: Payer: Medicaid Other | Admitting: Allergy

## 2018-11-25 ENCOUNTER — Telehealth: Payer: Self-pay | Admitting: Allergy & Immunology

## 2018-11-25 ENCOUNTER — Telehealth: Payer: Self-pay

## 2018-11-25 NOTE — Telephone Encounter (Signed)
Informed patient that we are unable to prescribe any medications as she has not been seen since 2018. Informed patient she can pick up some OTC eye drops to see if they helps until Friday.

## 2018-11-25 NOTE — Telephone Encounter (Signed)
error 

## 2018-11-25 NOTE — Telephone Encounter (Signed)
Pt called and said that she has a infection in her left eye, it is swolling and itching and waterly . Need some eye drops Psychiatrist. [phone].

## 2018-11-28 ENCOUNTER — Ambulatory Visit: Payer: Self-pay | Admitting: Allergy & Immunology

## 2018-12-22 ENCOUNTER — Telehealth: Payer: Self-pay | Admitting: Family Medicine

## 2018-12-22 NOTE — Telephone Encounter (Signed)
Pt thinks she is having panic attacks at night, would like a nurse to call her back [phone]

## 2018-12-22 NOTE — Telephone Encounter (Signed)
Pt was scheduled for Thursday

## 2018-12-25 ENCOUNTER — Ambulatory Visit: Payer: Medicaid Other | Admitting: Family Medicine

## 2018-12-26 ENCOUNTER — Ambulatory Visit: Payer: Self-pay | Admitting: Allergy & Immunology

## 2019-01-02 ENCOUNTER — Telehealth: Payer: Self-pay | Admitting: Family Medicine

## 2019-01-02 ENCOUNTER — Ambulatory Visit: Payer: Self-pay | Admitting: Allergy & Immunology

## 2019-01-02 NOTE — Telephone Encounter (Signed)
Pt called and lmom for [place] to call her.  I returned call x 2 and no answer

## 2019-02-12 ENCOUNTER — Encounter: Payer: Self-pay | Admitting: Allergy & Immunology

## 2019-02-12 ENCOUNTER — Ambulatory Visit (INDEPENDENT_AMBULATORY_CARE_PROVIDER_SITE_OTHER): Payer: Medicaid Other | Admitting: Allergy & Immunology

## 2019-02-12 ENCOUNTER — Other Ambulatory Visit: Payer: Self-pay

## 2019-02-12 DIAGNOSIS — J3089 Other allergic rhinitis: Secondary | ICD-10-CM | POA: Diagnosis not present

## 2019-02-12 DIAGNOSIS — L501 Idiopathic urticaria: Secondary | ICD-10-CM

## 2019-02-12 MED ORDER — OLOPATADINE HCL 0.7 % OP SOLN: 1.0000 [drp] | OPHTHALMIC | 5 refills | Status: DC

## 2019-02-12 MED ORDER — FLUTICASONE PROPIONATE 50 MCG/ACT NA SUSP: 1.0000 | Freq: Two times a day (BID) | NASAL | 5 refills | Status: DC

## 2019-02-12 MED ORDER — TRIAMCINOLONE ACETONIDE 0.1 % EX OINT: 1.0000 "application " | TOPICAL_OINTMENT | Freq: Two times a day (BID) | CUTANEOUS | 3 refills | Status: DC

## 2019-02-12 NOTE — Progress Notes (Signed)
RE: Karen Cox MRN: [id number] DOB: Oct 01, 1961 Date of Telemedicine Visit: 02/12/2019  Referring provider: Fayrene Helper, MD Primary care provider: Fayrene Helper, MD  Chief Complaint: Cough and Urticaria   Telemedicine Follow Up Visit via Telephone: I connected with Karen Cox for a follow up on 02/12/19 by telephone and verified that I am speaking with the correct person using two identifiers.   I discussed the limitations, risks, security and privacy concerns of performing an evaluation and management service by telephone and the availability of in person appointments. I also discussed with the patient that there may be a patient responsible charge related to this service. The patient expressed understanding and agreed to proceed.  Patient is at home accompanied by herself who provided/contributed to the history.  Provider is at the office.  Visit start time: 2:52 PM Visit end time: 3:17 PM Insurance consent/check in by: [place] consent and medical assistant/nurse: Sharyn Lull  History of Present Illness:  She is a 58 y.o. female, who is being followed for chronic idiopathic urticaria. Her previous allergy office visit was in August 2018 with Dr. Ernst Bowler. At that time, we did a thorough workup to evaluate her history of chronic urticaria as well as chronic rhinitis. 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 We conjectured that this was the source of her urticaria. We put her on suppressive dosing of antihistamines.  Since the last visit, she reports that she has done well. She has controlled mer hives with the current set of medications. She does have some red spots that emerge occasionally. She is wondering whether we can send in a steroid cream. She has used some OTC hydrocortisone without improvement. There are no notable improvement with the hydrocortisone. She denies any triggers to  her urticaria. She denies any worsening of her symptoms with exposure to any foods. She does not have any systemic reactions with these, including wheezing, throat swelling, or abdominal pain  Allergic Rhinitis Symptom History: She reports that her eyes swell up. She does need some eye drops and swelling. She reports that she needs to put a cold rag over it. She is not using a nose spray at all at this point. She does have some sinus infection but typically she does not require any antibiotics.   Otherwise, there have been no changes to her past medical history, surgical history, family history, or social history.  Assessment and Plan:  [name] is a 58 y.o. female with:  Chronic idiopathic urticaria  Perennial allergic rhinitis   1. Chronic urticaria - Previous lab work up has been normal.  - Continue with suppressive dosing of antihistamines:   Zyrtec (cetirizine) 10mg  + Pepcid (famotidine) 20mg  in the morning  Zyrtec (cetirizine) 10mg  + Pepcid famotidine 20mg  at night - We will seen in triamcinolone ointment twice daily on the skin to control the itching.   2. Perennial allergic rhinitis - Start fluticasone one spray per nostril twice daily. - Start Pazeo one drop per eye daily.  3. Return in about 6 months (around 08/15/2019). This can be an in-person follow up visit.   Diagnostics: None.  Medication List:  Current Outpatient Medications  Medication Sig Dispense Refill  . benzonatate (TESSALON) 100 MG capsule Take 1 capsule (100 mg total) by mouth 2 (two) times daily as needed for cough. 30 capsule 0  . buPROPion (WELLBUTRIN XL) 300 MG 24 hr tablet Take 300 mg by mouth every morning.    Marland Kitchen  Cholecalciferol (VITAMIN D3) 125 MCG (5000 UT) CAPS Take by mouth.    . ergocalciferol (VITAMIN D2) 50000 units capsule Take 1 capsule (50,000 Units total) by mouth once a week. One capsule once weekly 12 capsule 3  . estradiol (ESTRACE) 0.1 MG/GM vaginal cream Place vaginally.    Marland Kitchen estrogen,  conjugated,-medroxyprogesterone (PREMPRO) 0.625-2.5 MG tablet Take by mouth.    . fluticasone (FLONASE) 50 MCG/ACT nasal spray Place 1 spray into both nostrils 2 (two) times a day. 16 g 5  . hydrOXYzine (ATARAX/VISTARIL) 50 MG tablet Take 100 mg by mouth 2 (two) times daily.    . Omega-3 Fatty Acids (FISH OIL) 1200 MG CAPS Take by mouth. Takes 2 daily    . Olopatadine HCl (PAZEO) 0.7 % SOLN Place 1 drop into both eyes 1 day or 1 dose. 1 Bottle 5  . triamcinolone ointment (KENALOG) 0.1 % Apply 1 application topically 2 (two) times daily. 80 g 3   Current Facility-Administered Medications  Medication Dose Route Frequency Provider Last Rate Last Dose  . omalizumab Arvid Right) injection 300 mg  300 mg Subcutaneous Q28 days Valentina Shaggy, MD   300 mg at 12/31/17 1035   Allergies: Allergies  Allergen Reactions  . Benadryl  [Diphenhydramine Hcl (Sleep)]   . Benadryl [Diphenhydramine Hcl] Other (See Comments)    Makes congestion worst   . Dextromethorphan Hbr Hives  . Phenol-Glycerin Other (See Comments)    Makes congestion worst.   . Robitussin (Alcohol Free) [Guaifenesin]     Makes cough worse  . Aspirin Rash and Hives    rash  . Diphenhydramine Hcl Hives and Rash    Makes congestion worst   . Latex Rash  . Phenol-Glycerin Rash    Makes congestion worst.    I reviewed her past medical history, social history, family history, and environmental history and no significant changes have been reported from previous visits.  Review of Systems  Constitutional: Negative for activity change and appetite change.  HENT: Negative for congestion, ear discharge, postnasal drip, rhinorrhea, sinus pressure, sneezing and sore throat.   Eyes: Negative for pain, discharge, redness and itching.  Respiratory: Negative for cough, choking, shortness of breath, wheezing and stridor.   Gastrointestinal: Negative for diarrhea, nausea and vomiting.  Musculoskeletal: Positive for joint swelling. Negative for  arthralgias and myalgias.  Skin: Negative for rash.  Allergic/Immunologic: Negative for environmental allergies and food allergies.    Objective:  Physical exam not obtained as encounter was done via telephone.   Previous notes and tests were reviewed.  I discussed the assessment and treatment plan with the patient. The patient was provided an opportunity to ask questions and all were answered. The patient agreed with the plan and demonstrated an understanding of the instructions.   The patient was advised to call back or seek an in-person evaluation if the symptoms worsen or if the condition fails to improve as anticipated.  I provided 25 minutes of non-face-to-face time during this encounter.  It was my pleasure to participate in Lenore Moyano care today. Please feel free to contact me with any questions or concerns.   Sincerely,  Valentina Shaggy, MD

## 2019-02-12 NOTE — Patient Instructions (Addendum)
1. Chronic urticaria - Previous lab work up has been normal.  - Continue with suppressive dosing of antihistamines:   Zyrtec (cetirizine) 10mg  + Pepcid (famotidine) 20mg  in the morning  Zyrtec (cetirizine) 10mg  + Pepcid famotidine 20mg  at night - We will seen in triamcinolone ointment twice daily on the skin to control the itching.   2. Perennial allergic rhinitis - Start fluticasone one spray per nostril twice daily. - Start Pazeo one drop per eye daily.  3. Return in about 6 months (around 08/15/2019). This can be an in-person follow up visit.   Please inform us of any Emergency Department visits, hospitalizations, or changes in symptoms. Call us before going to the ED for breathing or allergy symptoms since we might be able to fit you in for a sick visit. Feel free to contact us anytime with any questions, problems, or concerns.  It was a pleasure to talk to you today today!  Websites that have reliable patient information: 1. American Academy of Asthma, Allergy, and Immunology: www.aaaai.org 2. Food Allergy Research and Education (FARE): foodallergy.org 3. Mothers of Asthmatics: http://www.asthmacommunitynetwork.org 4. American College of Allergy, Asthma, and Immunology: www.acaai.org  "Like" [place] on Facebook and Instagram for our latest updates!      Make sure you are registered to vote! If you have moved or changed any of your contact information, you will need to get this updated before voting!    Voter ID laws are NOT going into effect for the General Election in November 2020! DO NOT let this stop you from exercising your right to vote!

## 2019-02-13 ENCOUNTER — Ambulatory Visit: Payer: Self-pay | Admitting: Allergy & Immunology

## 2019-02-16 ENCOUNTER — Other Ambulatory Visit: Payer: Self-pay | Admitting: Allergy & Immunology

## 2019-02-16 MED ORDER — FAMOTIDINE 20 MG PO TABS: 20.0000 mg | ORAL_TABLET | Freq: Two times a day (BID) | ORAL | 5 refills | Status: DC

## 2019-02-16 NOTE — Telephone Encounter (Signed)
Patient was calling to get her reflux medication sent into [facility]

## 2019-02-16 NOTE — Telephone Encounter (Signed)
Script sent into pharmacy for Pepcid 20 mg twice daily.

## 2019-03-24 ENCOUNTER — Ambulatory Visit (INDEPENDENT_AMBULATORY_CARE_PROVIDER_SITE_OTHER): Payer: Medicaid Other | Admitting: Family Medicine

## 2019-03-24 ENCOUNTER — Other Ambulatory Visit: Payer: Self-pay

## 2019-03-24 ENCOUNTER — Encounter: Payer: Self-pay | Admitting: Family Medicine

## 2019-03-24 VITALS — BP 120/70 | Ht 65.0 in | Wt 200.0 lb

## 2019-03-24 DIAGNOSIS — F329 Major depressive disorder, single episode, unspecified: Secondary | ICD-10-CM

## 2019-03-24 DIAGNOSIS — E785 Hyperlipidemia, unspecified: Secondary | ICD-10-CM | POA: Diagnosis not present

## 2019-03-24 DIAGNOSIS — Z1231 Encounter for screening mammogram for malignant neoplasm of breast: Secondary | ICD-10-CM

## 2019-03-24 DIAGNOSIS — E559 Vitamin D deficiency, unspecified: Secondary | ICD-10-CM

## 2019-03-24 DIAGNOSIS — F419 Anxiety disorder, unspecified: Secondary | ICD-10-CM | POA: Diagnosis not present

## 2019-03-24 DIAGNOSIS — J302 Other seasonal allergic rhinitis: Secondary | ICD-10-CM

## 2019-03-24 MED ORDER — MONTELUKAST SODIUM 10 MG PO TABS: 10.0000 mg | ORAL_TABLET | Freq: Every day | ORAL | 3 refills | Status: DC

## 2019-03-24 NOTE — Assessment & Plan Note (Addendum)
Reports excess sleepiness on current medication , also requests transfer to new Provider, has been at [facility] for over 25 years, advised that records will be reviewed by receiving Provider and a decision made Denies suicidal or homicidal ideation

## 2019-03-24 NOTE — Progress Notes (Signed)
Virtual Visit via Telephone Note  I connected with Karen Cox on 03/24/19 at 11:00 AM EDT by telephone and verified that I am speaking with the correct person using two identifiers.  Location: Patient: home Provider: office   I discussed the limitations, risks, security and privacy concerns of performing an evaluation and management service by telephone and the availability of in person appointments. I also discussed with the patient that there may be a patient responsible charge related to this service. The patient expressed understanding and agreed to proceed. This visit type is conducted due to national recommendations for restrictions regarding the COVID -19 Pandemic. Due to the patient's age and / or co morbidities, this format is felt to be most appropriate at this time without adequate follow up. The patient has no access to video technology/ had technical difficulties with video, requiring transitioning to audio format  only ( telephone ). All issues noted this document were discussed and addressed,no physical exam can be performed in this format.     History of Present Illness: F/u chronic problems C/O weight gain, intends to reverse this  Denies recent fever or chills. C/o increased  sinus pressure and  nasal congestion, denies ear pain or sore throat. Denies chest congestion, productive cough or wheezing. Denies chest pains, palpitations and leg swelling Denies abdominal pain, nausea, vomiting,diarrhea or constipation.   Denies dysuria, frequency, hesitancy or incontinence. Denies joint pain, swelling and limitation in mobility. Denies headaches, seizures, numbness, or tingling. Denies uncontrolled depression, anxiety, however requests referral to new Psychiatrist, states medication for sleep not effective Denies skin break down or rash.       Observations/Objective: BP 120/70   Ht 5\' 5"  (1.651 m)   Wt 200 lb (90.7 kg)   LMP 06/27/2013   BMI 33.28 kg/m   Good communication with no confusion and intact memory. Alert and oriented x 3 No signs of respiratory distress during speech    Assessment and Plan: Anxiety and depression Reports excess sleepiness on current medication , also requests transfer to new Provider, has been at [facility] for over 25 years, advised that records will be reviewed by receiving Provider and a decision made Denies suicidal or homicidal ideation  Seasonal allergies Increased and uncontrolled symptoms, medication added and prescribed, educated to use daily  Hyperlipidemia LDL goal <100 Hyperlipidemia:Low fat diet discussed and encouraged.   Lipid Panel  Lab Results  Component Value Date   CHOL 228 (H) 10/09/2018   HDL 54 10/09/2018   LDLCALC 152 (H) 10/09/2018   TRIG 102 10/09/2018   CHOLHDL 4.2 10/09/2018   Need to reduce fried and fatty foods Updated lab needed at/ before next visit.      Follow Up Instructions:    I discussed the assessment and treatment plan with the patient. The patient was provided an opportunity to ask questions and all were answered. The patient agreed with the plan and demonstrated an understanding of the instructions.   The patient was advised to call back or seek an in-person evaluation if the symptoms worsen or if the condition fails to improve as anticipated.  I provided 22 minutes of non-face-to-face time during this encounter.   [name], MD

## 2019-03-24 NOTE — Patient Instructions (Addendum)
F/U in October, in office, call if you need me sooner  I will refer you to behavioral health, after they review your record , they will determine if they will follow you, so continue with [facility] where you  Have been for over 25 years until you know if you will be a pt there  Please get fasting cmp and eGFr,  TSh and vit D 1 week before your October visit New medication , once daily for allergies  Weight loss goal of 10 pounds  It is important that you exercise regularly at least 30 minutes 5 times a week. If you develop chest pain, have severe difficulty breathing, or feel very tired, stop exercising immediately and seek medical attention  Think about what you will eat, plan ahead. Choose " clean, green, fresh or frozen" over canned, processed or packaged foods which are more sugary, salty and fatty. 70 to 75% of food eaten should be vegetables and fruit. Three meals at set times with snacks allowed between meals, but they must be fruit or vegetables. Aim to eat over a 12 hour period , example 7 am to 7 pm, and STOP after  your last meal of the day. Drink water,generally about 64 ounces per day, no other drink is as healthy. Fruit juice is best enjoyed in a healthy way, by EATING the fruit.

## 2019-03-27 ENCOUNTER — Telehealth: Payer: Self-pay | Admitting: *Deleted

## 2019-03-27 NOTE — Telephone Encounter (Signed)
Pt wanted to now if Dr. Moshe Cipro would send her to for behavioral / psychiatry issues. Said she doesn't want to go anywhere to stay but she wanted to maybe go to [facility] as she had heard about it from a friend

## 2019-03-27 NOTE — Telephone Encounter (Signed)
Spoke with patient and advised her she needs to speak with her psychiatrist regarding mental health placement if she feels this is best for her. She verbalized understanding.

## 2019-03-27 NOTE — Telephone Encounter (Signed)
Pls explain that Karen Cox is in patient , and her Psychiatrist will need to refer her there if she needed to go If she feels she needs to be hospitalized, sj he needs to contact her mental health Provider

## 2019-03-28 NOTE — Assessment & Plan Note (Signed)
Increased and uncontrolled symptoms, medication added and prescribed, educated to use daily

## 2019-03-28 NOTE — Assessment & Plan Note (Signed)
Hyperlipidemia:Low fat diet discussed and encouraged.   Lipid Panel  Lab Results  Component Value Date   CHOL 228 (H) 10/09/2018   HDL 54 10/09/2018   LDLCALC 152 (H) 10/09/2018   TRIG 102 10/09/2018   CHOLHDL 4.2 10/09/2018   Need to reduce fried and fatty foods Updated lab needed at/ before next visit.

## 2019-04-09 ENCOUNTER — Ambulatory Visit: Payer: Medicaid Other | Admitting: Family Medicine

## 2019-05-04 ENCOUNTER — Other Ambulatory Visit: Payer: Self-pay | Admitting: Family Medicine

## 2019-07-06 ENCOUNTER — Ambulatory Visit (HOSPITAL_COMMUNITY): Payer: Medicaid Other

## 2019-07-13 ENCOUNTER — Ambulatory Visit (HOSPITAL_COMMUNITY)
Admission: RE | Admit: 2019-07-13 | Discharge: 2019-07-13 | Disposition: A | Discharge status: Home / Self Care | Payer: Medicaid Other | Source: Ambulatory Visit | Attending: Family Medicine | Admitting: Family Medicine

## 2019-07-13 ENCOUNTER — Other Ambulatory Visit: Payer: Self-pay

## 2019-07-13 DIAGNOSIS — Z1231 Encounter for screening mammogram for malignant neoplasm of breast: Secondary | ICD-10-CM | POA: Insufficient documentation

## 2019-07-15 ENCOUNTER — Other Ambulatory Visit: Payer: Self-pay

## 2019-07-15 MED ORDER — FAMOTIDINE 20 MG PO TABS: 20.0000 mg | ORAL_TABLET | Freq: Two times a day (BID) | ORAL | 0 refills | Status: DC

## 2019-07-28 ENCOUNTER — Telehealth: Payer: Self-pay | Admitting: Allergy & Immunology

## 2019-07-28 NOTE — Telephone Encounter (Signed)
Pt stated she has been going outside without any thing on her arms. She has ben cleaning in the house and picking up rugs also and gets the rash again. She has swelling and itching with redness. She has started coughing. Nothing has bitten her. She believes its a allergy flare up. Pt is doing some itchy relief cream on it. Warm bumps. No antihistamine.

## 2019-07-28 NOTE — Telephone Encounter (Signed)
I feel like I need to see this in person. I will route to [facility] to see if we can get her in tomorrow sometime.  Dee, can you make some magic happen?  Salvatore Marvel, MD [facility] [place] [facility] [place]

## 2019-07-28 NOTE — Telephone Encounter (Signed)
Pt called and said she needs a cream for her rash that she got yesterday outtside. [facility] [phone]// cell [phone].

## 2019-07-28 NOTE — Telephone Encounter (Signed)
Patient couldn't come in due to transportation. She sees her PCP in the morning. I reccommended that she talk to pcp about it until she comes in with [place] on 08-14-2019.  Patient states she didn't think the pcp could help with rash. I encouraged her that they can help with this issue until she can be seen with [place].

## 2019-07-29 ENCOUNTER — Ambulatory Visit: Payer: Medicaid Other | Admitting: Family Medicine

## 2019-07-29 ENCOUNTER — Other Ambulatory Visit: Payer: Self-pay

## 2019-07-29 ENCOUNTER — Encounter: Payer: Self-pay | Admitting: Family Medicine

## 2019-07-29 VITALS — BP 126/90 | HR 105 | Temp 98.0°F | Resp 15 | Ht 65.0 in | Wt 218.0 lb

## 2019-07-29 DIAGNOSIS — E559 Vitamin D deficiency, unspecified: Secondary | ICD-10-CM | POA: Diagnosis not present

## 2019-07-29 DIAGNOSIS — J302 Other seasonal allergic rhinitis: Secondary | ICD-10-CM

## 2019-07-29 DIAGNOSIS — R21 Rash and other nonspecific skin eruption: Secondary | ICD-10-CM

## 2019-07-29 DIAGNOSIS — F419 Anxiety disorder, unspecified: Secondary | ICD-10-CM | POA: Diagnosis not present

## 2019-07-29 DIAGNOSIS — F329 Major depressive disorder, single episode, unspecified: Secondary | ICD-10-CM

## 2019-07-29 LAB — TSH: TSH: 2.2 mIU/L (ref 0.40–4.50)

## 2019-07-29 LAB — VITAMIN D 25 HYDROXY (VIT D DEFICIENCY, FRACTURES): Vit D, 25-Hydroxy: 53 ng/mL (ref 30–100)

## 2019-07-29 LAB — COMPLETE METABOLIC PANEL WITH GFR
AG Ratio: 1.5 (calc) (ref 1.0–2.5)
ALT: 21 U/L (ref 6–29)
AST: 21 U/L (ref 10–35)
Albumin: 4.3 g/dL (ref 3.6–5.1)
Alkaline phosphatase (APISO): 72 U/L (ref 37–153)
BUN: 13 mg/dL (ref 7–25)
CO2: 22 mmol/L (ref 20–32)
Calcium: 9.8 mg/dL (ref 8.6–10.4)
Chloride: 104 mmol/L (ref 98–110)
Creat: 0.82 mg/dL (ref 0.50–1.05)
GFR, Est African American: 92 mL/min/{1.73_m2} (ref >=60)
GFR, Est Non African American: 79 mL/min/{1.73_m2} (ref >=60)
Globulin: 2.8 g/dL (calc) (ref 1.9–3.7)
Glucose, Bld: 112 mg/dL — ABNORMAL HIGH (ref 65–99)
Potassium: 4.6 mmol/L (ref 3.5–5.3)
Sodium: 137 mmol/L (ref 135–146)
Total Bilirubin: 0.8 mg/dL (ref 0.2–1.2)
Total Protein: 7.1 g/dL (ref 6.1–8.1)

## 2019-07-29 MED ORDER — CETIRIZINE HCL 10 MG PO TABS: 10.0000 mg | ORAL_TABLET | Freq: Every day | ORAL | 11 refills | Status: DC

## 2019-07-29 MED ORDER — METHYLPREDNISOLONE ACETATE 80 MG/ML IJ SUSP
80.0000 mg | Freq: Once | INTRAMUSCULAR | Status: AC
  Administered 2019-07-29: 80 mg via INTRAMUSCULAR

## 2019-07-29 MED ORDER — TRIAMCINOLONE ACETONIDE 0.5 % EX CREA: 1.0000 "application " | TOPICAL_CREAM | Freq: Two times a day (BID) | CUTANEOUS | 1 refills | Status: DC

## 2019-07-29 MED ORDER — MONTELUKAST SODIUM 10 MG PO TABS: 10.0000 mg | ORAL_TABLET | Freq: Every day | ORAL | 3 refills | Status: DC

## 2019-07-29 MED ORDER — VITAMIN D (ERGOCALCIFEROL) 1.25 MG (50000 UNIT) PO CAPS: 50000.0000 [IU] | ORAL_CAPSULE | ORAL | 0 refills | Status: DC

## 2019-07-29 NOTE — Telephone Encounter (Signed)
Noted. Thanks!   Jerron Niblack, MD [facility]  

## 2019-07-29 NOTE — Patient Instructions (Signed)
  I appreciate the opportunity to provide you with the care for your health and wellness. Today we discussed: rash and anxiety  Follow up: 1 month   Labs today-please go back to lab and see if they need to draw them, or if they can be added on.  Referral for [facility]  I have sent in a stronger version of the cream for rash. I hope this helps. Prednisone injection today to help get rash under control.  Please pick up medications and take as directed.  Zyrtec is an allergy pill have can help with itching and skin allergies.  Drink plenty of water.   Wash fruits before you eat them.  Please contact your [facility] office and ask about changes to anxiety medications. It seems your anxiety might not be well controlled.  No flu today due to rash. Come back in 2-3 weeks for flu shot if rash is gone.  Please continue to practice social distancing to keep you, your family, and our community safe.  If you must go out, please wear a mask and practice good handwashing.  It was a pleasure to see you and I look forward to continuing to work together on your health and well-being. Please do not hesitate to call the office if you need care or have questions about your care.  Have a wonderful day and week. With Gratitude, Cherly Beach, DNP, AGNP-BC

## 2019-07-29 NOTE — Progress Notes (Signed)
Subjective:     Patient ID: Karen Cox, female   DOB: [birth year], 58 y.o.   MRN: [id number]  Karen Cox presents for Hyperlipidemia (follow up) and Obesity History of anxiety, depression, eczema, GERD, hyperlipidemia among others.  Reports today to discuss hyperlipidemia and obesity.  Reports that she got her lab work right before she came into the office.  Does not want to know what her labs are at this time as she is very nervous about them.  She becomes very anxious when discussing her vital signs as well and her weight.  She has put on 18 pounds since June.  She reports that she has not been eating well.  She is not currently exercising.  Right now she is focused on a rash that has appeared on her arms and legs and back.  She reports that the Kenalog cream is not currently working.  She goes into great detail discussing her allergy issues.  Requests a referral to [facility] dermatology.  Reports she needs Singulair and vitamin D.  Is very quick with her communication and pressured speech.  Has been anxious and hyper activity demeanor today in the office.  She is super focused on the rash even with trying to communicate about hyperlipidemia and her weight.  She reports that she is taking her medications as directed.  Though she reports that she often in the past has stopped taking medications and she knows that she should not do that.  Today patient denies signs and symptoms of COVID 19 infection including fever, chills, cough, shortness of breath, and headache.  Past Medical, Surgical, Social History, Allergies, and Medications have been Reviewed. Past Medical History:  Diagnosis Date  . Allergy   . Anxiety   . Cervical high risk HPV (human papillomavirus) test positive 05/17/2014  . Depression   . Eczema   . GERD (gastroesophageal reflux disease)   . Hot flashes 09/01/2013  . Hyperlipidemia   . Mild mental slowing   . Peri-menopause 08/05/2013  . PMB (postmenopausal  bleeding) 11/16/2013  . Urticaria   . Vaginal itching 08/05/2013  . Yeast infection 08/05/2013   Past Surgical History:  Procedure Laterality Date  . CERVICAL POLYPECTOMY N/A 01/06/2014   Procedure: CERVICAL POLYPECTOMY;  Surgeon: Florian Buff, MD;  Location: [facility] ORS;  Service: Gynecology;  Laterality: N/A;  . COLONOSCOPY  09/19/2012   Procedure: COLONOSCOPY;  Surgeon: Danie Binder, MD;  Location: [facility] ENDO SUITE;  Service: Endoscopy;  Laterality: N/A;  8:30 AM  . DILITATION & CURRETTAGE/HYSTROSCOPY WITH THERMACHOICE ABLATION N/A 01/06/2014   Procedure: DILATATION & CURETTAGE/HYSTEROSCOPY WITH THERMACHOICE ABLATION;  Surgeon: Florian Buff, MD;  Location: [facility] ORS;  Service: Gynecology;  Laterality: N/A;  total therapy time:  9 minutes 12 seconds ,     temperature:87 degrees  . NO PAST SURGERIES     Social History   Socioeconomic History  . Marital status: Single    Spouse name: Not on file  . Number of children: Not on file  . Years of education: Not on file  . Highest education level: Not on file  Occupational History  . Not on file  Social Needs  . Financial resource strain: Not on file  . Food insecurity    Worry: Not on file    Inability: Not on file  . Transportation needs    Medical: Not on file    Non-medical: Not on file  Tobacco Use  . Smoking status: Former Smoker  Packs/day: 0.25    Years: 3.00    Pack years: 0.75    Types: Cigarettes    Quit date: 09/03/1981    Years since quitting: 37.9  . Smokeless tobacco: Never Used  Substance and Sexual Activity  . Alcohol use: No  . Drug use: No  . Sexual activity: Never    Birth control/protection: Post-menopausal  Lifestyle  . Physical activity    Days per week: Not on file    Minutes per session: Not on file  . Stress: Not on file  Relationships  . Social Herbalist on phone: Not on file    Gets together: Not on file    Attends religious service: Not on file    Active member of club or organization:  Not on file    Attends meetings of clubs or organizations: Not on file    Relationship status: Not on file  . Intimate partner violence    Fear of current or ex partner: Not on file    Emotionally abused: Not on file    Physically abused: Not on file    Forced sexual activity: Not on file  Other Topics Concern  . Not on file  Social History Narrative  . Not on file    Outpatient Encounter Medications as of 07/29/2019  Medication Sig  . buPROPion (WELLBUTRIN XL) 300 MG 24 hr tablet Take 300 mg by mouth every morning.  . busPIRone (BUSPAR) 5 MG tablet Take 5 mg by mouth 3 (three) times daily.  Marland Kitchen estradiol (ESTRACE) 0.1 MG/GM vaginal cream Place vaginally.  Marland Kitchen estrogen, conjugated,-medroxyprogesterone (PREMPRO) 0.625-2.5 MG tablet Take by mouth.  . metroNIDAZOLE (FLAGYL) 500 MG tablet Take 500 mg by mouth 3 (three) times daily.  . montelukast (SINGULAIR) 10 MG tablet Take 1 tablet (10 mg total) by mouth at bedtime.  Marland Kitchen OVER THE COUNTER MEDICATION Take 1 capsule by mouth daily. Natural vitamins & minerals formula for healthier hair  . triamcinolone ointment (KENALOG) 0.1 % Apply 1 application topically 2 (two) times daily.  . Vitamin D, Ergocalciferol, (DRISDOL) 1.25 MG (50000 UT) CAPS capsule TAKE 1 CAPSULE THE SAME DAY EACH WEEK.  . Cholecalciferol (VITAMIN D3) 125 MCG (5000 UT) CAPS Take by mouth.  . famotidine (PEPCID) 20 MG tablet Take 1 tablet (20 mg total) by mouth 2 (two) times daily. (Patient not taking: Reported on 07/29/2019)  . hydrOXYzine (ATARAX/VISTARIL) 50 MG tablet Take 100 mg by mouth 2 (two) times daily.  . Olopatadine HCl (PAZEO) 0.7 % SOLN Place 1 drop into both eyes 1 day or 1 dose. (Patient not taking: Reported on 07/29/2019)  . [DISCONTINUED] Omega-3 Fatty Acids (FISH OIL) 1200 MG CAPS Take by mouth. Takes 2 daily   Facility-Administered Encounter Medications as of 07/29/2019  Medication  . omalizumab Arvid Right) injection 300 mg   Allergies  Allergen Reactions  .  Benadryl  [Diphenhydramine Hcl (Sleep)]   . Benadryl [Diphenhydramine Hcl] Other (See Comments)    Makes congestion worst   . Dextromethorphan Hbr Hives  . Phenol-Glycerin Other (See Comments)    Makes congestion worst.   . Robitussin (Alcohol Free) [Guaifenesin]     Makes cough worse  . Aspirin Rash and Hives    rash  . Diphenhydramine Hcl Hives and Rash    Makes congestion worst   . Latex Rash  . Phenol-Glycerin Rash    Makes congestion worst.     Review of Systems  Constitutional: Negative for chills and fever.  HENT:  Negative.   Eyes: Negative.   Respiratory: Negative.   Cardiovascular: Negative.   Gastrointestinal: Negative.   Endocrine: Negative.   Genitourinary: Negative.   Musculoskeletal: Negative.   Skin: Positive for rash.  Allergic/Immunologic: Negative.   Neurological: Negative.   Hematological: Negative.   Psychiatric/Behavioral: The patient is nervous/anxious.   All other systems reviewed and are negative.      Objective:     BP 126/90   Pulse (!) 105   Temp 98 F (36.7 C) (Temporal)   Resp 15   Ht _0  (1.651 m)   Wt 218 lb 0.6 oz (98.9 kg)   LMP 06/27/2013   SpO2 94%   BMI 36.28 kg/m   Physical Exam Vitals signs and nursing note reviewed.  Constitutional:      Appearance: Normal appearance. She is well-developed and well-groomed. She is obese.  HENT:     Head: Normocephalic and atraumatic.     Right Ear: External ear normal.     Left Ear: External ear normal.     Nose: Nose normal.     Mouth/Throat:     Mouth: Mucous membranes are moist.     Pharynx: Oropharynx is clear.  Eyes:     General:        Right eye: No discharge.        Left eye: No discharge.     Conjunctiva/sclera: Conjunctivae normal.  Neck:     Musculoskeletal: Normal range of motion and neck supple.  Cardiovascular:     Rate and Rhythm: Normal rate and regular rhythm.     Pulses: Normal pulses.     Heart sounds: Normal heart sounds.  Pulmonary:     Effort:  Pulmonary effort is normal.     Breath sounds: Normal breath sounds.  Musculoskeletal: Normal range of motion.  Skin:    General: Skin is warm.     Findings: Rash present. Rash is macular and papular.     Comments: Rash: hands, wrist, and arms, legs, chest, face, back. Hive like appearance  Neurological:     General: No focal deficit present.     Mental Status: She is alert and oriented to person, place, and time.  Psychiatric:        Attention and Perception: Attention normal.        Mood and Affect: Mood is anxious and elated.        Speech: Speech is rapid and pressured.        Behavior: Behavior is hyperactive. Behavior is cooperative.        Thought Content: Thought content normal.        Cognition and Memory: Cognition normal.        Judgment: Judgment is impulsive.        Assessment and Plan       1. Seasonal allergies She reports that she has lots of allergies.  Back in the notes there is no demonstrated allergy that she has allergies to.  Dr. Ernst Bowler did extensive testing with her.  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  We will be starting her back on Singulair.  Additionally will be prescribing Zyrtec to help with the symptoms as well. Reviewed side effects, risks and benefits of medication. Patient acknowledged agreement and understanding of the plan.   - montelukast (SINGULAIR) 10 MG tablet; Take 1 tablet (10 mg total) by mouth at bedtime.  Dispense: 30 tablet; Refill: 3  2. Vitamin D deficiency Needs refill provided today Will need  to see if she needs to continue this post labs - Vitamin D, Ergocalciferol, (DRISDOL) 1.25 MG (50000 UT) CAPS capsule; Take 1 capsule (50,000 Units total) by mouth every 7 (seven) days.  Dispense: 12 capsule; Refill: 0  3. Rash Rash is noted throughout and hive-like macular papular appearance.  Today provided her with a Depo-Medrol injection to see if we can reduce the spread and ongoing inflammation.  She reports she has itching  but the cream she has at home is not helping.  Will increase the dosage and see if this helps.  Referral to dermatology was made.  Additionally secondary to an ongoing outbreak will just be rechecking sed rate, CRP, CBC with differential and an ANA to see if anything pops up at this time.  She does report that she sometimes have a rash across her cheeks and nose.  So we will just look back at possible autoimmune rule out.  - cetirizine (ZYRTEC) 10 MG tablet; Take 1 tablet (10 mg total) by mouth daily.  Dispense: 30 tablet; Refill: 11 - Ambulatory referral to Dermatology - methylPREDNISolone acetate (DEPO-MEDROL) injection 80 mg - C-reactive protein - Sed Rate (ESR) - CBC with Differential/Platelet - Antinuclear Antib (ANA) - triamcinolone cream (KENALOG) 0.5 %; Apply 1 application topically 2 (two) times daily.  Dispense: 15 g; Refill: 1   4. Anxiety and depression She has noted pressured speech today, is hyperactive with moving around and communication, impulsive with some of her comments and movements.  Concerned that her anxiety is not well controlled.  [facility] reports that she is taking her Wellbutrin as she supposed to.  Reports that they put her on BuSpar instead of her hydroxyzine because she said she was not taking it like she supposed to but she knows that she needs to take it better.  She has a lot of of stress right now related to her rash and is very hyper focused on that at the moment.  I have encouraged her to speak back to her provider at [facility] to see if there is anything else they can do or suggest to help get her anxiety better controlled.  I think that she will be more compliant with her medications if she had better control over her anxiety.   Follow-up: 09/02/2019  Perlie Mayo, DNP, AGNP-BC [place], [address], [place] [address] Office Hours: Mon-Thurs 8 am-5 pm; Fri 8 am-12 pm Office Phone:   [phone]  Office Fax: [phone]

## 2019-07-31 LAB — CBC WITH DIFFERENTIAL/PLATELET
Absolute Monocytes: 336 cells/uL (ref 200–950)
Basophils Absolute: 11 cells/uL (ref 0–200)
Basophils Relative: 0.1 %
Eosinophils Absolute: 21 cells/uL (ref 15–500)
Eosinophils Relative: 0.2 %
HCT: 42 % (ref 35.0–45.0)
Hemoglobin: 14 g/dL (ref 11.7–15.5)
Lymphs Abs: 1817 cells/uL (ref 850–3900)
MCH: 28.1 pg (ref 27.0–33.0)
MCHC: 33.3 g/dL (ref 32.0–36.0)
MCV: 84.2 fL (ref 80.0–100.0)
MPV: 11 fL (ref 7.5–12.5)
Monocytes Relative: 3.2 %
Neutro Abs: 8316 cells/uL — ABNORMAL HIGH (ref 1500–7800)
Neutrophils Relative %: 79.2 %
Platelets: 305 10*3/uL (ref 140–400)
RBC: 4.99 10*6/uL (ref 3.80–5.10)
RDW: 12.5 % (ref 11.0–15.0)
Total Lymphocyte: 17.3 %
WBC: 10.5 10*3/uL (ref 3.8–10.8)

## 2019-07-31 LAB — ANA: Anti Nuclear Antibody (ANA): POSITIVE — AB

## 2019-07-31 LAB — ANTI-NUCLEAR AB-TITER (ANA TITER): ANA Titer 1: 1:160 {titer} — ABNORMAL HIGH

## 2019-07-31 LAB — C-REACTIVE PROTEIN: CRP: 42.5 mg/L — ABNORMAL HIGH (ref <8.0)

## 2019-07-31 LAB — SEDIMENTATION RATE: Sed Rate: 11 mm/h (ref 0–30)

## 2019-08-04 ENCOUNTER — Other Ambulatory Visit: Payer: Self-pay | Admitting: Family Medicine

## 2019-08-04 DIAGNOSIS — R7982 Elevated C-reactive protein (CRP): Secondary | ICD-10-CM

## 2019-08-04 DIAGNOSIS — R21 Rash and other nonspecific skin eruption: Secondary | ICD-10-CM

## 2019-08-04 NOTE — Progress Notes (Signed)
Please call Karen Cox and let her know that her labs are back.  Please ask how she is doing prior to giving her her labs.  See if her rash has improved any. And let her know that her labs are as follows: She did have some changes in some of the inflammatory markers, 1 of which can be seen and not just autoimmune disorders by infection. She did not have an increase in her WBC but she is on the line where it could change in the next couple days if it has not already.  She also had a rather small increase in her neutrophils which are usually associated with infection. A repeat lab in a week might be a good idea to see if these numbers have resolved.  Especially if she is not feeling any better and her rash is better. Following up with dermatologist is still a good idea.  Possible need for rheumatology if labs do not demonstrate improvement.

## 2019-08-14 ENCOUNTER — Ambulatory Visit: Payer: Self-pay | Admitting: Allergy & Immunology

## 2019-08-21 ENCOUNTER — Telehealth: Payer: Self-pay | Admitting: *Deleted

## 2019-08-21 NOTE — Telephone Encounter (Signed)
Pt wants you to call daughter Caryl Pina when her lab results come back  [phone]

## 2019-08-26 ENCOUNTER — Telehealth: Payer: Self-pay

## 2019-08-26 NOTE — Telephone Encounter (Signed)
Pt daughter is calling again and ask for you to call her asap.  [phone]

## 2019-08-26 NOTE — Telephone Encounter (Signed)
Please call daughter Lorrine Kin RN regarding her mothers ANA lab result.  She said she has called several times and has not been called back.  Abby said she was waiting for additional labs to be drawn but the daughter is asking about the ANA

## 2019-08-26 NOTE — Telephone Encounter (Signed)
The patients daughter wants to discuss patients last labs with you.

## 2019-08-27 ENCOUNTER — Other Ambulatory Visit: Payer: Self-pay

## 2019-08-27 ENCOUNTER — Emergency Department (HOSPITAL_COMMUNITY)
Admission: EM | Admit: 2019-08-27 | Discharge: 2019-08-27 | Disposition: A | Discharge status: Home / Self Care | Payer: Medicaid Other | Attending: Emergency Medicine | Admitting: Emergency Medicine

## 2019-08-27 ENCOUNTER — Other Ambulatory Visit: Payer: Self-pay | Admitting: Family Medicine

## 2019-08-27 ENCOUNTER — Encounter (HOSPITAL_COMMUNITY): Payer: Self-pay | Admitting: *Deleted

## 2019-08-27 ENCOUNTER — Emergency Department (HOSPITAL_COMMUNITY): Payer: Medicaid Other

## 2019-08-27 DIAGNOSIS — Z79899 Other long term (current) drug therapy: Secondary | ICD-10-CM | POA: Insufficient documentation

## 2019-08-27 DIAGNOSIS — R0600 Dyspnea, unspecified: Secondary | ICD-10-CM | POA: Insufficient documentation

## 2019-08-27 DIAGNOSIS — R000 Tachycardia, unspecified: Secondary | ICD-10-CM

## 2019-08-27 DIAGNOSIS — R21 Rash and other nonspecific skin eruption: Secondary | ICD-10-CM

## 2019-08-27 DIAGNOSIS — R002 Palpitations: Secondary | ICD-10-CM | POA: Insufficient documentation

## 2019-08-27 DIAGNOSIS — Z87891 Personal history of nicotine dependence: Secondary | ICD-10-CM | POA: Diagnosis not present

## 2019-08-27 DIAGNOSIS — Z91040 Latex allergy status: Secondary | ICD-10-CM | POA: Diagnosis not present

## 2019-08-27 DIAGNOSIS — R7982 Elevated C-reactive protein (CRP): Secondary | ICD-10-CM

## 2019-08-27 DIAGNOSIS — R768 Other specified abnormal immunological findings in serum: Secondary | ICD-10-CM

## 2019-08-27 LAB — CBC WITH DIFFERENTIAL/PLATELET
Abs Immature Granulocytes: 0.03 10*3/uL (ref 0.00–0.07)
Absolute Monocytes: 454 cells/uL (ref 200–950)
Basophils Absolute: 0 cells/uL (ref 0–200)
Basophils Absolute: 0 10*3/uL (ref 0.0–0.1)
Basophils Relative: 0 %
Basophils Relative: 0 %
Eosinophils Absolute: 94 cells/uL (ref 15–500)
Eosinophils Absolute: 0 10*3/uL (ref 0.0–0.5)
Eosinophils Relative: 1.3 %
Eosinophils Relative: 0 %
HCT: 41.1 % (ref 35.0–45.0)
HCT: 40.8 % (ref 36.0–46.0)
Hemoglobin: 13.4 g/dL (ref 11.7–15.5)
Hemoglobin: 12.9 g/dL (ref 12.0–15.0)
Immature Granulocytes: 0 %
Lymphocytes Relative: 21 %
Lymphs Abs: 2693 cells/uL (ref 850–3900)
Lymphs Abs: 1.7 10*3/uL (ref 0.7–4.0)
MCH: 27.8 pg (ref 27.0–33.0)
MCH: 28 pg (ref 26.0–34.0)
MCHC: 32.6 g/dL (ref 32.0–36.0)
MCHC: 31.6 g/dL (ref 30.0–36.0)
MCV: 85.3 fL (ref 80.0–100.0)
MCV: 88.5 fL (ref 80.0–100.0)
MPV: 10.6 fL (ref 7.5–12.5)
Monocytes Absolute: 0.5 10*3/uL (ref 0.1–1.0)
Monocytes Relative: 6.3 %
Monocytes Relative: 5 %
Neutro Abs: 3960 cells/uL (ref 1500–7800)
Neutro Abs: 6.1 10*3/uL (ref 1.7–7.7)
Neutrophils Relative %: 55 %
Neutrophils Relative %: 74 %
Platelets: 299 10*3/uL (ref 140–400)
Platelets: 285 10*3/uL (ref 150–400)
RBC: 4.82 10*6/uL (ref 3.80–5.10)
RBC: 4.61 MIL/uL (ref 3.87–5.11)
RDW: 12.5 % (ref 11.0–15.0)
RDW: 13.5 % (ref 11.5–15.5)
Total Lymphocyte: 37.4 %
WBC: 7.2 10*3/uL (ref 3.8–10.8)
WBC: 8.3 10*3/uL (ref 4.0–10.5)
nRBC: 0 % (ref 0.0–0.2)

## 2019-08-27 LAB — D-DIMER, QUANTITATIVE: D-Dimer, Quant: 0.39 ug/mL-FEU (ref 0.00–0.50)

## 2019-08-27 LAB — BASIC METABOLIC PANEL
Anion gap: 10 (ref 5–15)
BUN: 13 mg/dL (ref 6–20)
CO2: 19 mmol/L — ABNORMAL LOW (ref 22–32)
Calcium: 9.5 mg/dL (ref 8.9–10.3)
Chloride: 111 mmol/L (ref 98–111)
Creatinine, Ser: 0.88 mg/dL (ref 0.44–1.00)
GFR calc Af Amer: >60 mL/min (ref >60)
GFR calc non Af Amer: >60 mL/min (ref >60)
Glucose, Bld: 113 mg/dL — ABNORMAL HIGH (ref 70–99)
Potassium: 4 mmol/L (ref 3.5–5.1)
Sodium: 140 mmol/L (ref 135–145)

## 2019-08-27 LAB — TSH: TSH: 2.102 u[IU]/mL (ref 0.350–4.500)

## 2019-08-27 LAB — TROPONIN I (HIGH SENSITIVITY): Troponin I (High Sensitivity): 2 ng/L (ref <18)

## 2019-08-27 LAB — HIGH SENSITIVITY CRP: hs-CRP: 9.8 mg/L — ABNORMAL HIGH

## 2019-08-27 NOTE — ED Notes (Signed)
Daughter called to make staff aware that her mother has very high anxiety . Her HR up to 110. And had SOB . Also wanted to make sure we are aware her CRP is elevated

## 2019-08-27 NOTE — Discharge Instructions (Addendum)
Test showed no life-threatening condition.  Recommend follow-up with a cardiologist.  Phone number given.  You will need to call Friday morning for an appointment as soon as they have one available.

## 2019-08-27 NOTE — ED Notes (Signed)
Pt says her brother is going to leave her- pt assisted with disconnection from monitor.Informed pt that Dr has not discharged her yet. Dr Lacinda Axon made aware.

## 2019-08-27 NOTE — ED Provider Notes (Addendum)
[facility] EMERGENCY DEPARTMENT Provider Note   CSN: [id number] Arrival date & time: 08/27/19  1717     History   Chief Complaint Chief Complaint  Patient presents with  . Palpitations    HPI Karen Cox is a 58 y.o. female.     Sensation of tachycardia with a past several days with mild dyspnea.  No substernal chest pain.  No prolonged travel or immobilization.  No history of DVT or PVD.  Seen by her primary care doctor approximately 1 month ago.  This has never happened before.  Severity is moderate.  Nothing makes symptoms better or worse.     Past Medical History:  Diagnosis Date  . Allergy   . Anxiety   . Cervical high risk HPV (human papillomavirus) test positive 05/17/2014  . Depression   . Eczema   . GERD (gastroesophageal reflux disease)   . Hot flashes 09/01/2013  . Hyperlipidemia   . Mild mental slowing   . Peri-menopause 08/05/2013  . PMB (postmenopausal bleeding) 11/16/2013  . Urticaria   . Vaginal itching 08/05/2013  . Yeast infection 08/05/2013    Patient Active Problem List   Diagnosis Date Noted  . Hot flashes due to menopause 03/08/2018  . Vitamin D deficiency 09/06/2017  . Postmenopause 12/16/2014  . Menopause present 08/19/2014  . Dyslipidemia 05/29/2014  . Cervical high risk HPV (human papillomavirus) test positive 05/17/2014  . Endometrial polyp 12/17/2013  . PMB (postmenopausal bleeding) 11/16/2013  . Hormone replacement therapy (HRT) 09/01/2013  . Gynecomastia 01/05/2013  . Anxiety and depression 12/31/2010  . Seasonal allergies 12/31/2010  . Hyperlipidemia LDL goal <100 04/18/2010  . Morbid obesity ([place]) 02/13/2010    Past Surgical History:  Procedure Laterality Date  . CERVICAL POLYPECTOMY N/A 01/06/2014   Procedure: CERVICAL POLYPECTOMY;  Surgeon: Florian Buff, MD;  Location: [facility]S;  Service: Gynecology;  Laterality: N/A;  . COLONOSCOPY  09/19/2012   Procedure: COLONOSCOPY;  Surgeon: Danie Binder, MD;  Location: [facility]  ENDO SUITE;  Service: Endoscopy;  Laterality: N/A;  8:30 AM  . DILITATION & CURRETTAGE/HYSTROSCOPY WITH THERMACHOICE ABLATION N/A 01/06/2014   Procedure: DILATATION & CURETTAGE/HYSTEROSCOPY WITH THERMACHOICE ABLATION;  Surgeon: Florian Buff, MD;  Location: [facility] ORS;  Service: Gynecology;  Laterality: N/A;  total therapy time:  9 minutes 12 seconds ,     temperature:87 degrees  . NO PAST SURGERIES       OB History    Gravida  1   Para  1   Term  1   Preterm      AB      Living  1     SAB      TAB      Ectopic      Multiple      Live Births               Home Medications    Prior to Admission medications   Medication Sig Start Date End Date Taking? Authorizing Provider  buPROPion (WELLBUTRIN XL) 300 MG 24 hr tablet Take 300 mg by mouth every morning. 07/16/18 08/27/19 Yes [provider]  busPIRone (BUSPAR) 5 MG tablet Take 5 mg by mouth 3 (three) times daily. 05/26/19  Yes [provider]  cetirizine (ZYRTEC) 10 MG tablet Take 1 tablet (10 mg total) by mouth daily. 07/29/19  Yes Perlie Mayo, NP  estrogen, conjugated,-medroxyprogesterone (PREMPRO) 0.625-2.5 MG tablet Take 1 tablet by mouth daily.  10/13/18 10/13/19 Yes [provider]  hydrOXYzine (ATARAX/VISTARIL) 50 MG tablet Take 100 mg by mouth 2 (two) times daily.   Yes [provider]  metroNIDAZOLE (FLAGYL) 500 MG tablet Take 500 mg by mouth 3 (three) times daily. 10 day course starting on 08/10/2019   Yes [provider]  montelukast (SINGULAIR) 10 MG tablet Take 1 tablet (10 mg total) by mouth at bedtime. 07/29/19  Yes Perlie Mayo, NP  Vitamin D, Ergocalciferol, (DRISDOL) 1.25 MG (50000 UT) CAPS capsule Take 1 capsule (50,000 Units total) by mouth every 7 (seven) days. 07/29/19  Yes Perlie Mayo, NP  Cholecalciferol (VITAMIN D3) 125 MCG (5000 UT) CAPS Take by mouth.    [provider]  estradiol (ESTRACE) 0.1 MG/GM vaginal cream Place vaginally. 01/27/19  01/27/20  [provider]  famotidine (PEPCID) 20 MG tablet Take 1 tablet (20 mg total) by mouth 2 (two) times daily. Patient not taking: Reported on 07/29/2019 07/15/19   Valentina Shaggy, MD  hydrOXYzine (VISTARIL) 50 MG capsule Take 50 mg by mouth 4 (four) times daily. 07/30/19   [provider]  Olopatadine HCl (PAZEO) 0.7 % SOLN Place 1 drop into both eyes 1 day or 1 dose. Patient not taking: Reported on 07/29/2019 02/12/19   Valentina Shaggy, MD  OVER THE COUNTER MEDICATION Take 1 capsule by mouth daily. Natural vitamins & minerals formula for healthier hair    [provider]  triamcinolone cream (KENALOG) 0.5 % Apply 1 application topically 2 (two) times daily. 07/29/19   Perlie Mayo, NP    Family History Family History  Problem Relation Age of Onset  . Diabetes Father   . Heart disease Father   . Hypertension Father   . Hyperlipidemia Father   . Diabetes Maternal Uncle   . Anemia Maternal Uncle   . Arthritis Maternal Uncle   . Arthritis Maternal Grandmother   . Heart disease Maternal Grandfather        pacemaker  . Colon cancer Neg Hx   . Cancer Neg Hx   . Allergic rhinitis Neg Hx   . Angioedema Neg Hx   . Asthma Neg Hx   . Eczema Neg Hx   . Immunodeficiency Neg Hx   . Urticaria Neg Hx     Social History Social History   Tobacco Use  . Smoking status: Former Smoker    Packs/day: 0.25    Years: 3.00    Pack years: 0.75    Types: Cigarettes    Quit date: 09/03/1981    Years since quitting: 38.0  . Smokeless tobacco: Never Used  Substance Use Topics  . Alcohol use: No  . Drug use: No     Allergies   Benadryl  [diphenhydramine hcl (sleep)], Benadryl [diphenhydramine hcl], Dextromethorphan hbr, Phenol-glycerin, Robitussin (alcohol free) [guaifenesin], Aspirin, Diphenhydramine hcl, Latex, and Phenol-glycerin   Review of Systems Review of Systems  All other systems reviewed and are negative.    Physical Exam Updated  Vital Signs BP (!) 154/89   Pulse (!) 116   Temp 99.2 F (37.3 C) (Oral)   Resp 20   Ht 5\' 5"  (1.651 m)   Wt 90.7 kg   LMP 06/27/2013   SpO2 100%   BMI 33.28 kg/m   Physical Exam Vitals signs and nursing note reviewed.  Constitutional:      Appearance: She is well-developed.     Comments: nad  HENT:     Head: Normocephalic and atraumatic.  Eyes:     Conjunctiva/sclera: Conjunctivae normal.  Neck:     Musculoskeletal: Neck supple.  Cardiovascular:     Rate and Rhythm: Regular rhythm. Tachycardia present.  Pulmonary:     Effort: Pulmonary effort is normal.     Breath sounds: Normal breath sounds.  Abdominal:     General: Bowel sounds are normal.     Palpations: Abdomen is soft.  Musculoskeletal: Normal range of motion.  Skin:    General: Skin is warm and dry.  Neurological:     General: No focal deficit present.     Mental Status: She is alert and oriented to person, place, and time.  Psychiatric:        Behavior: Behavior normal.      ED Treatments / Results  Labs (all labs ordered are listed, but only abnormal results are displayed) Labs Reviewed  BASIC METABOLIC PANEL - Abnormal; Notable for the following components:      Result Value   CO2 19 (*)    Glucose, Bld 113 (*)    All other components within normal limits  CBC WITH DIFFERENTIAL/PLATELET  D-DIMER, QUANTITATIVE (NOT AT [facility])  TSH  TROPONIN I (HIGH SENSITIVITY)    EKG EKG Interpretation  Date/Time:  Thursday August 27 2019 17:29:38 EST Ventricular Rate:  132 PR Interval:  158 QRS Duration: 84 QT Interval:  280 QTC Calculation: 414 R Axis:   8 Text Interpretation: Sinus tachycardia Possible Left atrial enlargement Left ventricular hypertrophy with repolarization abnormality Abnormal ECG Confirmed by Nat Christen [phone]) on 08/27/2019 9:34:30 PM   Radiology Dg Chest Port 1 View  Result Date: 08/27/2019 CLINICAL DATA:  Tachycardia. Palpitations over the last week. Shortness of breath.  EXAM: PORTABLE CHEST 1 VIEW COMPARISON:  12/20/2004 FINDINGS: The heart size and mediastinal contours are within normal limits. Both lungs are clear. The visualized skeletal structures are unremarkable. IMPRESSION: No active disease. Electronically Signed   By: Nelson Chimes M.D.   On: 08/27/2019 20:31    Procedures Procedures (including critical care time)  Medications Ordered in ED Medications - No data to display   Initial Impression / Assessment and Plan / ED Course  I have reviewed the triage vital signs and the nursing notes.  Pertinent labs & imaging results that were available during my care of the patient were reviewed by me and considered in my medical decision making (see chart for details).        Patient presents with unexplained tachycardia.  She is hemodynamically stable.  Basic labs, TSH, D-dimer.  1020: Recheck prior to discharge.  Patient is still tachycardic but cxr, basic labs including D-dimer negative.  Recommend follow-up with cardiology.  Phone number given.  Final Clinical Impressions(s) / ED Diagnoses   Final diagnoses:  Tachycardia    ED Discharge Orders    None       Nat Christen, MD 08/27/19 2029    Nat Christen, MD 08/27/19 2238

## 2019-08-27 NOTE — ED Triage Notes (Signed)
Pt with palpitations for a week, sob at times.  Pt very restless in triage.

## 2019-08-27 NOTE — Progress Notes (Signed)
Discussed with daughter in detail Referral to Rheumatology was placed

## 2019-08-28 ENCOUNTER — Telehealth: Payer: Self-pay | Admitting: Cardiology

## 2019-08-28 ENCOUNTER — Ambulatory Visit (INDEPENDENT_AMBULATORY_CARE_PROVIDER_SITE_OTHER): Payer: Medicaid Other | Admitting: Cardiology

## 2019-08-28 ENCOUNTER — Encounter: Payer: Self-pay | Admitting: Cardiology

## 2019-08-28 VITALS — BP 142/89 | HR 104 | Ht 60.0 in | Wt 207.6 lb

## 2019-08-28 DIAGNOSIS — R000 Tachycardia, unspecified: Secondary | ICD-10-CM

## 2019-08-28 DIAGNOSIS — R002 Palpitations: Secondary | ICD-10-CM

## 2019-08-28 DIAGNOSIS — R0602 Shortness of breath: Secondary | ICD-10-CM | POA: Diagnosis not present

## 2019-08-28 NOTE — Patient Instructions (Signed)
Your physician recommends that you schedule a follow-up appointment in: PENDING WITH DR [facility]  Your physician recommends that you continue on your current medications as directed. Please refer to the Current Medication list given to you today.  Your physician has requested that you have an echocardiogram. Echocardiography is a painless test that uses sound waves to create images of your heart. It provides your doctor with information about the size and shape of your heart and how well your heart's chambers and valves are working. This procedure takes approximately one hour. There are no restrictions for this procedure.  Your physician has recommended that you wear an event monitor FOR 7 DAYS. Event monitors are medical devices that record the heart's electrical activity. Doctors most often [place] these monitors to diagnose arrhythmias. Arrhythmias are problems with the speed or rhythm of the heartbeat. The monitor is a small, portable device. You can wear one while you do your normal daily activities. This is usually used to diagnose what is causing palpitations/syncope (passing out).  Thank you for choosing [facility]!!

## 2019-08-28 NOTE — Progress Notes (Signed)
Clinical Summary Ms. Karen Cox is a 58 y.o.female seen as new patient today for the following medical problems.   1. Sinus tachycardia/Palpitations - recent ER visit with palpitations. EKG shows sinus tach, no arrhythmias noted.  - WBC 8.3 Hgb 12.9 K 4 Cr 0.88 hstrop 2 TSH 2.1 Ddimer neg - had some abnormal inflammatory markers, recent rash. CRP 42.5 ESR 11 ANA + - CXR no acute process  - HR at home 109. Can wake up at night with palpitations -Some SOB with walking.  - no coffee, diet sodas can x 2-3, beer 2-3 days a week.   2. SOB - intermittent episodes at times, sometimes occurs with walking - no cough, no chest pain, no edema.   3. Anxiety - followed by pcp  4. +ANA and CRP - being referred to rheumatologist.     Past Medical History:  Diagnosis Date  . Allergy   . Anxiety   . Cervical high risk HPV (human papillomavirus) test positive 05/17/2014  . Depression   . Eczema   . GERD (gastroesophageal reflux disease)   . Hot flashes 09/01/2013  . Hyperlipidemia   . Mild mental slowing   . Peri-menopause 08/05/2013  . PMB (postmenopausal bleeding) 11/16/2013  . Urticaria   . Vaginal itching 08/05/2013  . Yeast infection 08/05/2013     Allergies  Allergen Reactions  . Benadryl  [Diphenhydramine Hcl (Sleep)]   . Benadryl [Diphenhydramine Hcl] Other (See Comments)    Makes congestion worst   . Dextromethorphan Hbr Hives  . Phenol-Glycerin Other (See Comments)    Makes congestion worst.   . Robitussin (Alcohol Free) [Guaifenesin]     Makes cough worse  . Aspirin Rash and Hives    rash  . Diphenhydramine Hcl Hives and Rash    Makes congestion worst   . Latex Rash  . Phenol-Glycerin Rash    Makes congestion worst.      Current Outpatient Medications  Medication Sig Dispense Refill  . buPROPion (WELLBUTRIN XL) 300 MG 24 hr tablet Take 300 mg by mouth every morning.    . busPIRone (BUSPAR) 5 MG tablet Take 5 mg by mouth 3 (three) times daily.    .  cetirizine (ZYRTEC) 10 MG tablet Take 1 tablet (10 mg total) by mouth daily. 30 tablet 11  . Cholecalciferol (VITAMIN D3) 125 MCG (5000 UT) CAPS Take by mouth.    . estradiol (ESTRACE) 0.1 MG/GM vaginal cream Place vaginally.    Marland Kitchen estrogen, conjugated,-medroxyprogesterone (PREMPRO) 0.625-2.5 MG tablet Take 1 tablet by mouth daily.     . famotidine (PEPCID) 20 MG tablet Take 1 tablet (20 mg total) by mouth 2 (two) times daily. (Patient not taking: Reported on 07/29/2019) 60 tablet 0  . hydrOXYzine (ATARAX/VISTARIL) 50 MG tablet Take 100 mg by mouth 2 (two) times daily.    . hydrOXYzine (VISTARIL) 50 MG capsule Take 50 mg by mouth 4 (four) times daily.    . metroNIDAZOLE (FLAGYL) 500 MG tablet Take 500 mg by mouth 3 (three) times daily. 10 day course starting on 08/10/2019    . montelukast (SINGULAIR) 10 MG tablet Take 1 tablet (10 mg total) by mouth at bedtime. 30 tablet 3  . Olopatadine HCl (PAZEO) 0.7 % SOLN Place 1 drop into both eyes 1 day or 1 dose. (Patient not taking: Reported on 07/29/2019) 1 Bottle 5  . OVER THE COUNTER MEDICATION Take 1 capsule by mouth daily. Natural vitamins & minerals formula for healthier hair    .  triamcinolone cream (KENALOG) 0.5 % Apply 1 application topically 2 (two) times daily. 15 g 1  . Vitamin D, Ergocalciferol, (DRISDOL) 1.25 MG (50000 UT) CAPS capsule Take 1 capsule (50,000 Units total) by mouth every 7 (seven) days. 12 capsule 0   Current Facility-Administered Medications  Medication Dose Route Frequency Provider Last Rate Last Dose  . omalizumab Arvid Right) injection 300 mg  300 mg Subcutaneous Q28 days Karen Shaggy, MD   300 mg at 12/31/17 1035     Past Surgical History:  Procedure Laterality Date  . CERVICAL POLYPECTOMY N/A 01/06/2014   Procedure: CERVICAL POLYPECTOMY;  Surgeon: Florian Buff, MD;  Location: [facility];  Service: Gynecology;  Laterality: N/A;  . COLONOSCOPY  09/19/2012   Procedure: COLONOSCOPY;  Surgeon: Danie Binder, MD;   Location: [facility];  Service: Endoscopy;  Laterality: N/A;  8:30 AM  . DILITATION & CURRETTAGE/HYSTROSCOPY WITH THERMACHOICE ABLATION N/A 01/06/2014   Procedure: DILATATION & CURETTAGE/HYSTEROSCOPY WITH THERMACHOICE ABLATION;  Surgeon: Florian Buff, MD;  Location: [facility];  Service: Gynecology;  Laterality: N/A;  total therapy time:  9 minutes 12 seconds ,     temperature:87 degrees  . NO PAST SURGERIES       Allergies  Allergen Reactions  . Benadryl  [Diphenhydramine Hcl (Sleep)]   . Benadryl [Diphenhydramine Hcl] Other (See Comments)    Makes congestion worst   . Dextromethorphan Hbr Hives  . Phenol-Glycerin Other (See Comments)    Makes congestion worst.   . Robitussin (Alcohol Free) [Guaifenesin]     Makes cough worse  . Aspirin Rash and Hives    rash  . Diphenhydramine Hcl Hives and Rash    Makes congestion worst   . Latex Rash  . Phenol-Glycerin Rash    Makes congestion worst.       Family History  Problem Relation Age of Onset  . Diabetes Father   . Heart disease Father   . Hypertension Father   . Hyperlipidemia Father   . Diabetes Maternal Uncle   . Anemia Maternal Uncle   . Arthritis Maternal Uncle   . Arthritis Maternal Grandmother   . Heart disease Maternal Grandfather        pacemaker  . Colon cancer Neg Hx   . Cancer Neg Hx   . Allergic rhinitis Neg Hx   . Angioedema Neg Hx   . Asthma Neg Hx   . Eczema Neg Hx   . Immunodeficiency Neg Hx   . Urticaria Neg Hx      Social History Ms. Karen Cox reports that she quit smoking about 38 years ago. Her smoking use included cigarettes. She has a 0.75 pack-year smoking history. She has never used smokeless tobacco. Ms. Karen Cox reports no history of alcohol use.   Review of Systems CONSTITUTIONAL: No weight loss, fever, chills, weakness or fatigue.  HEENT: Eyes: No visual loss, blurred vision, double vision or yellow sclerae.No hearing loss, sneezing, congestion, runny nose or sore throat.  SKIN:  No rash or itching.  CARDIOVASCULAR: per hpi RESPIRATORY: No shortness of breath, cough or sputum.  GASTROINTESTINAL: No anorexia, nausea, vomiting or diarrhea. No abdominal pain or blood.  GENITOURINARY: No burning on urination, no polyuria NEUROLOGICAL: No headache, dizziness, syncope, paralysis, ataxia, numbness or tingling in the extremities. No change in bowel or bladder control.  MUSCULOSKELETAL: No muscle, back pain, joint pain or stiffness.  LYMPHATICS: No enlarged nodes. No history of splenectomy.  PSYCHIATRIC: No history of depression or anxiety.  ENDOCRINOLOGIC: No  reports of sweating, cold or heat intolerance. No polyuria or polydipsia.  Marland Kitchen   Physical Examination Today's Vitals   08/28/19 1024  BP: (!) 142/89  Pulse: (!) 104  SpO2: 98%  Weight: 207 lb 9.6 oz (94.2 kg)  Height: 5' (1.524 m)   Body mass index is 40.54 kg/m.  Gen: resting comfortably, no acute distress HEENT: no scleral icterus, pupils equal round and reactive, no palptable cervical adenopathy,  CV: RRR, no mrg, no jvd Resp: Clear to auscultation bilaterally GI: abdomen is soft, non-tender, non-distended, normal bowel sounds, no hepatosplenomegaly MSK: extremities are warm, no edema.  Skin: warm, no rash Neuro:  no focal deficits Psych: appropriate affect     Assessment and Plan   1. Palpitations/Tachycardia - from ER and EKG today in clinic shows sinus tachycardia - from ER workup no signs of anemia, infection, PE, hypoxia, normal TSH, no signs of hypovolemia. She has not acute pain. Unclear if her symptomatic palpitations are just sinus tach or intermittent arrhythmia. She appears quite anxious and this could all be explained by anxiety. She also has elevated inflammatory markers with CRP and ANA, unclear if systemic inflammation/autoimmune process that could be contributing.  - obtain 7 day event monitor to evaluate her palpitations  2. SOB - somewhat nonspecific history. With her tachycardia  and SOB will obtain echo to evaluate for any underlying structrual heart disease  3. +ANA and elevated CRP - from notes she has been referred to rheum  If negative cardiac workup would defer to pcp to workup possible severe anxiety further.      Arnoldo Lenis, M.D.

## 2019-08-28 NOTE — Telephone Encounter (Signed)
°  Precert needed for: Echo   Location: [facility]     Date: Sep 21, 2019

## 2019-08-28 NOTE — Telephone Encounter (Signed)
°  Precert needed for: 7 day event monitor  Location:     Date:

## 2019-09-02 ENCOUNTER — Other Ambulatory Visit: Payer: Self-pay

## 2019-09-02 ENCOUNTER — Ambulatory Visit: Payer: Medicaid Other | Admitting: Family Medicine

## 2019-09-02 ENCOUNTER — Ambulatory Visit: Payer: Self-pay | Admitting: Allergy & Immunology

## 2019-09-08 ENCOUNTER — Ambulatory Visit (INDEPENDENT_AMBULATORY_CARE_PROVIDER_SITE_OTHER): Payer: Medicaid Other | Admitting: Family Medicine

## 2019-09-08 ENCOUNTER — Encounter: Payer: Self-pay | Admitting: Family Medicine

## 2019-09-08 ENCOUNTER — Other Ambulatory Visit: Payer: Self-pay

## 2019-09-08 VITALS — BP 142/89 | Ht 60.0 in | Wt 207.0 lb

## 2019-09-08 DIAGNOSIS — R030 Elevated blood-pressure reading, without diagnosis of hypertension: Secondary | ICD-10-CM

## 2019-09-08 DIAGNOSIS — J302 Other seasonal allergic rhinitis: Secondary | ICD-10-CM

## 2019-09-08 DIAGNOSIS — F411 Generalized anxiety disorder: Secondary | ICD-10-CM | POA: Diagnosis not present

## 2019-09-08 DIAGNOSIS — F339 Major depressive disorder, recurrent, unspecified: Secondary | ICD-10-CM | POA: Diagnosis not present

## 2019-09-08 MED ORDER — BUSPIRONE HCL 5 MG PO TABS: ORAL_TABLET | ORAL | 2 refills | Status: DC

## 2019-09-08 MED ORDER — PAROXETINE HCL 10 MG PO TABS: 10.0000 mg | ORAL_TABLET | Freq: Every day | ORAL | 1 refills | Status: DC

## 2019-09-08 NOTE — Progress Notes (Signed)
Virtual Visit via Telephone Note  I connected with Karen Cox on 09/08/19 at  1:20 PM EST by telephone and verified that I am speaking with the correct person using two identifiers.  Location: Patient: home Provider: office   I discussed the limitations, risks, security and privacy concerns of performing an evaluation and management service by telephone and the availability of in person appointments. I also discussed with the patient that there may be a patient responsible charge related to this service. The patient expressed understanding and agreed to proceed.   History of Present Illness: C/o increased anxiety and panic especially since getting abnormal lab results and being referred to different Providers. Now no longer wants to keep appts because she is scred , she staes.Has found out of town Psych, was being seen locally for years, not suicidal or homicidal , but anxiet disabling Still working on weight loss, however has been recently gaining due to home bound stressful situation Denies recent fever or chills. Denies sinus pressure, nasal congestion, ear pain or sore throat. Denies chest congestion, productive cough or wheezing. Denies chest pains, palpitations and leg swelling Denies depression, anxiety or insomnia. Denies skin break down or rash.       Observations/Objective: BP (!) 142/89   Ht 5' (1.524 m)   Wt 207 lb (93.9 kg)   LMP 06/27/2013   BMI 40.43 kg/m  Good communication with no confusion and intact memory. Alert and oriented x 3 No signs of respiratory distress during speech    Assessment and Plan: GAD (generalized anxiety disorder) Increased anxiety with panic, start twice daily buspar and behavioral techniques to manage panic also discussed  Morbid obesity (Karen Cox)  Patient re-educated about  the importance of commitment to a  minimum of 150 minutes of exercise per week as able.  The importance of healthy food choices with portion control  discussed, as well as eating regularly and within a 12 hour window most days. The need to choose "clean , green" food 50 to 75% of the time is discussed, as well as to make water the primary drink and set a goal of 64 ounces water daily.    Weight /BMI 09/08/2019 08/28/2019 08/27/2019  WEIGHT 207 lb 207 lb 9.6 oz 200 lb  HEIGHT 5\' 0"  5\' 0"  5\' 5"   BMI 40.43 kg/m2 40.54 kg/m2 33.28 kg/m2      Seasonal allergies Controlled, no change in medication   Elevated blood-pressure reading without diagnosis of hypertension noterd from pt record, will need office eval DASH diet and commitment to daily physical activity for a minimum of 30 minutes discussed and encouraged, as a part of hypertension management. The importance of attaining a healthy weight is also discussed.  BP/Weight 09/08/2019 08/28/2019 08/27/2019 07/29/2019 03/24/2019 10/09/2018 [id number]  Systolic BP A999333 [id number] [id number]  Diastolic BP 89 89 89 90 70 70 74  Wt. (Lbs) 207 207.6 200 218.04 200 214.12 225  BMI 40.43 40.54 33.28 36.28 33.28 35.63 38.62         Follow Up Instructions:    I discussed the assessment and treatment plan with the patient. The patient was provided an opportunity to ask questions and all were answered. The patient agreed with the plan and demonstrated an understanding of the instructions.   The patient was advised to call back or seek an in-person evaluation if the symptoms worsen or if the condition fails to improve as anticipated.  I provided 25 minutes of non-face-to-face time during this  encounter.   [name], MD

## 2019-09-08 NOTE — Patient Instructions (Signed)
F/U telephone visit with MD in 5 weeks, call if you need me before  New for anxiety  And depression, are paxil and buspar, continue all  Current medications as before  It is important that you exercise regularly at least 30 minutes 5 times a week. If you develop chest pain, have severe difficulty breathing, or feel very tired, stop exercising immediately and seek medical attention    Think about what you will eat, plan ahead. Choose " clean, green, fresh or frozen" over canned, processed or packaged foods which are more sugary, salty and fatty. 70 to 75% of food eaten should be vegetables and fruit. Three meals at set times with snacks allowed between meals, but they must be fruit or vegetables. Aim to eat over a 12 hour period , example 7 am to 7 pm, and STOP after  your last meal of the day. Drink water,generally about 64 ounces per day, no other drink is as healthy. Fruit juice is best enjoyed in a healthy way, by EATING the fruit.  Thanks for choosing [facility], we consider it a privelige to serve you.

## 2019-09-10 ENCOUNTER — Telehealth: Payer: Self-pay | Admitting: Cardiology

## 2019-09-10 NOTE — Telephone Encounter (Signed)
Per phone call from daughter Caryl Pina - Pt is having a hard time getting her monitor on - would like someone to please put this on her.   Please give pt a call @ [phone] to let her when she can come in.

## 2019-09-10 NOTE — Telephone Encounter (Signed)
Have already talked to this pt and scheduled pt to come in tomorrow at 930am - pt could not come in today as originally requested

## 2019-09-11 ENCOUNTER — Other Ambulatory Visit: Payer: Self-pay

## 2019-09-11 ENCOUNTER — Ambulatory Visit: Payer: Medicaid Other

## 2019-09-11 ENCOUNTER — Ambulatory Visit: Payer: Self-pay | Admitting: Allergy & Immunology

## 2019-09-11 ENCOUNTER — Encounter (INDEPENDENT_AMBULATORY_CARE_PROVIDER_SITE_OTHER): Payer: Medicaid Other

## 2019-09-11 DIAGNOSIS — R002 Palpitations: Secondary | ICD-10-CM | POA: Diagnosis not present

## 2019-09-13 ENCOUNTER — Encounter: Payer: Self-pay | Admitting: Family Medicine

## 2019-09-13 DIAGNOSIS — F321 Major depressive disorder, single episode, moderate: Secondary | ICD-10-CM | POA: Insufficient documentation

## 2019-09-13 DIAGNOSIS — F411 Generalized anxiety disorder: Secondary | ICD-10-CM | POA: Insufficient documentation

## 2019-09-13 DIAGNOSIS — F339 Major depressive disorder, recurrent, unspecified: Secondary | ICD-10-CM | POA: Insufficient documentation

## 2019-09-13 DIAGNOSIS — R030 Elevated blood-pressure reading, without diagnosis of hypertension: Secondary | ICD-10-CM | POA: Insufficient documentation

## 2019-09-13 NOTE — Assessment & Plan Note (Signed)
  Patient re-educated about  the importance of commitment to a  minimum of 150 minutes of exercise per week as able.  The importance of healthy food choices with portion control discussed, as well as eating regularly and within a 12 hour window most days. The need to choose "clean , green" food 50 to 75% of the time is discussed, as well as to make water the primary drink and set a goal of 64 ounces water daily.    Weight /BMI 09/08/2019 08/28/2019 08/27/2019  WEIGHT 207 lb 207 lb 9.6 oz 200 lb  HEIGHT 5\' 0"  5\' 0"  5\' 5"   BMI 40.43 kg/m2 40.54 kg/m2 33.28 kg/m2

## 2019-09-13 NOTE — Assessment & Plan Note (Signed)
Increased anxiety with panic, start twice daily buspar and behavioral techniques to manage panic also discussed

## 2019-09-13 NOTE — Assessment & Plan Note (Signed)
noterd from pt record, will need office eval DASH diet and commitment to daily physical activity for a minimum of 30 minutes discussed and encouraged, as a part of hypertension management. The importance of attaining a healthy weight is also discussed.  BP/Weight 09/08/2019 08/28/2019 08/27/2019 07/29/2019 03/24/2019 10/09/2018 [id number]  Systolic BP A999333 [id number] [id number] [id number]  Diastolic BP 89 89 89 90 70 70 74  Wt. (Lbs) 207 207.6 200 218.04 200 214.12 225  BMI 40.43 40.54 33.28 36.28 33.28 35.63 38.62

## 2019-09-13 NOTE — Assessment & Plan Note (Signed)
Controlled, no change in medication  

## 2019-09-14 NOTE — Telephone Encounter (Signed)
Patient asking for help with places sticky pads for monitor

## 2019-09-14 NOTE — Telephone Encounter (Signed)
Patient has arranged to have her monitor placed by [place] on Friday this week per Vicky.

## 2019-09-18 ENCOUNTER — Ambulatory Visit: Payer: Medicaid Other | Admitting: Allergy & Immunology

## 2019-09-18 ENCOUNTER — Ambulatory Visit: Payer: Medicaid Other

## 2019-09-21 ENCOUNTER — Other Ambulatory Visit: Payer: Medicaid Other

## 2019-09-30 ENCOUNTER — Other Ambulatory Visit: Payer: Medicaid Other

## 2019-09-30 ENCOUNTER — Ambulatory Visit (INDEPENDENT_AMBULATORY_CARE_PROVIDER_SITE_OTHER): Payer: Medicaid Other

## 2019-09-30 ENCOUNTER — Other Ambulatory Visit: Payer: Self-pay

## 2019-09-30 DIAGNOSIS — R0602 Shortness of breath: Secondary | ICD-10-CM

## 2019-10-06 ENCOUNTER — Other Ambulatory Visit: Payer: Medicaid Other

## 2019-10-07 ENCOUNTER — Other Ambulatory Visit: Payer: Self-pay

## 2019-10-07 ENCOUNTER — Encounter: Payer: Self-pay | Admitting: Allergy & Immunology

## 2019-10-07 ENCOUNTER — Ambulatory Visit (INDEPENDENT_AMBULATORY_CARE_PROVIDER_SITE_OTHER): Payer: Medicaid Other | Admitting: Allergy & Immunology

## 2019-10-07 DIAGNOSIS — L501 Idiopathic urticaria: Secondary | ICD-10-CM | POA: Diagnosis not present

## 2019-10-07 DIAGNOSIS — J3089 Other allergic rhinitis: Secondary | ICD-10-CM | POA: Diagnosis not present

## 2019-10-07 MED ORDER — MONTELUKAST SODIUM 10 MG PO TABS: 10.0000 mg | ORAL_TABLET | Freq: Every day | ORAL | 3 refills | Status: DC

## 2019-10-07 MED ORDER — FLUTICASONE PROPIONATE 50 MCG/ACT NA SUSP: 1.0000 | Freq: Every day | NASAL | 5 refills | Status: DC

## 2019-10-07 MED ORDER — FAMOTIDINE 20 MG PO TABS: 20.0000 mg | ORAL_TABLET | Freq: Every day | ORAL | 5 refills | Status: DC

## 2019-10-07 MED ORDER — PAZEO 0.7 % OP SOLN: 1.0000 [drp] | OPHTHALMIC | 5 refills | Status: DC

## 2019-10-07 MED ORDER — CETIRIZINE HCL 10 MG PO TABS: 10.0000 mg | ORAL_TABLET | Freq: Two times a day (BID) | ORAL | 5 refills | Status: DC

## 2019-10-07 NOTE — Progress Notes (Signed)
RE: Karen Cox MRN: [id number] DOB: 1961-08-12 Date of Telemedicine Visit: 10/07/2019  Referring provider: Fayrene Helper, MD Primary care provider: Fayrene Helper, MD  Chief Complaint: Urticaria (No complaints. No new episodes of hives. )   Telemedicine Follow Up Visit via Telephone: I connected with Karen Cox for a follow up on 10/07/19 by telephone and verified that I am speaking with the correct person using two identifiers.   I discussed the limitations, risks, security and privacy concerns of performing an evaluation and management service by telephone and the availability of in person appointments. I also discussed with the patient that there may be a patient responsible charge related to this service. The patient expressed understanding and agreed to proceed.  Patient is at home.  Provider is at the office.  Visit start time: 11:03 AM Visit end time: 11:22 AM Insurance consent/check in by: [facility] Medical consent and medical assistant/nurse: Olivia Mackie  History of Present Illness:  She is a 58 y.o. female, who is being followed for chronic urticaria and allergic rhinitis. Her previous allergy office visit was in May 2020 with myself. At that time, continued with cetirizine and Pepcid BID. We started with fluticasone and Pazeo daily as needed.  Since the last visit, she has done well. She did have an urticarial outbreak in November, which was treated with a Depo-Medrol injection. This was given by the the NP [facility].  This cleared up the hives very quickly.  She does not know what triggered this episode.  She was eating nothing out of the ordinary.    She remains on the cetirizine twice daily. She is not using the famotidine at all. She is not having any regular episodes of hives or swelling.  Aside from the episode in November, she has not had reactions in quite some time.  She does have an up-to-date EpiPen.  There have been no food exposures that are out  of the ordinary. She has not needed to go to the emergency room.  Her nasal symptoms have been well controlled.  She is not using her nasal spray on a routine basis, but is open to trying it to help with postnasal drip.  She does report some throat clearing and a cough.  This is been ongoing for several weeks now.  She has not tried any over-the-counter cough medicines.  She has had no fever, congestion, or loss of smell or taste.  Otherwise, there have been no changes to her past medical history, surgical history, family history, or social history.  Assessment and Plan:  Karen Cox is a 58 y.o. female with:  Chronic idiopathic urticaria  Perennial allergic rhinitis   Karen Cox presents for follow-up visit.  We are going to continue her on suppressive doses of antihistamines.  I do not think Xolair is indicated at this point since her episodes of hives and swelling are fairly few and far between.  She is still worried that there is an allergic trigger to her symptoms.  She has not had prick testing since Dr. Ishmael Holter was here, and is interested in updating that.  We will schedule her for skin testing to see if there are any environmental triggers of her symptoms.  The hives seem to be her most concerning symptom at this point.    Diagnostics: None.  Medication List:  Current Outpatient Medications  Medication Sig Dispense Refill  . buPROPion (WELLBUTRIN XL) 300 MG 24 hr tablet Take 300 mg by mouth every morning.    Marland Kitchen  busPIRone (BUSPAR) 5 MG tablet Take one tablet at bedtime for anxiety 30 tablet 2  . cetirizine (ZYRTEC) 10 MG tablet Take 1 tablet (10 mg total) by mouth daily. 30 tablet 11  . Cholecalciferol (VITAMIN D3) 125 MCG (5000 UT) CAPS Take by mouth.    . estrogen, conjugated,-medroxyprogesterone (PREMPRO) 0.625-2.5 MG tablet Take 1 tablet by mouth daily.     . famotidine (PEPCID) 20 MG tablet Take 20 mg by mouth daily.    . hydrOXYzine (ATARAX/VISTARIL) 50 MG tablet Take 100 mg by mouth  2 (two) times daily.    . hydrOXYzine (VISTARIL) 50 MG capsule Take 50 mg by mouth 4 (four) times daily.    . montelukast (SINGULAIR) 10 MG tablet Take 1 tablet (10 mg total) by mouth at bedtime. 30 tablet 3  . Olopatadine HCl (PAZEO) 0.7 % SOLN Place 1 drop into both eyes 1 day or 1 dose. 1 Bottle 5  . OVER THE COUNTER MEDICATION Take 1 capsule by mouth daily. Natural vitamins & minerals formula for healthier hair    . PARoxetine (PAXIL) 10 MG tablet Take 1 tablet (10 mg total) by mouth daily. 30 tablet 1  . triamcinolone cream (KENALOG) 0.5 % Apply 1 application topically 2 (two) times daily. 15 g 1  . Vitamin D, Ergocalciferol, (DRISDOL) 1.25 MG (50000 UT) CAPS capsule Take 1 capsule (50,000 Units total) by mouth every 7 (seven) days. 12 capsule 0   Current Facility-Administered Medications  Medication Dose Route Frequency Provider Last Rate Last Admin  . omalizumab Arvid Right) injection 300 mg  300 mg Subcutaneous Q28 days Valentina Shaggy, MD   300 mg at 12/31/17 1035   Allergies: Allergies  Allergen Reactions  . Benadryl  [Diphenhydramine Hcl (Sleep)]   . Benadryl [Diphenhydramine Hcl] Other (See Comments)    Makes congestion worst   . Dextromethorphan Hbr Hives  . Phenol-Glycerin Other (See Comments)    Makes congestion worst.   . Robitussin (Alcohol Free) [Guaifenesin]     Makes cough worse  . Aspirin Rash and Hives    rash  . Diphenhydramine Hcl Hives and Rash    Makes congestion worst   . Latex Rash  . Phenol-Glycerin Rash    Makes congestion worst.    I reviewed her past medical history, social history, family history, and environmental history and no significant changes have been reported from previous visits.  Review of Systems  Constitutional: Negative for activity change, appetite change, chills, diaphoresis, fatigue and fever.  HENT: Negative for congestion, postnasal drip, rhinorrhea, sinus pressure and sore throat.   Eyes: Negative for pain, discharge, redness  and itching.  Respiratory: Negative for shortness of breath, wheezing and stridor.   Gastrointestinal: Negative for diarrhea, nausea and vomiting.  Endocrine: Negative for cold intolerance and heat intolerance.  Musculoskeletal: Negative for arthralgias, joint swelling and myalgias.  Skin: Positive for rash.  Allergic/Immunologic: Positive for environmental allergies. Negative for food allergies.    Objective:  Physical exam not obtained as encounter was done via telephone.   Previous notes and tests were reviewed.  I discussed the assessment and treatment plan with the patient. The patient was provided an opportunity to ask questions and all were answered. The patient agreed with the plan and demonstrated an understanding of the instructions.   The patient was advised to call back or seek an in-person evaluation if the symptoms worsen or if the condition fails to improve as anticipated.  I provided 18 minutes of non-face-to-face time during this  encounter.  It was my pleasure to participate in Karen Cox care today. Please feel free to contact me with any questions or concerns.   Sincerely,  Valentina Shaggy, MD

## 2019-10-12 ENCOUNTER — Ambulatory Visit: Payer: Medicaid Other | Admitting: Family Medicine

## 2019-10-12 ENCOUNTER — Telehealth: Payer: Self-pay | Admitting: *Deleted

## 2019-10-12 NOTE — Telephone Encounter (Signed)
-----   Message from Massie Maroon, [place] sent at 10/12/2019 10:07 AM EST -----  ----- Message ----- From: Massie Maroon, CMA Sent: 10/12/2019  10:07 AM EST To: Laurine Blazer, LPN   ----- Message ----- From: Herminio Commons, MD Sent: 09/30/2019  11:44 AM EST To: Laurine Blazer, LPN  Normal pumping function.

## 2019-10-12 NOTE — Telephone Encounter (Signed)
LM for daughter Caryl Pina per pt request

## 2019-10-13 ENCOUNTER — Encounter: Payer: Self-pay | Admitting: Family Medicine

## 2019-10-13 ENCOUNTER — Ambulatory Visit (INDEPENDENT_AMBULATORY_CARE_PROVIDER_SITE_OTHER): Payer: Medicaid Other | Admitting: Family Medicine

## 2019-10-13 ENCOUNTER — Other Ambulatory Visit: Payer: Self-pay

## 2019-10-13 VITALS — BP 142/89 | Ht 60.0 in | Wt 207.0 lb

## 2019-10-13 DIAGNOSIS — F329 Major depressive disorder, single episode, unspecified: Secondary | ICD-10-CM

## 2019-10-13 DIAGNOSIS — F5104 Psychophysiologic insomnia: Secondary | ICD-10-CM | POA: Diagnosis not present

## 2019-10-13 DIAGNOSIS — G4700 Insomnia, unspecified: Secondary | ICD-10-CM | POA: Insufficient documentation

## 2019-10-13 DIAGNOSIS — R030 Elevated blood-pressure reading, without diagnosis of hypertension: Secondary | ICD-10-CM

## 2019-10-13 DIAGNOSIS — F3341 Major depressive disorder, recurrent, in partial remission: Secondary | ICD-10-CM

## 2019-10-13 DIAGNOSIS — F32A Depression, unspecified: Secondary | ICD-10-CM

## 2019-10-13 DIAGNOSIS — J302 Other seasonal allergic rhinitis: Secondary | ICD-10-CM

## 2019-10-13 DIAGNOSIS — F419 Anxiety disorder, unspecified: Secondary | ICD-10-CM | POA: Diagnosis not present

## 2019-10-13 MED ORDER — PAROXETINE HCL 20 MG PO TABS: 20.0000 mg | ORAL_TABLET | Freq: Every day | ORAL | 3 refills | Status: DC

## 2019-10-13 NOTE — Assessment & Plan Note (Signed)
Needs in office evaluation to determine medication management DASH diet and commitment to daily physical activity for a minimum of 30 minutes discussed and encouraged, as a part of hypertension management. The importance of attaining a healthy weight is also discussed.  BP/Weight 10/13/2019 09/08/2019 08/28/2019 08/27/2019 07/29/2019 [id number]  Systolic BP A999333 A999333 A999333 [id number]  Diastolic BP 89 89 89 89 90 70 70  Wt. (Lbs) 207 207 207.6 200 218.04 200 214.12  BMI 40.43 40.43 40.54 33.28 36.28 33.28 35.63

## 2019-10-13 NOTE — Assessment & Plan Note (Signed)
Controlled, no change in medication  

## 2019-10-13 NOTE — Patient Instructions (Addendum)
F/u phone visit with MD in 8 to 10 weeks , re evaluate anxiety and depression  Increase dose of paxil to 20 mg daily, continue other medications as before  No caffeine please , and practice calming habits at bedtime  It is important that you exercise regularly at least 30 minutes 5 times a week. If you develop chest pain, have severe difficulty breathing, or feel very tired, stop exercising immediately and seek medical attention   Think about what you will eat, plan ahead. Choose " clean, green, fresh or frozen" over canned, processed or packaged foods which are more sugary, salty and fatty. 70 to 75% of food eaten should be vegetables and fruit. Three meals at set times with snacks allowed between meals, but they must be fruit or vegetables. Aim to eat over a 12 hour period , example 7 am to 7 pm, and STOP after  your last meal of the day. Drink water,generally about 64 ounces per day, no other drink is as healthy. Fruit juice is best enjoyed in a healthy way, by EATING the fruit.   Thanks for choosing [facility], we consider it a privelige to serve you.

## 2019-10-13 NOTE — Assessment & Plan Note (Addendum)
Inadequate control, needs Psych. Increase paxil to 20 mg daily, encouraged pt to start mx through Psych asap

## 2019-10-13 NOTE — Progress Notes (Signed)
Virtual Visit via Telephone Note  I connected with Karen Cox on 10/13/19 at  2:20 PM EST by telephone and verified that I am speaking with the correct person using two identifiers.  Location: Patient: home Provider: office   I discussed the limitations, risks, security and privacy concerns of performing an evaluation and management service by telephone and the availability of in person appointments. I also discussed with the patient that there may be a patient responsible charge related to this service. The patient expressed understanding and agreed to proceed.   History of Present Illness: F/u anxiety and depression.  Reports improvement in symptoms, but is still reporting uncontrolled anxiety as well as depression, denies suicidal or homicidal ideation. Needs to schedule wit Psychiatry, requesting benzo for anxiety, I advise this will need to be through Psychiatry. C/o inadequate sleep, trying Unisom from pharmacist , and states will try melatonin if ineffective. Not exercising as much as desired which she attributes to weather and to covid  Observations/Objective: BP (!) 142/89   Ht 5' (1.524 m)   Wt 207 lb (93.9 kg)   LMP 06/27/2013   BMI 40.43 kg/m    Assessment and Plan: Elevated blood-pressure reading without diagnosis of hypertension Needs in office evaluation to determine medication management DASH diet and commitment to daily physical activity for a minimum of 30 minutes discussed and encouraged, as a part of hypertension management. The importance of attaining a healthy weight is also discussed.  BP/Weight 10/13/2019 09/08/2019 08/28/2019 08/27/2019 07/29/2019 [id number]  Systolic BP A999333 A999333 A999333 [id number]  Diastolic BP 89 89 89 89 90 70 70  Wt. (Lbs) 207 207 207.6 200 218.04 200 214.12  BMI 40.43 40.43 40.54 33.28 36.28 33.28 35.63       Morbid obesity (HCC)  Patient re-educated about  the importance of commitment to a  minimum of 150 minutes  of exercise per week as able.  The importance of healthy food choices with portion control discussed, as well as eating regularly and within a 12 hour window most days. The need to choose "clean , green" food 50 to 75% of the time is discussed, as well as to make water the primary drink and set a goal of 64 ounces water daily.    Weight /BMI 10/13/2019 09/08/2019 08/28/2019  WEIGHT 207 lb 207 lb 207 lb 9.6 oz  HEIGHT 5\' 0"  5\' 0"  5\' 0"   BMI 40.43 kg/m2 40.43 kg/m2 40.54 kg/m2      Anxiety and depression Inadequate control, needs Psych. Increase paxil to 20 mg daily, encouraged pt to start mx through Psych asap  Depression, recurrent (HCC) Uncontrolled, not suicidal or homicidal, inc paxil to 20 mg daily an encouraged to establish with Psych  Seasonal allergies Controlled, no change in medication   Insomnia Sleep hygiene reviewed and written information offered also. .      Follow Up Instructions:    I discussed the assessment and treatment plan with the patient. The patient was provided an opportunity to ask questions and all were answered. The patient agreed with the plan and demonstrated an understanding of the instructions.   The patient was advised to call back or seek an in-person evaluation if the symptoms worsen or if the condition fails to improve as anticipated.  I provided 21 minutes of non-face-to-face time during this encounter.   [name], MD

## 2019-10-13 NOTE — Assessment & Plan Note (Signed)
Sleep hygiene reviewed and written information offered also. ?. ? ?

## 2019-10-13 NOTE — Assessment & Plan Note (Signed)
Uncontrolled, not suicidal or homicidal, inc paxil to 20 mg daily an encouraged to establish with Psych

## 2019-10-13 NOTE — Assessment & Plan Note (Signed)
  Patient re-educated about  the importance of commitment to a  minimum of 150 minutes of exercise per week as able.  The importance of healthy food choices with portion control discussed, as well as eating regularly and within a 12 hour window most days. The need to choose "clean , green" food 50 to 75% of the time is discussed, as well as to make water the primary drink and set a goal of 64 ounces water daily.    Weight /BMI 10/13/2019 09/08/2019 08/28/2019  WEIGHT 207 lb 207 lb 207 lb 9.6 oz  HEIGHT 5\' 0"  5\' 0"  5\' 0"   BMI 40.43 kg/m2 40.43 kg/m2 40.54 kg/m2

## 2019-10-14 NOTE — Telephone Encounter (Signed)
Patient returned call to [facility].  Is she suppose to be coming back for a follow up.  Requested to call her Cell # [phone].

## 2019-10-14 NOTE — Telephone Encounter (Signed)
-----   Message from Massie Maroon, Quinn sent at 10/12/2019 10:07 AM EST -----  ----- Message ----- From: Massie Maroon, CMA Sent: 10/12/2019  10:07 AM EST To: Laurine Blazer, LPN   ----- Message ----- From: Herminio Commons, MD Sent: 09/30/2019  11:44 AM EST To: Laurine Blazer, LPN  Normal pumping function.

## 2019-10-14 NOTE — Telephone Encounter (Signed)
Patient informed. 

## 2019-10-27 ENCOUNTER — Encounter: Payer: Self-pay | Admitting: *Deleted

## 2019-10-27 NOTE — Telephone Encounter (Signed)
This encounter was created in error - please disregard.

## 2019-10-28 ENCOUNTER — Telehealth: Payer: Self-pay

## 2019-10-28 ENCOUNTER — Other Ambulatory Visit: Payer: Self-pay

## 2019-10-28 DIAGNOSIS — E559 Vitamin D deficiency, unspecified: Secondary | ICD-10-CM

## 2019-10-28 MED ORDER — VITAMIN D (ERGOCALCIFEROL) 1.25 MG (50000 UNIT) PO CAPS: 50000.0000 [IU] | ORAL_CAPSULE | ORAL | 0 refills | Status: DC

## 2019-10-28 NOTE — Telephone Encounter (Signed)
Please refill Vit D

## 2019-10-28 NOTE — Telephone Encounter (Signed)
Medication refilled

## 2019-10-30 ENCOUNTER — Other Ambulatory Visit: Payer: Self-pay | Admitting: Family Medicine

## 2019-11-03 ENCOUNTER — Telehealth: Payer: Self-pay | Admitting: *Deleted

## 2019-11-03 NOTE — Telephone Encounter (Signed)
Advised patient medication should be ready at pharmacy. Refilled on 10/30/2019

## 2019-11-03 NOTE — Telephone Encounter (Signed)
Pt was wanting a refill on her buspar 5 mg as she is out of the medication

## 2019-12-02 ENCOUNTER — Telehealth: Payer: Self-pay | Admitting: Allergy & Immunology

## 2019-12-02 ENCOUNTER — Ambulatory Visit: Payer: Medicaid Other | Admitting: Allergy & Immunology

## 2019-12-02 NOTE — Telephone Encounter (Signed)
Patient called and would like to know if she could get the covid shot with her asthma problems  [phone]

## 2019-12-02 NOTE — Telephone Encounter (Signed)
Spoke to patient and went over the appropriate questions and informed her she can get the Covid vaccine.

## 2019-12-08 ENCOUNTER — Ambulatory Visit: Payer: Medicaid Other | Admitting: Family Medicine

## 2019-12-09 ENCOUNTER — Ambulatory Visit (INDEPENDENT_AMBULATORY_CARE_PROVIDER_SITE_OTHER): Payer: Medicaid Other | Admitting: Psychiatry

## 2019-12-09 ENCOUNTER — Encounter: Payer: Self-pay | Admitting: Psychiatry

## 2019-12-09 ENCOUNTER — Other Ambulatory Visit: Payer: Self-pay

## 2019-12-09 DIAGNOSIS — F32A Depression, unspecified: Secondary | ICD-10-CM

## 2019-12-09 DIAGNOSIS — F39 Unspecified mood [affective] disorder: Secondary | ICD-10-CM

## 2019-12-09 DIAGNOSIS — F411 Generalized anxiety disorder: Secondary | ICD-10-CM | POA: Diagnosis not present

## 2019-12-09 DIAGNOSIS — F329 Major depressive disorder, single episode, unspecified: Secondary | ICD-10-CM | POA: Diagnosis not present

## 2019-12-09 MED ORDER — PAROXETINE HCL 20 MG PO TABS: 30.0000 mg | ORAL_TABLET | Freq: Every day | ORAL | 1 refills | Status: DC

## 2019-12-09 MED ORDER — QUETIAPINE FUMARATE ER 50 MG PO TB24: 50.0000 mg | ORAL_TABLET | Freq: Every day | ORAL | 1 refills | Status: DC

## 2019-12-09 MED ORDER — HYDROXYZINE PAMOATE 25 MG PO CAPS: 25.0000 mg | ORAL_CAPSULE | Freq: Three times a day (TID) | ORAL | 1 refills | Status: DC | PRN
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
## 2019-12-10 ENCOUNTER — Ambulatory Visit (INDEPENDENT_AMBULATORY_CARE_PROVIDER_SITE_OTHER): Payer: Medicaid Other | Admitting: Family Medicine

## 2019-12-10 ENCOUNTER — Encounter: Payer: Self-pay | Admitting: Family Medicine

## 2019-12-10 VITALS — BP 142/89 | Ht 60.0 in | Wt 207.0 lb

## 2019-12-10 DIAGNOSIS — J302 Other seasonal allergic rhinitis: Secondary | ICD-10-CM

## 2019-12-10 DIAGNOSIS — F321 Major depressive disorder, single episode, moderate: Secondary | ICD-10-CM

## 2019-12-10 DIAGNOSIS — E785 Hyperlipidemia, unspecified: Secondary | ICD-10-CM

## 2019-12-10 DIAGNOSIS — F411 Generalized anxiety disorder: Secondary | ICD-10-CM | POA: Diagnosis not present

## 2019-12-10 DIAGNOSIS — E559 Vitamin D deficiency, unspecified: Secondary | ICD-10-CM

## 2019-12-10 DIAGNOSIS — Z1231 Encounter for screening mammogram for malignant neoplasm of breast: Secondary | ICD-10-CM

## 2019-12-10 DIAGNOSIS — Z6841 Body Mass Index (BMI) 40.0 and over, adult: Secondary | ICD-10-CM

## 2019-12-10 DIAGNOSIS — I10 Essential (primary) hypertension: Secondary | ICD-10-CM | POA: Diagnosis not present

## 2019-12-10 MED ORDER — CYCLOBENZAPRINE HCL 5 MG PO TABS: ORAL_TABLET | ORAL | 0 refills | Status: DC

## 2019-12-10 NOTE — Patient Instructions (Addendum)
F/U in [place], call if you need me before  Please schedule mammogram , 10/06 or shortly after , at checkout.  Please get fasting labs 1 week before appointment , fasting,: lipid, cmpand EGFR, TSSH and vit D and CBC  Thankful you have seen the Psychiatrist and I encourage you to continue close follow up a directed  .It is important that you exercise regularly at least 30 minutes 5 times a week. If you develop chest pain, have severe difficulty breathing, or feel very tired, stop exercising immediately and seek medical attention   Think about what you will eat, plan ahead. Choose " clean, green, fresh or frozen" over canned, processed or packaged foods which are more sugary, salty and fatty. 70 to 75% of food eaten should be vegetables and fruit. Three meals at set times with snacks allowed between meals, but they must be fruit or vegetables. Aim to eat over a 12 hour period , example 7 am to 7 pm, and STOP after  your last meal of the day. Drink water,generally about 64 ounces per day, no other drink is as healthy. Fruit juice is best enjoyed in a healthy way, by EATING the fruit.  Thanks for choosing [facility], we consider it a privelige to serve you.

## 2019-12-10 NOTE — Progress Notes (Signed)
Virtual Visit via Telephone Note  I connected with Karen Cox on 12/10/19 at  1:40 PM EST by telephone and verified that I am speaking with the correct person using two identifiers.  Location: Patient: home Provider: office   I discussed the limitations, risks, security and privacy concerns of performing an evaluation and management service by telephone and the availability of in person appointments. I also discussed with the patient that there may be a patient responsible charge related to this service. The patient expressed understanding and agreed to proceed.   History of Present Illness: F/U chronic problems, medication review, and refill medication when necessary. Review most recent labs and order labs which are due Review preventive health and update with necessary referrals or immunizations as indicated Right sided back pain moving to center for past 2 months started as a 10 now down to a 4/5 , uses b[facility]ay with minimal result Currently with new psychiatrist and has had medication adjusted recently, seems to  Be doing well so far Working on weight loss with lifestyle change frustrated due to covid restrictions but intends to increase exercise  And time outside   Observations/Objective: BP (!) 142/89   Ht 5' (1.524 m)   Wt 207 lb (93.9 kg)   LMP 06/27/2013   BMI 40.43 kg/m  Good communication with no confusion and intact memory. Alert and oriented x 3 No signs of respiratory distress during speech    Assessment and Plan: Elevated blood-pressure reading without diagnosis of hypertension Needs in office re evaluation to detemine need for mdication DASH diet and commitment to daily physical activity for a minimum of 30 minutes discussed and encouraged, as a part of hypertension management. The importance of attaining a healthy weight is also discussed.  BP/Weight 12/10/2019 10/13/2019 09/08/2019 08/28/2019 08/27/2019 07/29/2019 0000000  Systolic BP A999333 A999333 A999333 A999333 154  [id number]  Diastolic BP 89 89 89 89 89 90 70  Wt. (Lbs) 207 207 207 207.6 200 218.04 200  BMI 40.43 40.43 40.43 40.54 33.28 36.28 33.28       Vitamin D deficiency Continue once weekly vit D  GAD (generalized anxiety disorder) treatyed by psych improvement noted by patient, has had dose reductions  Depression, major, single episode, moderate (HCC) Controlled, no change in medication, psych treats   Dyslipidemia Hyperlipidemia:Low fat diet discussed and encouraged.   Lipid Panel  Lab Results  Component Value Date   CHOL 228 (H) 10/09/2018   HDL 54 10/09/2018   LDLCALC 152 (H) 10/09/2018   TRIG 102 10/09/2018   CHOLHDL 4.2 10/09/2018   Needs to change diet Updated lab needed at/ before next visit.     Seasonal allergies Controlled, no change in medication   Morbid obesity ([place])  Patient re-educated about  the importance of commitment to a  minimum of 150 minutes of exercise per week as able.  The importance of healthy food choices with portion control discussed, as well as eating regularly and within a 12 hour window most days. The need to choose "clean , green" food 50 to 75% of the time is discussed, as well as to make water the primary drink and set a goal of 64 ounces water daily.    Weight /BMI 12/10/2019 10/13/2019 09/08/2019  WEIGHT 207 lb 207 lb 207 lb  HEIGHT 5\' 0"  5\' 0"  5\' 0"   BMI 40.43 kg/m2 40.43 kg/m2 40.43 kg/m2      Essential hypertension Needs to star medication isf remains elevated a t next in  person visit. DASH diet and commitment to daily physical activity for a minimum of 30 minutes discussed and encouraged, as a part of hypertension management. The importance of attaining a healthy weight is also discussed.  BP/Weight 12/10/2019 10/13/2019 09/08/2019 08/28/2019 08/27/2019 07/29/2019 0000000  Systolic BP A999333 A999333 A999333 A999333 [id number]  Diastolic BP 89 89 89 89 89 90 70  Wt. (Lbs) 207 207 207 207.6 200 218.04 200  BMI 40.43 40.43 40.43 40.54 33.28  36.28 33.28          Follow Up Instructions:    I discussed the assessment and treatment plan with the patient. The patient was provided an opportunity to ask questions and all were answered. The patient agreed with the plan and demonstrated an understanding of the instructions.   The patient was advised to call back or seek an in-person evaluation if the symptoms worsen or if the condition fails to improve as anticipated.  I provided 20 minutes of non-face-to-face time during this encounter.   [name], MD

## 2019-12-11 ENCOUNTER — Telehealth (HOSPITAL_COMMUNITY): Payer: Self-pay | Admitting: *Deleted

## 2019-12-11 NOTE — Telephone Encounter (Signed)
Prior authorization for Seroquel received. Called [place] [facility] spoke with [place] who gave approval until 12/05/20. [place] [id number]. Notified pharmacy.

## 2019-12-12 ENCOUNTER — Encounter: Payer: Self-pay | Admitting: Family Medicine

## 2019-12-12 DIAGNOSIS — I10 Essential (primary) hypertension: Secondary | ICD-10-CM | POA: Insufficient documentation

## 2019-12-12 NOTE — Assessment & Plan Note (Signed)
Hyperlipidemia:Low fat diet discussed and encouraged.   Lipid Panel  Lab Results  Component Value Date   CHOL 228 (H) 10/09/2018   HDL 54 10/09/2018   LDLCALC 152 (H) 10/09/2018   TRIG 102 10/09/2018   CHOLHDL 4.2 10/09/2018   Needs to change diet Updated lab needed at/ before next visit.

## 2019-12-12 NOTE — Assessment & Plan Note (Signed)
treatyed by psych improvement noted by patient, has had dose reductions

## 2019-12-12 NOTE — Assessment & Plan Note (Signed)
Needs to star medication isf remains elevated a t next in person visit. DASH diet and commitment to daily physical activity for a minimum of 30 minutes discussed and encouraged, as a part of hypertension management. The importance of attaining a healthy weight is also discussed.  BP/Weight 12/10/2019 10/13/2019 09/08/2019 08/28/2019 08/27/2019 07/29/2019 0000000  Systolic BP A999333 A999333 A999333 A999333 [id number]  Diastolic BP 89 89 89 89 89 90 70  Wt. (Lbs) 207 207 207 207.6 200 218.04 200  BMI 40.43 40.43 40.43 40.54 33.28 36.28 33.28

## 2019-12-12 NOTE — Assessment & Plan Note (Signed)
Controlled, no change in medication  

## 2019-12-12 NOTE — Assessment & Plan Note (Signed)
  Patient re-educated about  the importance of commitment to a  minimum of 150 minutes of exercise per week as able.  The importance of healthy food choices with portion control discussed, as well as eating regularly and within a 12 hour window most days. The need to choose "clean , green" food 50 to 75% of the time is discussed, as well as to make water the primary drink and set a goal of 64 ounces water daily.    Weight /BMI 12/10/2019 10/13/2019 09/08/2019  WEIGHT 207 lb 207 lb 207 lb  HEIGHT 5\' 0"  5\' 0"  5\' 0"   BMI 40.43 kg/m2 40.43 kg/m2 40.43 kg/m2

## 2019-12-12 NOTE — Assessment & Plan Note (Signed)
Needs in office re evaluation to detemine need for mdication DASH diet and commitment to daily physical activity for a minimum of 30 minutes discussed and encouraged, as a part of hypertension management. The importance of attaining a healthy weight is also discussed.  BP/Weight 12/10/2019 10/13/2019 09/08/2019 08/28/2019 08/27/2019 07/29/2019 0000000  Systolic BP A999333 A999333 A999333 A999333 [id number]  Diastolic BP 89 89 89 89 89 90 70  Wt. (Lbs) 207 207 207 207.6 200 218.04 200  BMI 40.43 40.43 40.43 40.54 33.28 36.28 33.28

## 2019-12-12 NOTE — Assessment & Plan Note (Addendum)
Controlled, no change in medication, psych treats

## 2019-12-12 NOTE — Assessment & Plan Note (Signed)
Continue once weekly vit D

## 2019-12-29 ENCOUNTER — Other Ambulatory Visit: Payer: Self-pay

## 2019-12-29 ENCOUNTER — Encounter: Payer: Self-pay | Admitting: Psychiatry

## 2019-12-29 ENCOUNTER — Ambulatory Visit (INDEPENDENT_AMBULATORY_CARE_PROVIDER_SITE_OTHER): Payer: Medicaid Other | Admitting: Psychiatry

## 2019-12-29 DIAGNOSIS — F39 Unspecified mood [affective] disorder: Secondary | ICD-10-CM

## 2019-12-29 DIAGNOSIS — F329 Major depressive disorder, single episode, unspecified: Secondary | ICD-10-CM

## 2019-12-29 DIAGNOSIS — Z79899 Other long term (current) drug therapy: Secondary | ICD-10-CM | POA: Diagnosis not present

## 2019-12-29 DIAGNOSIS — F411 Generalized anxiety disorder: Secondary | ICD-10-CM

## 2019-12-29 DIAGNOSIS — F32A Depression, unspecified: Secondary | ICD-10-CM

## 2019-12-29 DIAGNOSIS — Z9189 Other specified personal risk factors, not elsewhere classified: Secondary | ICD-10-CM

## 2019-12-29 MED ORDER — BUPROPION HCL ER (XL) 300 MG PO TB24: 300.0000 mg | ORAL_TABLET | ORAL | 0 refills | Status: DC

## 2019-12-29 NOTE — Progress Notes (Signed)
Provider Location : [facility] Patient Location : Home  Virtual Visit via Video Note  I connected with Karen Cox on 12/29/19 at  9:20 AM EDT by a video enabled telemedicine application and verified that I am speaking with the correct person using two identifiers.   I discussed the limitations of evaluation and management by telemedicine and the availability of in person appointments. The patient expressed understanding and agreed to proceed.    I discussed the assessment and treatment plan with the patient. The patient was provided an opportunity to ask questions and all were answered. The patient agreed with the plan and demonstrated an understanding of the instructions.   The patient was advised to call back or seek an in-person evaluation if the symptoms worsen or if the condition fails to improve as anticipated.   [facility] [facility] MD OP Progress Note  12/29/2019 9:52 AM [facility]  MRN:  [id number]  Chief Complaint:  Chief Complaint    Follow-up     HPI: Ms. Karen Cox is a 59 year old African-American female, lives in [place], single, has a history of generalized anxiety disorder, episodic mood disorder, depressive disorder unspecified, morbid obesity, seasonal allergies, insomnia was evaluated by telemedicine today.  Patient today reports she is currently making progress with regards to her anxiety symptoms.  She has not noticed significant anxiety attacks since being on the medications.  She reports she is tolerating the increased dosage of Paxil as well as Seroquel well.  She denies side effects.  She reports sleep has improved.  Patient denies any suicidality, homicidality or perceptual disturbances.  She reports she is compliant on all her medications.  Patient reports she has upcoming appointment with her therapist on April 15 and she plans to keep it.  Patient although a limited historian was able to answer questions appropriately although in short phrases.   Patient had difficulty hearing, unknown if this was due to the video connection problem and questions had to be repeated several times.  Patient however overall appeared to be pleasant, alert, and cooperative. Visit Diagnosis:    ICD-10-CM   1. GAD (generalized anxiety disorder)  F41.1 EKG 12-Lead  2. Episodic mood disorder (HCC)  F39 buPROPion (WELLBUTRIN XL) 300 MG 24 hr tablet  3. Depression, unspecified depression type  F32.9   4. High risk medication use  Z79.899 Lipid panel    Hemoglobin A1C    Prolactin  5. At risk for long QT syndrome  Z91.89 EKG 12-Lead    Past Psychiatric History: I have reviewed past psychiatric history from my progress note on 12/09/2019.  Past trials of BuSpar, hydroxyzine, melatonin, Paxil, Wellbutrin.  Past Medical History:  Past Medical History:  Diagnosis Date  . Allergy   . Anxiety   . Cervical high risk HPV (human papillomavirus) test positive 05/17/2014  . Depression   . Eczema   . GERD (gastroesophageal reflux disease)   . Hot flashes 09/01/2013  . Hyperlipidemia   . Mild mental slowing   . Peri-menopause 08/05/2013  . PMB (postmenopausal bleeding) 11/16/2013  . Urticaria   . Vaginal itching 08/05/2013  . Yeast infection 08/05/2013    Past Surgical History:  Procedure Laterality Date  . CERVICAL POLYPECTOMY N/A 01/06/2014   Procedure: CERVICAL POLYPECTOMY;  Surgeon: Florian Buff, MD;  Location: [facility];  Service: Gynecology;  Laterality: N/A;  . COLONOSCOPY  09/19/2012   Procedure: COLONOSCOPY;  Surgeon: Danie Binder, MD;  Location: [facility];  Service: Endoscopy;  Laterality: N/A;  8:30 AM  . DILITATION & CURRETTAGE/HYSTROSCOPY WITH THERMACHOICE ABLATION N/A 01/06/2014   Procedure: DILATATION & CURETTAGE/HYSTEROSCOPY WITH THERMACHOICE ABLATION;  Surgeon: Florian Buff, MD;  Location: [facility];  Service: Gynecology;  Laterality: N/A;  total therapy time:  9 minutes 12 seconds ,     temperature:87 degrees  . NO PAST SURGERIES      Family  Psychiatric History: I have reviewed family psychiatric history from my progress note on 12/09/2019.  Family History:  Family History  Problem Relation Age of Onset  . Diabetes Father   . Heart disease Father   . Hypertension Father   . Hyperlipidemia Father   . Diabetes Maternal Uncle   . Anemia Maternal Uncle   . Arthritis Maternal Uncle   . Anxiety disorder Maternal Uncle   . Arthritis Maternal Grandmother   . Anxiety disorder Maternal Grandmother   . Heart disease Maternal Grandfather        pacemaker  . Anxiety disorder Daughter   . Colon cancer Neg Hx   . Cancer Neg Hx   . Allergic rhinitis Neg Hx   . Angioedema Neg Hx   . Asthma Neg Hx   . Eczema Neg Hx   . Immunodeficiency Neg Hx   . Urticaria Neg Hx   . Atopy Neg Hx     Social History: I have reviewed social history from my progress note on 12/09/2019. Social History   Socioeconomic History  . Marital status: Single    Spouse name: Not on file  . Number of children: Not on file  . Years of education: Not on file  . Highest education level: Not on file  Occupational History  . Not on file  Tobacco Use  . Smoking status: Former Smoker    Packs/day: 0.25    Years: 3.00    Pack years: 0.75    Types: Cigarettes    Quit date: 09/03/1981    Years since quitting: 38.3  . Smokeless tobacco: Never Used  Substance and Sexual Activity  . Alcohol use: No  . Drug use: No  . Sexual activity: Never    Birth control/protection: Post-menopausal  Other Topics Concern  . Not on file  Social History Narrative  . Not on file   Social Determinants of Health   Financial Resource Strain:   . Difficulty of Paying Living Expenses:   Food Insecurity:   . Worried About Charity fundraiser in the Last Year:   . Arboriculturist in the Last Year:   Transportation Needs:   . Film/video editor (Medical):   Marland Kitchen Lack of Transportation (Non-Medical):   Physical Activity:   . Days of Exercise per Week:   . Minutes of Exercise  per Session:   Stress:   . Feeling of Stress :   Social Connections:   . Frequency of Communication with Friends and Family:   . Frequency of Social Gatherings with Friends and Family:   . Attends Religious Services:   . Active Member of Clubs or Organizations:   . Attends Archivist Meetings:   Marland Kitchen Marital Status:     Allergies:  Allergies  Allergen Reactions  . Benadryl  [Diphenhydramine Hcl (Sleep)]   . Benadryl [Diphenhydramine Hcl] Other (See Comments)    Makes congestion worst   . Dextromethorphan Hbr Hives  . Robitussin (Alcohol Free) [Guaifenesin]     Makes cough worse  . Aspirin Rash and Hives    rash  . Diphenhydramine Hcl  Hives and Rash    Makes congestion worst   . Latex Rash    Metabolic Disorder Labs: Lab Results  Component Value Date   HGBA1C 4.9 08/18/2013   MPG 94 08/18/2013   MPG 105 08/18/2012   No results found for: PROLACTIN Lab Results  Component Value Date   CHOL 228 (H) 10/09/2018   TRIG 102 10/09/2018   HDL 54 10/09/2018   CHOLHDL 4.2 10/09/2018   VLDL 20 08/17/2016   LDLCALC 152 (H) 10/09/2018   LDLCALC 124 (H) 03/04/2018   Lab Results  Component Value Date   TSH 2.102 08/27/2019   TSH 2.20 07/29/2019    Therapeutic Level Labs: No results found for: LITHIUM No results found for: VALPROATE No components found for:  CBMZ  Current Medications: Current Outpatient Medications  Medication Sig Dispense Refill  . buPROPion (WELLBUTRIN XL) 300 MG 24 hr tablet Take 1 tablet (300 mg total) by mouth every morning. 90 tablet 0  . cetirizine (ZYRTEC) 10 MG tablet Take 1 tablet (10 mg total) by mouth 2 (two) times daily. 60 tablet 5  . Cholecalciferol (VITAMIN D3) 125 MCG (5000 UT) CAPS Take by mouth.    . cyclobenzaprine (FLEXERIL) 5 MG tablet Take one tablet at bedtime as needed for back pain and spasm 15 tablet 0  . famotidine (PEPCID) 20 MG tablet Take 1 tablet (20 mg total) by mouth daily. 30 tablet 5  . hydrOXYzine (VISTARIL)  25 MG capsule Take 1 capsule (25 mg total) by mouth 3 (three) times daily as needed. For severe anxiety attacks only 90 capsule 1  . montelukast (SINGULAIR) 10 MG tablet Take 1 tablet (10 mg total) by mouth at bedtime. 30 tablet 3  . Olopatadine HCl (PAZEO) 0.7 % SOLN Place 1 drop into both eyes 1 day or 1 dose. 2.5 mL 5  . OVER THE COUNTER MEDICATION Take 1 capsule by mouth daily. Natural vitamins & minerals formula for healthier hair    . PARoxetine (PAXIL) 20 MG tablet Take 1.5 tablets (30 mg total) by mouth daily. 45 tablet 1  . QUEtiapine (SEROQUEL XR) 50 MG TB24 24 hr tablet Take 1 tablet (50 mg total) by mouth at bedtime. For anxiety, sleep, mood swings 30 tablet 1  . triamcinolone cream (KENALOG) 0.5 % Apply 1 application topically 2 (two) times daily. 15 g 1  . Vitamin D, Ergocalciferol, (DRISDOL) 1.25 MG (50000 UNIT) CAPS capsule Take 1 capsule (50,000 Units total) by mouth every 7 (seven) days. 12 capsule 0  . fluticasone (FLONASE) 50 MCG/ACT nasal spray Place 1 spray into both nostrils daily. (Patient not taking: Reported on 12/29/2019) 16 g 5   Current Facility-Administered Medications  Medication Dose Route Frequency Provider Last Rate Last Admin  . omalizumab Arvid Right) injection 300 mg  300 mg Subcutaneous Q28 days Valentina Shaggy, MD   300 mg at 12/31/17 1035     Musculoskeletal: Strength & Muscle Tone: UTA Gait & Station: normal Patient leans: N/A  Psychiatric Specialty Exam: Review of Systems  Psychiatric/Behavioral: The patient is nervous/anxious.   All other systems reviewed and are negative.   Last menstrual period 06/27/2013.There is no height or weight on file to calculate BMI.  General Appearance: Casual  Eye Contact:  Fair  Speech:  Normal Rate  Volume:  Normal  Mood:  Anxious improving  Affect:  Congruent  Thought Process:  Goal Directed and Descriptions of Associations: Intact  Orientation:  Full (Time, Place, and Person)  Thought Content: Logical  Suicidal Thoughts:  No  Homicidal Thoughts:  No  Memory:  Immediate;   Fair Recent;   Fair Remote;   Fair  Judgement:  Fair  Insight:  Fair  Psychomotor Activity:  Normal  Concentration:  Concentration: Fair and Attention Span: Fair  Recall:  [employer] of Knowledge: Fair  Language: Fair  Akathisia:  No  Handed:  Right  AIMS (if indicated): UTA  Assets:  Communication Skills Desire for Improvement Housing Social Support  ADL's:  Intact  Cognition: WNL  Sleep:  Fair   Screenings: GAD-7     Office Visit from 10/13/2019 in [facility] Office Visit from 09/08/2019 in [facility]  Total GAD-7 Score  7  15    PHQ2-9     Office Visit from 12/10/2019 in [facility] Office Visit from 09/08/2019 in [facility] Office Visit from 07/29/2019 in [facility] Visit from 10/09/2018 in [facility] Visit from 03/04/2018 in [facility]  PHQ-2 Total Score  0  2  0  0  0  PHQ-9 Total Score  0  9  --  --  --       Assessment and Plan: Ms. Samon Ptacek is a 59 year old African-American female, single, lives in [place], has a history of GAD, episodic mood disorder, depressive disorder unspecified, insomnia, morbid obesity was evaluated by telemedicine today.  Patient with psychosocial stressors of the pandemic, her own health problems.  She is biologically predisposed given her history of mental health problems in her family, her own history of trauma.  Patient continues to be a limited historian however was able to participate in evaluation today and appeared to be pleasant and cooperative.  Plan as noted below.  Plan GAD-improving Paroxetine 30 mg p.o. daily Hydroxyzine at reduced dose of 25 mg p.o. 3 times daily as needed for anxiety attacks only. Seroquel extended release 50 mg p.o. nightly   Depressive disorder unspecified-improving Wellbutrin 300 mg p.o. daily Paxil 30 mg p.o. daily  Episodic  mood disorder-rule out bipolar disorder-improving Seroquel extended release 50 mg p.o. nightly   At risk for QT syndrome-will order EKG.  Patient advised to get EKG done to monitor QT.  High risk medication use-patient is on Seroquel and will benefit from the following labs, lipid panel, hemoglobin A1c, prolactin.  Patient agrees to get it done at her primary care office.  Patient advised to make an appointment with her therapist-she reports she has upcoming appointment on April 15.  Patient advised to sign a release to obtain medical records from [facility] provided fax number for our clinic so she can request it herself if needed-pending.  Follow-up in clinic in 1 month or sooner if needed.  I have spent atleast 30 minutes non face to face with patient today. More than 50 % of the time was spent for preparing to see the patient ( e.g., review of test, records ), obtaining and to review and separately obtained history , ordering medications and test ,psychoeducation and supportive psychotherapy and care coordination,as well as documenting clinical information in electronic health record. This note was generated in part or whole with voice recognition software. Voice recognition is usually quite accurate but there are transcription errors that can and very often do occur. I apologize for any typographical errors that were not detected and corrected.       Ursula Alert, MD 12/29/2019, 9:52 AM

## 2020-01-13 ENCOUNTER — Other Ambulatory Visit: Payer: Self-pay | Admitting: Psychiatry

## 2020-01-13 ENCOUNTER — Other Ambulatory Visit: Payer: Self-pay | Admitting: Family Medicine

## 2020-01-13 DIAGNOSIS — F39 Unspecified mood [affective] disorder: Secondary | ICD-10-CM

## 2020-01-13 DIAGNOSIS — F329 Major depressive disorder, single episode, unspecified: Secondary | ICD-10-CM

## 2020-01-13 DIAGNOSIS — F411 Generalized anxiety disorder: Secondary | ICD-10-CM

## 2020-01-13 DIAGNOSIS — E559 Vitamin D deficiency, unspecified: Secondary | ICD-10-CM

## 2020-01-13 DIAGNOSIS — F32A Depression, unspecified: Secondary | ICD-10-CM

## 2020-01-29 ENCOUNTER — Telehealth: Payer: Self-pay

## 2020-01-29 NOTE — Telephone Encounter (Signed)
I have not received it yet.

## 2020-01-29 NOTE — Telephone Encounter (Signed)
pt wanted to know if you recevied her yellow form in a green envoiope wanted to know if you received it.  pt stated she mailed it last month. pt was told that i have not seen but i would ask doctor because i was out of the office.

## 2020-02-03 ENCOUNTER — Ambulatory Visit: Payer: Medicaid Other | Admitting: Allergy & Immunology

## 2020-02-03 ENCOUNTER — Other Ambulatory Visit: Payer: Self-pay

## 2020-02-03 ENCOUNTER — Telehealth (INDEPENDENT_AMBULATORY_CARE_PROVIDER_SITE_OTHER): Payer: Medicaid Other | Admitting: Psychiatry

## 2020-02-03 ENCOUNTER — Encounter: Payer: Self-pay | Admitting: Psychiatry

## 2020-02-03 DIAGNOSIS — F259 Schizoaffective disorder, unspecified: Secondary | ICD-10-CM | POA: Diagnosis not present

## 2020-02-03 DIAGNOSIS — F411 Generalized anxiety disorder: Secondary | ICD-10-CM

## 2020-02-03 DIAGNOSIS — R4183 Borderline intellectual functioning: Secondary | ICD-10-CM

## 2020-02-03 MED ORDER — HYDROXYZINE PAMOATE 25 MG PO CAPS: 25.0000 mg | ORAL_CAPSULE | Freq: Three times a day (TID) | ORAL | 1 refills | Status: DC | PRN

## 2020-02-03 MED ORDER — PAROXETINE HCL 20 MG PO TABS: 30.0000 mg | ORAL_TABLET | Freq: Every day | ORAL | 1 refills | Status: DC

## 2020-02-03 MED ORDER — QUETIAPINE FUMARATE ER 50 MG PO TB24: 100.0000 mg | ORAL_TABLET | Freq: Every day | ORAL | 1 refills | Status: DC

## 2020-02-03 NOTE — Progress Notes (Signed)
Provider Location : [facility] Patient Location : Home  Virtual Visit via Video Note  I connected with Karen Cox on 02/03/20 at 11:00 AM EDT by a video enabled telemedicine application and verified that I am speaking with the correct person using two identifiers.   I discussed the limitations of evaluation and management by telemedicine and the availability of in person appointments. The patient expressed understanding and agreed to proceed.   I discussed the assessment and treatment plan with the patient. The patient was provided an opportunity to ask questions and all were answered. The patient agreed with the plan and demonstrated an understanding of the instructions.   The patient was advised to call back or seek an in-person evaluation if the symptoms worsen or if the condition fails to improve as anticipated.   [facility] MD OP Progress Note  02/03/2020 11:50 AM Karen Cox  MRN:  [id number]  Chief Complaint:  Chief Complaint    Follow-up     HPI: Karen Cox is a 59 year old African-American female, lives in [place], single, has a history of generalized anxiety disorder, episodic mood disorder, depressive disorder unspecified, morbid obesity, seasonal allergies, insomnia was evaluated by telemedicine today.  Patient today reports she continues to struggle with mood swings, feeling frustrated often, irritability as well as hallucinations.  She reports she has been struggling with hallucinations since the past 26 years.  She being a limited historian was not able to elaborate much about her hallucinations.  She reports however that she has it often.  She reports she sees visions and images of people in the hallway, coming out of the bathroom, spirits and so on.  She also reports hearing someone calling her name often.  She reports she gets frustrated by these hallucinations.  She denies these hallucinations as command auditory hallucinations.  She reports her medications  as helpful to some extent.  She denies side effects to these medications.  She reports sleep is good.  She denies any suicidality or homicidality.  She reports she was able to have an appointment with her therapist and is motivated to continue the same.  Patient reports she has an appointment scheduled with her primary care provider to get her labs done.  Patient denies any other concerns today. Visit Diagnosis:    ICD-10-CM   1. Schizoaffective disorder, unspecified type ([place])  F25.9 hydrOXYzine (VISTARIL) 25 MG capsule    PARoxetine (PAXIL) 20 MG tablet    QUEtiapine (SEROQUEL XR) 50 MG TB24 24 hr tablet  2. GAD (generalized anxiety disorder)  F41.1 hydrOXYzine (VISTARIL) 25 MG capsule    PARoxetine (PAXIL) 20 MG tablet  3. Borderline intellectual functioning  R41.83     Past Psychiatric History: I have reviewed past psychiatric history from my progress note on 12/09/2019.  Past trials of BuSpar, hydroxyzine, melatonin, Paxil, Wellbutrin.  Past Medical History:  Past Medical History:  Diagnosis Date  . Allergy   . Anxiety   . Cervical high risk HPV (human papillomavirus) test positive 05/17/2014  . Depression   . Eczema   . GERD (gastroesophageal reflux disease)   . Hot flashes 09/01/2013  . Hyperlipidemia   . Mild mental slowing   . Peri-menopause 08/05/2013  . PMB (postmenopausal bleeding) 11/16/2013  . Urticaria   . Vaginal itching 08/05/2013  . Yeast infection 08/05/2013    Past Surgical History:  Procedure Laterality Date  . CERVICAL POLYPECTOMY N/A 01/06/2014   Procedure: CERVICAL POLYPECTOMY;  Surgeon: Florian Buff, MD;  Location: [facility];  Service: Gynecology;  Laterality: N/A;  . COLONOSCOPY  09/19/2012   Procedure: COLONOSCOPY;  Surgeon: Danie Binder, MD;  Location: [facility];  Service: Endoscopy;  Laterality: N/A;  8:30 AM  . DILITATION & CURRETTAGE/HYSTROSCOPY WITH THERMACHOICE ABLATION N/A 01/06/2014   Procedure: DILATATION & CURETTAGE/HYSTEROSCOPY WITH  THERMACHOICE ABLATION;  Surgeon: Florian Buff, MD;  Location: [facility];  Service: Gynecology;  Laterality: N/A;  total therapy time:  9 minutes 12 seconds ,     temperature:87 degrees  . NO PAST SURGERIES      Family Psychiatric History: I have reviewed family psychiatric history from my progress note on 12/09/2019.  Family History:  Family History  Problem Relation Age of Onset  . Diabetes Father   . Heart disease Father   . Hypertension Father   . Hyperlipidemia Father   . Diabetes Maternal Uncle   . Anemia Maternal Uncle   . Arthritis Maternal Uncle   . Anxiety disorder Maternal Uncle   . Arthritis Maternal Grandmother   . Anxiety disorder Maternal Grandmother   . Heart disease Maternal Grandfather        pacemaker  . Anxiety disorder Daughter   . Colon cancer Neg Hx   . Cancer Neg Hx   . Allergic rhinitis Neg Hx   . Angioedema Neg Hx   . Asthma Neg Hx   . Eczema Neg Hx   . Immunodeficiency Neg Hx   . Urticaria Neg Hx   . Atopy Neg Hx     Social History: Reviewed social history from my progress note on 12/09/2019. Social History   Socioeconomic History  . Marital status: Single    Spouse name: Not on file  . Number of children: Not on file  . Years of education: Not on file  . Highest education level: Not on file  Occupational History  . Not on file  Tobacco Use  . Smoking status: Former Smoker    Packs/day: 0.25    Years: 3.00    Pack years: 0.75    Types: Cigarettes    Quit date: 09/03/1981    Years since quitting: 38.4  . Smokeless tobacco: Never Used  Substance and Sexual Activity  . Alcohol use: No  . Drug use: No  . Sexual activity: Never    Birth control/protection: Post-menopausal  Other Topics Concern  . Not on file  Social History Narrative  . Not on file   Social Determinants of Health   Financial Resource Strain:   . Difficulty of Paying Living Expenses:   Food Insecurity:   . Worried About Charity fundraiser in the Last Year:   . Arts development officer in the Last Year:   Transportation Needs:   . Film/video editor (Medical):   Marland Kitchen Lack of Transportation (Non-Medical):   Physical Activity:   . Days of Exercise per Week:   . Minutes of Exercise per Session:   Stress:   . Feeling of Stress :   Social Connections:   . Frequency of Communication with Friends and Family:   . Frequency of Social Gatherings with Friends and Family:   . Attends Religious Services:   . Active Member of Clubs or Organizations:   . Attends Archivist Meetings:   Marland Kitchen Marital Status:     Allergies:  Allergies  Allergen Reactions  . Benadryl  [Diphenhydramine Hcl (Sleep)]   . Benadryl [Diphenhydramine Hcl] Other (See Comments)    Makes congestion worst   .  Dextromethorphan Hbr Hives  . Robitussin (Alcohol Free) [Guaifenesin]     Makes cough worse  . Aspirin Rash and Hives    rash  . Diphenhydramine Hcl Hives and Rash    Makes congestion worst   . Latex Rash    Metabolic Disorder Labs: Lab Results  Component Value Date   HGBA1C 4.9 08/18/2013   MPG 94 08/18/2013   MPG 105 08/18/2012   No results found for: PROLACTIN Lab Results  Component Value Date   CHOL 228 (H) 10/09/2018   TRIG 102 10/09/2018   HDL 54 10/09/2018   CHOLHDL 4.2 10/09/2018   VLDL 20 08/17/2016   LDLCALC 152 (H) 10/09/2018   LDLCALC 124 (H) 03/04/2018   Lab Results  Component Value Date   TSH 2.102 08/27/2019   TSH 2.20 07/29/2019    Therapeutic Level Labs: No results found for: LITHIUM No results found for: VALPROATE No components found for:  CBMZ  Current Medications: Current Outpatient Medications  Medication Sig Dispense Refill  . conjugated estrogens (PREMARIN) vaginal cream Use a small amount of cream 3-4x per week    . estrogen, conjugated,-medroxyprogesterone (PREMPRO) 0.625-2.5 MG tablet Take by mouth.    Marland Kitchen buPROPion (WELLBUTRIN XL) 300 MG 24 hr tablet Take 1 tablet (300 mg total) by mouth every morning. 90 tablet 0  . cetirizine  (ZYRTEC) 10 MG tablet Take 1 tablet (10 mg total) by mouth 2 (two) times daily. 60 tablet 5  . Cholecalciferol (VITAMIN D3) 125 MCG (5000 UT) CAPS Take by mouth.    . cyclobenzaprine (FLEXERIL) 5 MG tablet Take one tablet at bedtime as needed for back pain and spasm 15 tablet 0  . famotidine (PEPCID) 20 MG tablet Take 1 tablet (20 mg total) by mouth daily. 30 tablet 5  . fluticasone (FLONASE) 50 MCG/ACT nasal spray Place 1 spray into both nostrils daily. (Patient not taking: Reported on 12/29/2019) 16 g 5  . hydrOXYzine (VISTARIL) 25 MG capsule Take 1 capsule (25 mg total) by mouth 3 (three) times daily as needed. For severe anxiety attacks only 90 capsule 1  . montelukast (SINGULAIR) 10 MG tablet Take 1 tablet (10 mg total) by mouth at bedtime. 30 tablet 3  . Olopatadine HCl (PAZEO) 0.7 % SOLN Place 1 drop into both eyes 1 day or 1 dose. 2.5 mL 5  . OVER THE COUNTER MEDICATION Take 1 capsule by mouth daily. Natural vitamins & minerals formula for healthier hair    . PARoxetine (PAXIL) 20 MG tablet Take 1.5 tablets (30 mg total) by mouth daily. 45 tablet 1  . PREMARIN vaginal cream USE A SMALL AMOUNT OFACREAM 3-4 TIMES PER WEEK.    Marland Kitchen PREMPRO 0.625-2.5 MG tablet Take 1 tablet by mouth daily.    . QUEtiapine (SEROQUEL XR) 50 MG TB24 24 hr tablet Take 2 tablets (100 mg total) by mouth at bedtime. 60 tablet 1  . triamcinolone cream (KENALOG) 0.5 % Apply 1 application topically 2 (two) times daily. 15 g 1  . Vitamin D, Ergocalciferol, (DRISDOL) 1.25 MG (50000 UNIT) CAPS capsule TAKE 1 CAPSULE BY MOUTH ONCE A WEEK. 12 capsule 0   Current Facility-Administered Medications  Medication Dose Route Frequency Provider Last Rate Last Admin  . omalizumab [place] Right) injection 300 mg  300 mg Subcutaneous Q28 days Valentina Shaggy, MD   300 mg at 12/31/17 1035     Musculoskeletal: Strength & Muscle Tone: UTA Gait & Station: normal Patient leans: N/A  Psychiatric Specialty Exam: Review of Systems  Psychiatric/Behavioral: Positive for hallucinations. The patient is nervous/anxious.   All other systems reviewed and are negative.   Last menstrual period 06/27/2013.There is no height or weight on file to calculate BMI.  General Appearance: Casual  Eye Contact:  Good  Speech:  Clear and Coherent  Volume:  Normal  Mood:  Anxious and Mood swings  Affect:  Congruent  Thought Process:  Goal Directed and Descriptions of Associations: Intact  Orientation:  Full (Time, Place, and Person)  Thought Content: Hallucinations: Auditory Visual images, spirits, some one calling her name  Suicidal Thoughts:  No  Homicidal Thoughts:  No  Memory:  Immediate;   Fair Recent;   Fair Remote;   Fair  Judgement:  Fair  Insight:  Fair  Psychomotor Activity:  Normal  Concentration:  Concentration: Fair and Attention Span: Fair  Recall:  [employer] of Knowledge: Fair  Language: Fair  Akathisia:  No  Handed:  Right  AIMS (if indicated): UTA  Assets:  Communication Skills Desire for Improvement Housing Social Support  ADL's:  Intact  Cognition: WNL  Sleep:  Fair   Screenings: GAD-7     Office Visit from 10/13/2019 in [facility] Office Visit from 09/08/2019 in [facility]  Total GAD-7 Score  7  15    PHQ2-9     Office Visit from 12/10/2019 in [facility] Office Visit from 09/08/2019 in [facility] Office Visit from 07/29/2019 in [facility] Visit from 10/09/2018 in [facility] Visit from 03/04/2018 in [facility]  PHQ-2 Total Score  0  2  0  0  0  PHQ-9 Total Score  0  9  --  --  --       Assessment and Plan: Karen Cox is a 59 year old African-American female, single, lives in [place], has a history of GAD, episodic mood disorder, depressive disorder unspecified, insomnia, morbid obesity was evaluated by telemedicine today.  Patient with psychosocial stressors of the pandemic, her own health  problems.  Patient is biologically predisposed given her history of mental health problems in her family, her own history of trauma.  Patient continues to be a limited historian however Probation officer was able to obtain her medical records [facility].  Discussed plan as noted below.  Plan Schizoaffective disorder unspecified-unstable Continue Wellbutrin 300 mg p.o. daily Paxil 30 mg p.o. daily Increase Seroquel extended release to 100 mg p.o. nightly.  GAD-some progress Patient will continue to benefit from psychotherapy sessions with her therapist. Paxil 30 mg p.o. daily Hydroxyzine 25 mg p.o. 3 times daily as needed for anxiety attacks only. Seroquel extended release 100 mg p.o. nightly  Borderline intellectual function-patient appears to be a limited historian.  Patient reports she was in special classes in school.  She currently does not have a driver's license.  She reports she had problems with math growing up however does not remember ever being formally diagnosed with learning disability or reading disability.  However reports she get a lot of assistance from her brother who helps her out on a daily basis.  Also per review of the [facility] recovery service progress note by Dr. Hoyle Barr -dated 07/07/2019-patient does have a history of unspecified intellectual disability.'  I have reviewed medical records from the [facility] dated 06/28/2019, 01/19/2019, 06/20/2018-' patient with diagnosis of unspecified depressive disorder, unspecified anxiety disorder, unspecified intellectual disability-date diagnosed 12/30/2013-continue buspirone 5 mg p.o. twice daily, hydroxyzine as prescribed.  Review of chronic medical  conditions-patient also has a history of mood disorder.'  Patient encouraged today to get her labs as well as EKG done since she is on Seroquel.  Provided medication education about these medications including involuntary movements, weight gain, hyperlipidemia, elevated hemoglobin [id number],  metabolic syndrome, dizziness and so on.  Patient advised to continue psychotherapy sessions with her therapist Clayborn Bigness at [phone].  Follow-up in clinic in 1 month or sooner if needed.  I have spent atleast 30 minutes non face to face with patient today. More than 50 % of the time was spent for preparing to see the patient ( e.g., review of test, records ), obtaining and to review and separately obtained history , ordering medications and test ,psychoeducation and supportive psychotherapy and care coordination,as well as documenting clinical information in electronic health record. This note was generated in part or whole with voice recognition software. Voice recognition is usually quite accurate but there are transcription errors that can and very often do occur. I apologize for any typographical errors that were not detected and corrected.      Ursula Alert, MD 02/03/2020, 11:50 AM

## 2020-02-15 ENCOUNTER — Other Ambulatory Visit: Payer: Self-pay | Admitting: Psychiatry

## 2020-02-15 ENCOUNTER — Ambulatory Visit: Payer: Medicaid Other | Admitting: Family Medicine

## 2020-02-15 DIAGNOSIS — F259 Schizoaffective disorder, unspecified: Secondary | ICD-10-CM

## 2020-02-15 DIAGNOSIS — F411 Generalized anxiety disorder: Secondary | ICD-10-CM

## 2020-02-26 ENCOUNTER — Encounter: Payer: Self-pay | Admitting: Allergy & Immunology

## 2020-02-26 ENCOUNTER — Ambulatory Visit (INDEPENDENT_AMBULATORY_CARE_PROVIDER_SITE_OTHER): Payer: Medicaid Other | Admitting: Allergy & Immunology

## 2020-02-26 ENCOUNTER — Other Ambulatory Visit: Payer: Self-pay

## 2020-02-26 VITALS — BP 140/90 | HR 90 | Ht 63.5 in | Wt 234.0 lb

## 2020-02-26 DIAGNOSIS — K219 Gastro-esophageal reflux disease without esophagitis: Secondary | ICD-10-CM

## 2020-02-26 DIAGNOSIS — L501 Idiopathic urticaria: Secondary | ICD-10-CM | POA: Diagnosis not present

## 2020-02-26 DIAGNOSIS — R059 Cough, unspecified: Secondary | ICD-10-CM

## 2020-02-26 DIAGNOSIS — R05 Cough: Secondary | ICD-10-CM | POA: Diagnosis not present

## 2020-02-26 DIAGNOSIS — J3089 Other allergic rhinitis: Secondary | ICD-10-CM | POA: Diagnosis not present

## 2020-02-26 MED ORDER — FLUTICASONE PROPIONATE 50 MCG/ACT NA SUSP: 2.0000 | Freq: Every day | NASAL | 5 refills | Status: DC

## 2020-02-26 MED ORDER — MONTELUKAST SODIUM 10 MG PO TABS: 10.0000 mg | ORAL_TABLET | Freq: Every day | ORAL | 3 refills | Status: DC

## 2020-02-26 MED ORDER — FAMOTIDINE 20 MG PO TABS: 20.0000 mg | ORAL_TABLET | Freq: Two times a day (BID) | ORAL | 5 refills | Status: DC

## 2020-02-26 NOTE — Patient Instructions (Signed)
Urticaria Decrease cetirizine10 mg to 1 tablet once a day. If hives occur may increase cetirizine 10 mg to one tablet twice a day.  Allergic rhinitis Continue montelukast 10 mg once a day to help with allergies. Start fluticasone nasal spray using 2 sprays each nostril once a day to help with stuffy nose.  Reflux Start famotidine 20 mg taking one tablet twice a day to help with reflux  Lifestyle Changes for Controlling GERD When you have GERD, stomach acid feels as if it's backing up toward your mouth. Whether or not you take medication to control your GERD, your symptoms can often be improved with lifestyle changes.   Raise Your Head  Reflux is more likely to strike when you're lying down flat, because stomach fluid can  flow backward more easily. Raising the head of your bed 4-6 inches can help. To do this:  Slide blocks or books under the legs at the head of your bed. Or, place a wedge under  the mattress. Many foam stores can make a suitable wedge for you. The wedge  should run from your waist to the top of your head.  Don't just prop your head on several pillows. This increases pressure on your  stomach. It can make GERD worse.  Watch Your Eating Habits Certain foods may increase the acid in your stomach or relax the lower esophageal sphincter, making GERD more likely. It's best to avoid the following:  Coffee, tea, and carbonated drinks (with and without caffeine)  Fatty, fried, or spicy food  Mint, chocolate, onions, and tomatoes  Any other foods that seem to irritate your stomach or cause you pain  Relieve the Pressure  Eat smaller meals, even if you have to eat more often.  Don't lie down right after you eat. Wait a few hours for your stomach to empty.  Avoid tight belts and tight-fitting clothes.  Lose excess weight.  Tobacco and Alcohol  Avoid smoking tobacco and drinking alcohol. They can make GERD symptoms worse.  Continue all other scheduled  medications. Please let us know if this treatment plan is not working well for you. Schedule follow up appointment in 6 months.

## 2020-02-26 NOTE — Progress Notes (Signed)
FOLLOW UP  Date of Service/Encounter:  02/26/20   Assessment:   Chronic idiopathic urticaria  Perennial allergic rhinitis  Reflex   Plan/Recommendations:   Urticaria Decrease cetirizine10 mg to 1 tablet once a day. If hives occur may increase cetirizine 10 mg to one tablet twice a day.  Allergic rhinitis Continue montelukast 10 mg once a day to help with allergies. Start fluticasone nasal spray using 2 sprays each nostril once a day to help with stuffy nose.  Reflux Start famotidine 20 mg taking one tablet twice a day to help with reflux  Subjective:   Karen Cox is a 59 y.o. female presenting today for follow up of  Chief Complaint  Patient presents with  . Allergic Rhinitis     eyes watery, nose running    Karen Cox has a history of the following: Patient Active Problem List   Diagnosis Date Noted  . Borderline intellectual functioning 02/03/2020  . High risk medication use 12/29/2019  . At risk for long QT syndrome 12/29/2019  . Essential hypertension 12/12/2019  . Episodic mood disorder ([place]) 12/09/2019  . Insomnia 10/13/2019  . Depression, major, single episode, moderate ([place]) 09/13/2019  . GAD (generalized anxiety disorder) 09/13/2019  . Hot flashes due to menopause 03/08/2018  . Vitamin D deficiency 09/06/2017  . Postmenopause 12/16/2014  . Menopause present 08/19/2014  . Dyslipidemia 05/29/2014  . Cervical high risk HPV (human papillomavirus) test positive 05/17/2014  . Endometrial polyp 12/17/2013  . PMB (postmenopausal bleeding) 11/16/2013  . Hormone replacement therapy (HRT) 09/01/2013  . Gynecomastia 01/05/2013  . Depression 12/31/2010  . Seasonal allergies 12/31/2010  . Hyperlipidemia LDL goal <100 04/18/2010  . Morbid obesity ([place]) 02/13/2010    History obtained from: chart review and patient.  Karen Cox is a 59 y.o. female presenting for a follow up visit.  Allergic Rhinitis Symptom History: Allergic rhinitis is  reported as moderately controlled. She reports occasional nasal congestion and post nasal drip. She denies any rhinorrhea. She did report clear rhinorrhea two weeks ago. She did not ever get the fluticasone that was prescribed at her last office visit.   Urticaria Symptom History: She reports as well controlled. She has not had any flare ups since November. She is currently taking cetirizine 10 mg 1-2 tablets at night and montelukast 10 mg once a day. She is currently not taking famotidine.   Reflux is reported as not well controlled. She reports feeling food coming up in her throat mostly after lying down and sometimes during the day. Her throat will also hurt and sting. She denies any sour taste in her mouth. She has never seen GI before.  Otherwise, there have been no changes to her past medical history, surgical history, family history, or social history.    Review of Systems  Constitutional: Negative for chills and fever.  HENT: Negative for congestion.   Eyes:       Negative for occular pruritus  Respiratory: Negative for cough, shortness of breath and wheezing.   Gastrointestinal: Positive for heartburn.  Skin: Negative for itching and rash.  Endo/Heme/Allergies: Positive for environmental allergies.       Objective:   Blood pressure 140/90, pulse 90, height 5' 3.5" (1.613 m), weight 234 lb (106.1 kg), last menstrual period 06/27/2013. Body mass index is 40.8 kg/m.   Physical Exam:  Physical Exam  Constitutional: She appears well-developed and well-nourished.  HENT:  Head: Normocephalic and atraumatic.  Right Ear: External ear normal.  Left Ear: External  ear normal.  Mouth/Throat: Oropharynx is clear and moist.  Bilateral nasal turbinates slightly edematous with clear drainage.  Eyes: Conjunctivae are normal.  Cardiovascular: Normal rate and regular rhythm.  Respiratory: Effort normal and breath sounds normal.  Lungs clear to auscultation.  Musculoskeletal:      Cervical back: Neck supple.  Neurological: She is alert.  Skin: Skin is warm and dry.  No rash noted  Psychiatric: She has a normal mood and affect.     Diagnostic studies: none   Thank you for the opportunity to take care of you. Please let us know if you have any questions or concerns.  Althea Charon, FNP [facility]     Salvatore Marvel, MD  [facility]

## 2020-03-02 ENCOUNTER — Other Ambulatory Visit: Payer: Self-pay

## 2020-03-02 ENCOUNTER — Telehealth (INDEPENDENT_AMBULATORY_CARE_PROVIDER_SITE_OTHER): Payer: Medicaid Other | Admitting: Psychiatry

## 2020-03-02 ENCOUNTER — Encounter: Payer: Self-pay | Admitting: Psychiatry

## 2020-03-02 DIAGNOSIS — F259 Schizoaffective disorder, unspecified: Secondary | ICD-10-CM | POA: Insufficient documentation

## 2020-03-02 DIAGNOSIS — F411 Generalized anxiety disorder: Secondary | ICD-10-CM

## 2020-03-02 DIAGNOSIS — F251 Schizoaffective disorder, depressive type: Secondary | ICD-10-CM

## 2020-03-02 DIAGNOSIS — R4183 Borderline intellectual functioning: Secondary | ICD-10-CM | POA: Diagnosis not present

## 2020-03-02 MED ORDER — BUPROPION HCL ER (XL) 300 MG PO TB24: 300.0000 mg | ORAL_TABLET | ORAL | 0 refills | Status: DC

## 2020-03-02 MED ORDER — PAROXETINE HCL 20 MG PO TABS: ORAL_TABLET | ORAL | 0 refills | Status: DC

## 2020-03-02 MED ORDER — QUETIAPINE FUMARATE ER 50 MG PO TB24: 100.0000 mg | ORAL_TABLET | Freq: Every day | ORAL | 0 refills | Status: DC

## 2020-03-02 NOTE — Progress Notes (Signed)
Provider Location : [facility] Patient Location : Home  Virtual Visit via Video Note  I connected with Karen Cox on 03/02/20 at  2:00 PM EDT by a video enabled telemedicine application and verified that I am speaking with the correct person using two identifiers.   I discussed the limitations of evaluation and management by telemedicine and the availability of in person appointments. The patient expressed understanding and agreed to proceed.   I discussed the assessment and treatment plan with the patient. The patient was provided an opportunity to ask questions and all were answered. The patient agreed with the plan and demonstrated an understanding of the instructions.   The patient was advised to call back or seek an in-person evaluation if the symptoms worsen or if the condition fails to improve as anticipated.  [facility] MD OP Progress Note  03/02/2020 5:18 PM CHERRISE SHEDLOCK  MRN:  [id number]  Chief Complaint:  Chief Complaint    Follow-up     HPI: Karen Cox is a 59 year old African-American female, lives in [place], has a history of generalized anxiety disorder, episodic mood disorder, depressive disorder unspecified, morbid obesity, seasonal allergies, insomnia was evaluated by telemedicine today.  Patient today reports she is currently making progress with regards to her mood swings.  She denies any significant irritability.  She reports her hallucinations as improved.  She has not had any auditory or visual hallucinations in more than a week now.  She reports she is compliant on the higher dosage of Seroquel well.  She denies side effects.  She reports she continues to have some anxiety about going into public places like [place].  She reports she is currently working with her therapist on the same.  She had 3 appointments already and it is going well.  She is planning to get her driver's license and has upcoming classes scheduled for the same.  Patient reports  sleep as improved.  Patient denies any suicidality or homicidality.  Patient denies any other concerns today.   Visit Diagnosis:    ICD-10-CM   1. Schizoaffective disorder, depressive type ([place])  F25.1 PARoxetine (PAXIL) 20 MG tablet    QUEtiapine (SEROQUEL XR) 50 MG TB24 24 hr tablet  2. GAD (generalized anxiety disorder)  F41.1 buPROPion (WELLBUTRIN XL) 300 MG 24 hr tablet  3. Borderline intellectual functioning  R41.83     Past Psychiatric History: I have reviewed past psychiatric history from my progress note on 12/09/2019.  Past trials of BuSpar, hydroxyzine, melatonin, Paxil, Wellbutrin  Past Medical History:  Past Medical History:  Diagnosis Date  . Allergy   . Anxiety   . Cervical high risk HPV (human papillomavirus) test positive 05/17/2014  . Depression   . Eczema   . GERD (gastroesophageal reflux disease)   . Hot flashes 09/01/2013  . Hyperlipidemia   . Mild mental slowing   . Peri-menopause 08/05/2013  . PMB (postmenopausal bleeding) 11/16/2013  . Urticaria   . Vaginal itching 08/05/2013  . Yeast infection 08/05/2013    Past Surgical History:  Procedure Laterality Date  . CERVICAL POLYPECTOMY N/A 01/06/2014   Procedure: CERVICAL POLYPECTOMY;  Surgeon: Florian Buff, MD;  Location: [facility];  Service: Gynecology;  Laterality: N/A;  . COLONOSCOPY  09/19/2012   Procedure: COLONOSCOPY;  Surgeon: Danie Binder, MD;  Location[facility];  Service: Endoscopy;  Laterality: N/A;  8:30 AM  . DILITATION & CURRETTAGE/HYSTROSCOPY WITH THERMACHOICE ABLATION N/A 01/06/2014   Procedure: DILATATION & CURETTAGE/HYSTEROSCOPY WITH THERMACHOICE ABLATION;  Surgeon: Florian Buff, MD;  Location: [facility];  Service: Gynecology;  Laterality: N/A;  total therapy time:  9 minutes 12 seconds ,     temperature:87 degrees  . NO PAST SURGERIES      Family Psychiatric History: I have reviewed family psychiatric history from my progress note on 12/09/2019  Family History:  Family History  Problem  Relation Age of Onset  . Diabetes Father   . Heart disease Father   . Hypertension Father   . Hyperlipidemia Father   . Diabetes Maternal Uncle   . Anemia Maternal Uncle   . Arthritis Maternal Uncle   . Anxiety disorder Maternal Uncle   . Arthritis Maternal Grandmother   . Anxiety disorder Maternal Grandmother   . Heart disease Maternal Grandfather        pacemaker  . Anxiety disorder Daughter   . Colon cancer Neg Hx   . Cancer Neg Hx   . Allergic rhinitis Neg Hx   . Angioedema Neg Hx   . Asthma Neg Hx   . Eczema Neg Hx   . Immunodeficiency Neg Hx   . Urticaria Neg Hx   . Atopy Neg Hx     Social History: I have reviewed social history from my progress note on 12/09/2019 Social History   Socioeconomic History  . Marital status: Single    Spouse name: Not on file  . Number of children: Not on file  . Years of education: Not on file  . Highest education level: Not on file  Occupational History  . Not on file  Tobacco Use  . Smoking status: Former Smoker    Packs/day: 0.25    Years: 3.00    Pack years: 0.75    Types: Cigarettes    Quit date: 09/03/1981    Years since quitting: 38.5  . Smokeless tobacco: Never Used  Substance and Sexual Activity  . Alcohol use: No  . Drug use: No  . Sexual activity: Never    Birth control/protection: Post-menopausal  Other Topics Concern  . Not on file  Social History Narrative  . Not on file   Social Determinants of Health   Financial Resource Strain:   . Difficulty of Paying Living Expenses:   Food Insecurity:   . Worried About Charity fundraiser in the Last Year:   . Arboriculturist in the Last Year:   Transportation Needs:   . Film/video editor (Medical):   Marland Kitchen Lack of Transportation (Non-Medical):   Physical Activity:   . Days of Exercise per Week:   . Minutes of Exercise per Session:   Stress:   . Feeling of Stress :   Social Connections:   . Frequency of Communication with Friends and Family:   . Frequency  of Social Gatherings with Friends and Family:   . Attends Religious Services:   . Active Member of Clubs or Organizations:   . Attends Archivist Meetings:   Marland Kitchen Marital Status:     Allergies:  Allergies  Allergen Reactions  . Benadryl  [Diphenhydramine Hcl (Sleep)]   . Benadryl [Diphenhydramine Hcl] Other (See Comments)    Makes congestion worst   . Dextromethorphan Hbr Hives  . Robitussin (Alcohol Free) [Guaifenesin]     Makes cough worse  . Aspirin Rash and Hives    rash  . Diphenhydramine Hcl Hives and Rash    Makes congestion worst   . Latex Rash    Metabolic Disorder Labs: Lab Results  Component Value Date   HGBA1C 4.9 08/18/2013   MPG 94 08/18/2013   MPG 105 08/18/2012   No results found for: PROLACTIN Lab Results  Component Value Date   CHOL 228 (H) 10/09/2018   TRIG 102 10/09/2018   HDL 54 10/09/2018   CHOLHDL 4.2 10/09/2018   VLDL 20 08/17/2016   LDLCALC 152 (H) 10/09/2018   LDLCALC 124 (H) 03/04/2018   Lab Results  Component Value Date   TSH 2.102 08/27/2019   TSH 2.20 07/29/2019    Therapeutic Level Labs: No results found for: LITHIUM No results found for: VALPROATE No components found for:  CBMZ  Current Medications: Current Outpatient Medications  Medication Sig Dispense Refill  . buPROPion (WELLBUTRIN XL) 300 MG 24 hr tablet Take 1 tablet (300 mg total) by mouth every morning. 90 tablet 0  . cetirizine (ZYRTEC) 10 MG tablet Take 1 tablet (10 mg total) by mouth 2 (two) times daily. 60 tablet 5  . Cholecalciferol (VITAMIN D3) 125 MCG (5000 UT) CAPS Take by mouth.    . conjugated estrogens (PREMARIN) vaginal cream Use a small amount of cream 3-4x per week    . cyclobenzaprine (FLEXERIL) 5 MG tablet Take one tablet at bedtime as needed for back pain and spasm 15 tablet 0  . estrogen, conjugated,-medroxyprogesterone (PREMPRO) 0.625-2.5 MG tablet Take by mouth.    . famotidine (PEPCID) 20 MG tablet Take 1 tablet (20 mg total) by mouth  daily. 30 tablet 5  . famotidine (PEPCID) 20 MG tablet Take 1 tablet (20 mg total) by mouth 2 (two) times daily. 60 tablet 5  . fluticasone (FLONASE) 50 MCG/ACT nasal spray Place 2 sprays into both nostrils daily. 16 g 5  . hydrOXYzine (VISTARIL) 25 MG capsule Take 1 capsule (25 mg total) by mouth 3 (three) times daily as needed. For severe anxiety attacks only 90 capsule 1  . montelukast (SINGULAIR) 10 MG tablet Take 1 tablet (10 mg total) by mouth at bedtime. 30 tablet 3  . Olopatadine HCl (PAZEO) 0.7 % SOLN Place 1 drop into both eyes 1 day or 1 dose. 2.5 mL 5  . PARoxetine (PAXIL) 20 MG tablet TAKE 1 1/2 TABLET BY MOUTH DAILY. 135 tablet 0  . PREMARIN vaginal cream USE A SMALL AMOUNT OFACREAM 3-4 TIMES PER WEEK.    Marland Kitchen PREMPRO 0.625-2.5 MG tablet Take 1 tablet by mouth daily.    . QUEtiapine (SEROQUEL XR) 50 MG TB24 24 hr tablet Take 2 tablets (100 mg total) by mouth at bedtime. 180 tablet 0  . triamcinolone cream (KENALOG) 0.5 % Apply 1 application topically 2 (two) times daily. 15 g 1  . Vitamin D, Ergocalciferol, (DRISDOL) 1.25 MG (50000 UNIT) CAPS capsule TAKE 1 CAPSULE BY MOUTH ONCE A WEEK. 12 capsule 0  . OVER THE COUNTER MEDICATION Take 1 capsule by mouth daily. Natural vitamins & minerals formula for healthier hair     Current Facility-Administered Medications  Medication Dose Route Frequency Provider Last Rate Last Admin  . omalizumab Arvid Right) injection 300 mg  300 mg Subcutaneous Q28 days Valentina Shaggy, MD   300 mg at 12/31/17 1035     Musculoskeletal: Strength & Muscle Tone: [place]: normal Patient leans: N/A  Psychiatric Specialty Exam: Review of Systems  Psychiatric/Behavioral: Negative for agitation, behavioral problems, confusion, decreased concentration, dysphoric mood, hallucinations, self-injury, sleep disturbance and suicidal ideas. The patient is nervous/anxious (improving). The patient is not hyperactive.   All other systems reviewed and are  negative.  Last menstrual period 06/27/2013.There is no height or weight on file to calculate BMI.  General Appearance: Casual  Eye Contact:  Fair  Speech:  Clear and Coherent  Volume:  Normal  Mood:  Anxious improving  Affect:  Congruent  Thought Process:  Goal Directed and Descriptions of Associations: Intact  Orientation:  Full (Time, Place, and Person)  Thought Content: Logical   Suicidal Thoughts:  No  Homicidal Thoughts:  No  Memory:  Immediate;   Fair Recent;   Fair Remote;   Fair  Judgement:  Fair  Insight:  Fair  Psychomotor Activity:  Normal  Concentration:  Concentration: Fair and Attention Span: Fair  Recall:  [employer] of Knowledge: Fair  Language: Fair  Akathisia:  No  Handed:  Right  AIMS (if indicated): UTA  Assets:  Communication Skills Desire for Improvement Housing Social Support  ADL's:  Intact  Cognition: WNL  Sleep:  Fair   Screenings: GAD-7     Office Visit from 10/13/2019 in [facility] Office Visit from 09/08/2019 in [facility]  Total GAD-7 Score  7  15    PHQ2-9     Office Visit from 12/10/2019 in [facility] Office Visit from 09/08/2019 in [facility] Office Visit from 07/29/2019 in [facility] Visit from 10/09/2018 in [facility] Visit from 03/04/2018 in [facility]  PHQ-2 Total Score  0  2  0  0  0  PHQ-9 Total Score  0  9  --  --  --       Assessment and Plan: Karen Cox is a 59 year old African-American female who lives in [place], has a history of GAD, schizoaffective disorder, borderline intellectual functioning, morbid obesity was evaluated by telemedicine today.  Patient with psychosocial stressors of the pandemic, her own health problems.  Patient continues to struggle with anxiety symptoms in social situation however she is currently making progress otherwise.  She will continue to benefit from psychotherapy sessions.  Plan as noted  below.  Plan Schizoaffective disorder depressive type-improving Wellbutrin 300 mg p.o. daily Paxil 30 mg p.o. daily Seroquel extended release 100 mg p.o. nightly  GAD-improving Paxil 30 mg p.o. daily Hydroxyzine 25 mg p.o. 3 times daily as needed for anxiety attacks Seroquel extended release 100 mg p.o. nightly  Patient advised to continue CBT with her therapist Ms. Elmer Bales.   Borderline intellectual functioning-patient with history of intellectual disability unspecified-we will monitor closely  Follow-up in clinic in 2 months or sooner if needed.  I have spent atleast 20 minutes non face to face with patient today. More than 50 % of the time was spent for preparing to see the patient ( e.g., review of test, records ), ordering medications and test ,psychoeducation and supportive psychotherapy and care coordination,as well as documenting clinical information in electronic health record. This note was generated in part or whole with voice recognition software. Voice recognition is usually quite accurate but there are transcription errors that can and very often do occur. I apologize for any typographical errors that were not detected and corrected.       Ursula Alert, MD 03/02/2020, 5:18 PM

## 2020-03-15 ENCOUNTER — Telehealth: Payer: Self-pay

## 2020-03-15 NOTE — Telephone Encounter (Signed)
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

## 2020-03-15 NOTE — Telephone Encounter (Signed)
Please advise 

## 2020-03-15 NOTE — Telephone Encounter (Signed)
No I will not because of her health conditions, please let her know

## 2020-03-15 NOTE — Telephone Encounter (Signed)
Pt notified with verbal understanding  °

## 2020-03-17 ENCOUNTER — Telehealth: Payer: Self-pay

## 2020-03-17 NOTE — Telephone Encounter (Signed)
Due to her medical conditions I do nOT recommend driving , message sent yesterday about school bus also

## 2020-03-17 NOTE — Telephone Encounter (Signed)
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  And was asking me if she could drive a car?  But then said she could drive a car and truck. I advised that was not my call, I would send a message.

## 2020-03-17 NOTE — Telephone Encounter (Signed)
Pt advised of this recommendation with verbal understanding. Told her multiple times this was not recommended by Dr Moshe Cipro

## 2020-03-19 LAB — COMPLETE METABOLIC PANEL WITH GFR
AG Ratio: 1.6 (calc) (ref 1.0–2.5)
ALT: 19 U/L (ref 6–29)
AST: 18 U/L (ref 10–35)
Albumin: 4.5 g/dL (ref 3.6–5.1)
Alkaline phosphatase (APISO): 78 U/L (ref 37–153)
BUN: 13 mg/dL (ref 7–25)
CO2: 23 mmol/L (ref 20–32)
Calcium: 9.6 mg/dL (ref 8.6–10.4)
Chloride: 106 mmol/L (ref 98–110)
Creat: 0.94 mg/dL (ref 0.50–1.05)
GFR, Est African American: 78 mL/min/{1.73_m2} (ref >=60)
GFR, Est Non African American: 67 mL/min/{1.73_m2} (ref >=60)
Globulin: 2.8 g/dL (calc) (ref 1.9–3.7)
Glucose, Bld: 89 mg/dL (ref 65–99)
Potassium: 4.7 mmol/L (ref 3.5–5.3)
Sodium: 137 mmol/L (ref 135–146)
Total Bilirubin: 0.7 mg/dL (ref 0.2–1.2)
Total Protein: 7.3 g/dL (ref 6.1–8.1)

## 2020-03-19 LAB — LIPID PANEL
Cholesterol: 221 mg/dL — ABNORMAL HIGH (ref <200)
HDL: 59 mg/dL (ref >=50)
LDL Cholesterol (Calc): 134 mg/dL (calc) — ABNORMAL HIGH
Non-HDL Cholesterol (Calc): 162 mg/dL (calc) — ABNORMAL HIGH (ref <130)
Total CHOL/HDL Ratio: 3.7 (calc) (ref <5.0)
Triglycerides: 149 mg/dL (ref <150)

## 2020-03-19 LAB — CBC
HCT: 38.3 % (ref 35.0–45.0)
Hemoglobin: 12.7 g/dL (ref 11.7–15.5)
MCH: 29.1 pg (ref 27.0–33.0)
MCHC: 33.2 g/dL (ref 32.0–36.0)
MCV: 87.6 fL (ref 80.0–100.0)
MPV: 10.4 fL (ref 7.5–12.5)
Platelets: 253 10*3/uL (ref 140–400)
RBC: 4.37 10*6/uL (ref 3.80–5.10)
RDW: 12.2 % (ref 11.0–15.0)
WBC: 6.6 10*3/uL (ref 3.8–10.8)

## 2020-03-19 LAB — TSH: TSH: 1.83 mIU/L (ref 0.40–4.50)

## 2020-03-19 LAB — VITAMIN D 25 HYDROXY (VIT D DEFICIENCY, FRACTURES): Vit D, 25-Hydroxy: 39 ng/mL (ref 30–100)

## 2020-04-21 ENCOUNTER — Other Ambulatory Visit: Payer: Self-pay

## 2020-04-21 ENCOUNTER — Telehealth: Payer: Self-pay | Admitting: Family Medicine

## 2020-04-21 DIAGNOSIS — E559 Vitamin D deficiency, unspecified: Secondary | ICD-10-CM

## 2020-04-21 MED ORDER — VITAMIN D (ERGOCALCIFEROL) 1.25 MG (50000 UNIT) PO CAPS: 50000.0000 [IU] | ORAL_CAPSULE | ORAL | 1 refills | Status: DC

## 2020-04-21 NOTE — Telephone Encounter (Signed)
Med sent.

## 2020-04-21 NOTE — Telephone Encounter (Signed)
Pt needs refill on Vitamin D tablets please let pt know when its been sent thank you

## 2020-04-25 ENCOUNTER — Other Ambulatory Visit: Payer: Self-pay | Admitting: Psychiatry

## 2020-04-25 DIAGNOSIS — F251 Schizoaffective disorder, depressive type: Secondary | ICD-10-CM

## 2020-05-12 ENCOUNTER — Telehealth: Payer: Self-pay | Admitting: Allergy & Immunology

## 2020-05-12 ENCOUNTER — Telehealth: Payer: Medicaid Other | Admitting: Psychiatry

## 2020-05-12 ENCOUNTER — Other Ambulatory Visit: Payer: Self-pay

## 2020-05-12 DIAGNOSIS — R0982 Postnasal drip: Secondary | ICD-10-CM

## 2020-05-12 NOTE — Telephone Encounter (Signed)
ENT referral placed.  Salvatore Marvel, MD [facility]

## 2020-05-12 NOTE — Telephone Encounter (Signed)
Patient called and states she still has drainage in her throat. Patient states she has to eat a popsicle at night because her throat is so sore from the drainage. Patient would like a referral to an ENT. Please call patient when referral is placed.

## 2020-05-12 NOTE — Progress Notes (Deleted)
Virtual Visit via Video Note  I connected with Karen Cox on 05/12/20 at  2:30 PM EDT by a video enabled telemedicine application and verified that I am speaking with the correct person using two identifiers.   I discussed the limitations of evaluation and management by telemedicine and the availability of in person appointments. The patient expressed understanding and agreed to proceed.  History of Present Illness:    Observations/Objective:   Assessment and Plan:   Follow Up Instructions:    I discussed the assessment and treatment plan with the patient. The patient was provided an opportunity to ask questions and all were answered. The patient agreed with the plan and demonstrated an understanding of the instructions.   The patient was advised to call back or seek an in-person evaluation if the symptoms worsen or if the condition fails to improve as anticipated.  I provided *** minutes of non-face-to-face time during this encounter.   Ursula Alert, MD  [facility] MD/PA/NP OP Progress Note  05/12/2020 2:32 PM Karen Cox  MRN:  [id number]  Chief Complaint:  HPI: *** Visit Diagnosis: No diagnosis found.  Past Psychiatric History: ***  Past Medical History:  Past Medical History:  Diagnosis Date  . Allergy   . Anxiety   . Cervical high risk HPV (human papillomavirus) test positive 05/17/2014  . Depression   . Eczema   . GERD (gastroesophageal reflux disease)   . Hot flashes 09/01/2013  . Hyperlipidemia   . Mild mental slowing   . Peri-menopause 08/05/2013  . PMB (postmenopausal bleeding) 11/16/2013  . Urticaria   . Vaginal itching 08/05/2013  . Yeast infection 08/05/2013    Past Surgical History:  Procedure Laterality Date  . CERVICAL POLYPECTOMY N/A 01/06/2014   Procedure: CERVICAL POLYPECTOMY;  Surgeon: Florian Buff, MD;  Location: [facility];  Service: Gynecology;  Laterality: N/A;  . COLONOSCOPY  09/19/2012   Procedure: COLONOSCOPY;  Surgeon: Danie Binder, MD;  [facility];  Service: Endoscopy;  Laterality: N/A;  8:30 AM  . DILITATION & CURRETTAGE/HYSTROSCOPY WITH THERMACHOICE ABLATION N/A 01/06/2014   Procedure: DILATATION & CURETTAGE/HYSTEROSCOPY WITH THERMACHOICE ABLATION;  Surgeon: Florian Buff, MD;  Location: [facility];  Service: Gynecology;  Laterality: N/A;  total therapy time:  9 minutes 12 seconds ,     temperature:87 degrees  . NO PAST SURGERIES      Family Psychiatric History: ***  Family History:  Family History  Problem Relation Age of Onset  . Diabetes Father   . Heart disease Father   . Hypertension Father   . Hyperlipidemia Father   . Diabetes Maternal Uncle   . Anemia Maternal Uncle   . Arthritis Maternal Uncle   . Anxiety disorder Maternal Uncle   . Arthritis Maternal Grandmother   . Anxiety disorder Maternal Grandmother   . Heart disease Maternal Grandfather        pacemaker  . Anxiety disorder Daughter   . Colon cancer Neg Hx   . Cancer Neg Hx   . Allergic rhinitis Neg Hx   . Angioedema Neg Hx   . Asthma Neg Hx   . Eczema Neg Hx   . Immunodeficiency Neg Hx   . Urticaria Neg Hx   . Atopy Neg Hx     Social History:  Social History   Socioeconomic History  . Marital status: Single    Spouse name: Not on file  . Number of children: Not on file  . Years of education:  Not on file  . Highest education level: Not on file  Occupational History  . Not on file  Tobacco Use  . Smoking status: Former Smoker    Packs/day: 0.25    Years: 3.00    Pack years: 0.75    Types: Cigarettes    Quit date: 09/03/1981    Years since quitting: 38.7  . Smokeless tobacco: Never Used  Substance and Sexual Activity  . Alcohol use: No  . Drug use: No  . Sexual activity: Never    Birth control/protection: Post-menopausal  Other Topics Concern  . Not on file  Social History Narrative  . Not on file   Social Determinants of Health   Financial Resource Strain:   . Difficulty of Paying Living  Expenses:   Food Insecurity:   . Worried About Charity fundraiser in the Last Year:   . Arboriculturist in the Last Year:   Transportation Needs:   . Film/video editor (Medical):   Marland Kitchen Lack of Transportation (Non-Medical):   Physical Activity:   . Days of Exercise per Week:   . Minutes of Exercise per Session:   Stress:   . Feeling of Stress :   Social Connections:   . Frequency of Communication with Friends and Family:   . Frequency of Social Gatherings with Friends and Family:   . Attends Religious Services:   . Active Member of Clubs or Organizations:   . Attends Archivist Meetings:   Marland Kitchen Marital Status:     Allergies:  Allergies  Allergen Reactions  . Benadryl  [Diphenhydramine Hcl (Sleep)]   . Benadryl [Diphenhydramine Hcl] Other (See Comments)    Makes congestion worst   . Dextromethorphan Hbr Hives  . Robitussin (Alcohol Free) [Guaifenesin]     Makes cough worse  . Aspirin Rash and Hives    rash  . Diphenhydramine Hcl Hives and Rash    Makes congestion worst   . Latex Rash    Metabolic Disorder Labs: Lab Results  Component Value Date   HGBA1C 4.9 08/18/2013   MPG 94 08/18/2013   MPG 105 08/18/2012   No results found for: PROLACTIN Lab Results  Component Value Date   CHOL 221 (H) 03/18/2020   TRIG 149 03/18/2020   HDL 59 03/18/2020   CHOLHDL 3.7 03/18/2020   VLDL 20 08/17/2016   LDLCALC 134 (H) 03/18/2020   LDLCALC 152 (H) 10/09/2018   Lab Results  Component Value Date   TSH 1.83 03/18/2020   TSH 2.102 08/27/2019    Therapeutic Level Labs: No results found for: LITHIUM No results found for: VALPROATE No components found for:  CBMZ  Current Medications: Current Outpatient Medications  Medication Sig Dispense Refill  . buPROPion (WELLBUTRIN XL) 300 MG 24 hr tablet Take 1 tablet (300 mg total) by mouth every morning. 90 tablet 0  . cetirizine (ZYRTEC) 10 MG tablet Take 1 tablet (10 mg total) by mouth 2 (two) times daily. 60 tablet  5  . Cholecalciferol (VITAMIN D3) 125 MCG (5000 UT) CAPS Take by mouth.    . conjugated estrogens (PREMARIN) vaginal cream Use a small amount of cream 3-4x per week    . cyclobenzaprine (FLEXERIL) 5 MG tablet Take one tablet at bedtime as needed for back pain and spasm 15 tablet 0  . estrogen, conjugated,-medroxyprogesterone (PREMPRO) 0.625-2.5 MG tablet Take by mouth.    . famotidine (PEPCID) 20 MG tablet Take 1 tablet (20 mg total) by mouth daily. [place]  tablet 5  . famotidine (PEPCID) 20 MG tablet Take 1 tablet (20 mg total) by mouth 2 (two) times daily. 60 tablet 5  . fluticasone (FLONASE) 50 MCG/ACT nasal spray Place 2 sprays into both nostrils daily. 16 g 5  . hydrOXYzine (VISTARIL) 25 MG capsule Take 1 capsule (25 mg total) by mouth 3 (three) times daily as needed. For severe anxiety attacks only 90 capsule 1  . montelukast (SINGULAIR) 10 MG tablet Take 1 tablet (10 mg total) by mouth at bedtime. 30 tablet 3  . Olopatadine HCl (PAZEO) 0.7 % SOLN Place 1 drop into both eyes 1 day or 1 dose. 2.5 mL 5  . OVER THE COUNTER MEDICATION Take 1 capsule by mouth daily. Natural vitamins & minerals formula for healthier hair    . PARoxetine (PAXIL) 20 MG tablet TAKE 1 1/2 TABLET BY MOUTH DAILY. 135 tablet 0  . PREMARIN vaginal cream USE A SMALL AMOUNT OFACREAM 3-4 TIMES PER WEEK.    Marland Kitchen PREMPRO 0.625-2.5 MG tablet Take 1 tablet by mouth daily.    . QUEtiapine (SEROQUEL XR) 50 MG TB24 24 hr tablet Take 2 tablets (100 mg total) by mouth at bedtime. 180 tablet 0  . triamcinolone cream (KENALOG) 0.5 % Apply 1 application topically 2 (two) times daily. 15 g 1  . Vitamin D, Ergocalciferol, (DRISDOL) 1.25 MG (50000 UNIT) CAPS capsule Take 1 capsule (50,000 Units total) by mouth once a week. 12 capsule 1   Current Facility-Administered Medications  Medication Dose Route Frequency Provider Last Rate Last Admin  . omalizumab Arvid Right) injection 300 mg  300 mg Subcutaneous Q28 days Valentina Shaggy, MD   300 mg  at 12/31/17 1035     Musculoskeletal: Strength & Muscle Tone: {desc; muscle tone:32375} Gait & Station: {PE GAIT ED YCXK:48185} Patient leans: {Patient Leans:21022755}  Psychiatric Specialty Exam: Review of Systems  Last menstrual period 06/27/2013.There is no height or weight on file to calculate BMI.  General Appearance: {Appearance:22683}  Eye Contact:  {BHH EYE CONTACT:22684}  Speech:  {Speech:22685}  Volume:  {Volume (PAA):22686}  Mood:  {BHH MOOD:22306}  Affect:  {Affect (PAA):22687}  Thought Process:  {Thought Process (PAA):22688}  Orientation:  {BHH ORIENTATION (PAA):22689}  Thought Content: {Thought Content:22690}   Suicidal Thoughts:  {ST/HT (PAA):22692}  Homicidal Thoughts:  {ST/HT (PAA):22692}  Memory:  {BHH MEMORY:22881}  Judgement:  {Judgement (PAA):22694}  Insight:  {Insight (PAA):22695}  Psychomotor Activity:  {Psychomotor (PAA):22696}  Concentration:  {Concentration:21399}  Recall:  {BHH GOOD/FAIR/POOR:22877}  Fund of Knowledge: {BHH GOOD/FAIR/POOR:22877}  Language: {BHH GOOD/FAIR/POOR:22877}  Akathisia:  {BHH YES OR NO:22294}  Handed:  {Handed:22697}  AIMS (if indicated): {Desc; done/not:10129}  Assets:  {Assets (PAA):22698}  ADL's:  {BHH UDJ'S:97026}  Cognition: {chl bhh cognition:304700322}  Sleep:  {BHH GOOD/FAIR/POOR:22877}   Screenings: GAD-7     Office Visit from 10/13/2019 in [facility] Office Visit from 09/08/2019 in [facility]  Total GAD-7 Score 7 15    PHQ2-9     Office Visit from 12/10/2019 in [facility] Office Visit from 09/08/2019 in [facility] Office Visit from 07/29/2019 in [facility] Office Visit from 10/09/2018 in [facility] Office Visit from 03/04/2018 in [facility]  PHQ-2 Total Score 0 2 0 0 0  PHQ-9 Total Score 0 9 -- -- --       Assessment and Plan: ***   Ursula Alert, MD 05/12/2020, 2:32 PM

## 2020-05-13 ENCOUNTER — Other Ambulatory Visit: Payer: Self-pay | Admitting: Psychiatry

## 2020-05-13 DIAGNOSIS — F411 Generalized anxiety disorder: Secondary | ICD-10-CM

## 2020-05-13 DIAGNOSIS — F259 Schizoaffective disorder, unspecified: Secondary | ICD-10-CM

## 2020-05-13 DIAGNOSIS — F251 Schizoaffective disorder, depressive type: Secondary | ICD-10-CM

## 2020-05-13 MED ORDER — HYDROXYZINE PAMOATE 25 MG PO CAPS: 25.0000 mg | ORAL_CAPSULE | Freq: Three times a day (TID) | ORAL | 1 refills | Status: DC | PRN

## 2020-05-13 MED ORDER — PAROXETINE HCL 20 MG PO TABS: ORAL_TABLET | ORAL | 0 refills | Status: DC

## 2020-05-13 MED ORDER — QUETIAPINE FUMARATE ER 50 MG PO TB24: 100.0000 mg | ORAL_TABLET | Freq: Every day | ORAL | 0 refills | Status: DC

## 2020-05-13 MED ORDER — BUPROPION HCL ER (XL) 300 MG PO TB24: 300.0000 mg | ORAL_TABLET | ORAL | 0 refills | Status: DC

## 2020-05-13 NOTE — Telephone Encounter (Signed)
Patient informed and will call their office on Monday to schedule.  Thanks

## 2020-05-13 NOTE — Telephone Encounter (Signed)
Patient missed appointment yesterday however called back later stating that she had an Internet connection problem and needs 3-month supply of medication sent over.  I have sent 90-day supply of all her medications.  However patient needs to reschedule an appointment within the next 3 months.

## 2020-05-16 NOTE — Telephone Encounter (Signed)
Patient states that Dr. Lucia Gaskins does not take Medicaid and that she needs to be referred to [facility]. Please inform patient so she can call and schedule.   Please advise.

## 2020-05-16 NOTE — Telephone Encounter (Signed)
Patients referral has been placed to [facility] For review. Patient informed and states she will give their office a call later this week.

## 2020-06-15 ENCOUNTER — Other Ambulatory Visit: Payer: Self-pay

## 2020-06-15 ENCOUNTER — Telehealth (INDEPENDENT_AMBULATORY_CARE_PROVIDER_SITE_OTHER): Payer: Medicaid Other | Admitting: Psychiatry

## 2020-06-15 DIAGNOSIS — Z5329 Procedure and treatment not carried out because of patient's decision for other reasons: Secondary | ICD-10-CM

## 2020-06-15 NOTE — Progress Notes (Signed)
No response to call or text or video invite  

## 2020-06-16 ENCOUNTER — Telehealth (INDEPENDENT_AMBULATORY_CARE_PROVIDER_SITE_OTHER): Payer: Medicaid Other | Admitting: Psychiatry

## 2020-06-16 ENCOUNTER — Encounter: Payer: Self-pay | Admitting: Psychiatry

## 2020-06-16 DIAGNOSIS — F251 Schizoaffective disorder, depressive type: Secondary | ICD-10-CM | POA: Diagnosis not present

## 2020-06-16 DIAGNOSIS — F411 Generalized anxiety disorder: Secondary | ICD-10-CM | POA: Diagnosis not present

## 2020-06-16 DIAGNOSIS — R4183 Borderline intellectual functioning: Secondary | ICD-10-CM

## 2020-06-16 NOTE — Progress Notes (Signed)
Provider Location : [facility] Patient Location : Home  Participants: Patient , Provider  Virtual Visit via Video Note  I connected with Karen Cox on 06/16/20 at 11:30 AM EDT by a video enabled telemedicine application and verified that I am speaking with the correct person using two identifiers.   I discussed the limitations of evaluation and management by telemedicine and the availability of in person appointments. The patient expressed understanding and agreed to proceed.    I discussed the assessment and treatment plan with the patient. The patient was provided an opportunity to ask questions and all were answered. The patient agreed with the plan and demonstrated an understanding of the instructions.   The patient was advised to call back or seek an in-person evaluation if the symptoms worsen or if the condition fails to improve as anticipated.   [place] MD OP Progress Note  06/16/2020 11:46 AM Karen Cox  MRN:  [id number]  Chief Complaint:  Chief Complaint    Follow-up     HPI: Karen Cox is a 59 year old African-American female, lives in [place], has a history of GAD, schizoaffective disorder, borderline intellectual functioning, morbid obesity, seasonal allergies, insomnia was evaluated by telemedicine today.  Patient today reports she is currently doing well on the current medication regimen.  She denies any mood swings or depression or anxiety symptoms.  She denies any side effects to medications.  She reports sleep continues to be good.  Patient denies any appetite changes.  She reports she is planning to start psychotherapy sessions with a [facility].  She reports her therapy sessions are going to be done virtually for now.  She denies any other concerns today.  Visit Diagnosis:    ICD-10-CM   1. Schizoaffective disorder, depressive type ([place])  F25.1   2. GAD (generalized anxiety disorder)  F41.1   3. Borderline  intellectual functioning  R41.83     Past Psychiatric History: I have reviewed past psychiatric history from my progress note on 12/09/2019.  Past trials of BuSpar, hydroxyzine, melatonin, Paxil, Wellbutrin  Past Medical History:  Past Medical History:  Diagnosis Date  . Allergy   . Anxiety   . Cervical high risk HPV (human papillomavirus) test positive 05/17/2014  . Depression   . Eczema   . GERD (gastroesophageal reflux disease)   . Hot flashes 09/01/2013  . Hyperlipidemia   . Mild mental slowing   . Peri-menopause 08/05/2013  . PMB (postmenopausal bleeding) 11/16/2013  . Urticaria   . Vaginal itching 08/05/2013  . Yeast infection 08/05/2013    Past Surgical History:  Procedure Laterality Date  . CERVICAL POLYPECTOMY N/A 01/06/2014   Procedure: CERVICAL POLYPECTOMY;  Surgeon: Florian Buff, MD;  Location: [facility];  Service: Gynecology;  Laterality: N/A;  . COLONOSCOPY  09/19/2012   Procedure: COLONOSCOPY;  Surgeon: Danie Binder, MD;  Location: [facility] END[facility] SUITE;  Service: Endoscopy;  Laterality: N/A;  8:30 AM  . DILITATION & CURRETTAGE/HYSTROSCOPY WITH THERMACHOICE ABLATION N/A 01/06/2014   Procedure: DILATATION & CURETTAGE/HYSTEROSCOPY WITH THERMACHOICE ABLATION;  Surgeon: Florian Buff, MD;  Location: [facility];  Service: Gynecology;  Laterality: N/A;  total therapy time:  9 minutes 12 seconds ,     temperature:87 degrees  . NO PAST SURGERIES      Family Psychiatric History: I have reviewed family psychiatric history from my progress note on 12/09/2019  Family History:  Family History  Problem Relation Age of Onset  . Diabetes Father   .  Heart disease Father   . Hypertension Father   . Hyperlipidemia Father   . Diabetes Maternal Uncle   . Anemia Maternal Uncle   . Arthritis Maternal Uncle   . Anxiety disorder Maternal Uncle   . Arthritis Maternal Grandmother   . Anxiety disorder Maternal Grandmother   . Heart disease Maternal Grandfather        pacemaker  . Anxiety disorder  Daughter   . Colon cancer Neg Hx   . Cancer Neg Hx   . Allergic rhinitis Neg Hx   . Angioedema Neg Hx   . Asthma Neg Hx   . Eczema Neg Hx   . Immunodeficiency Neg Hx   . Urticaria Neg Hx   . Atopy Neg Hx     Social History: Reviewed social history from my progress note on 12/09/2019 Social History   Socioeconomic History  . Marital status: Single    Spouse name: Not on file  . Number of children: Not on file  . Years of education: Not on file  . Highest education level: Not on file  Occupational History  . Not on file  Tobacco Use  . Smoking status: Former Smoker    Packs/day: 0.25    Years: 3.00    Pack years: 0.75    Types: Cigarettes    Quit date: 09/03/1981    Years since quitting: 38.8  . Smokeless tobacco: Never Used  Substance and Sexual Activity  . Alcohol use: No  . Drug use: No  . Sexual activity: Never    Birth control/protection: Post-menopausal  Other Topics Concern  . Not on file  Social History Narrative  . Not on file   Social Determinants of Health   Financial Resource Strain:   . Difficulty of Paying Living Expenses: Not on file  Food Insecurity:   . Worried About Charity fundraiser in the Last Year: Not on file  . Ran Out of Food in the Last Year: Not on file  Transportation Needs:   . Lack of Transportation (Medical): Not on file  . Lack of Transportation (Non-Medical): Not on file  Physical Activity:   . Days of Exercise per Week: Not on file  . Minutes of Exercise per Session: Not on file  Stress:   . Feeling of Stress : Not on file  Social Connections:   . Frequency of Communication with Friends and Family: Not on file  . Frequency of Social Gatherings with Friends and Family: Not on file  . Attends Religious Services: Not on file  . Active Member of Clubs or Organizations: Not on file  . Attends Archivist Meetings: Not on file  . Marital Status: Not on file    Allergies:  Allergies  Allergen Reactions  . Benadryl   [Diphenhydramine Hcl (Sleep)]   . Benadryl [Diphenhydramine Hcl] Other (See Comments)    Makes congestion worst   . Dextromethorphan Hbr Hives  . Robitussin (Alcohol Free) [Guaifenesin]     Makes cough worse  . Aspirin Rash and Hives    rash  . Diphenhydramine Hcl Hives and Rash    Makes congestion worst   . Latex Rash    Metabolic Disorder Labs: Lab Results  Component Value Date   HGBA1C 4.9 08/18/2013   MPG 94 08/18/2013   MPG 105 08/18/2012   No results found for: PROLACTIN Lab Results  Component Value Date   CHOL 221 (H) 03/18/2020   TRIG 149 03/18/2020   HDL 59 03/18/2020  CHOLHDL 3.7 03/18/2020   VLDL 20 08/17/2016   LDLCALC 134 (H) 03/18/2020   LDLCALC 152 (H) 10/09/2018   Lab Results  Component Value Date   TSH 1.83 03/18/2020   TSH 2.102 08/27/2019    Therapeutic Level Labs: No results found for: LITHIUM No results found for: VALPROATE No components found for:  CBMZ  Current Medications: Current Outpatient Medications  Medication Sig Dispense Refill  . buPROPion (WELLBUTRIN XL) 300 MG 24 hr tablet Take 1 tablet (300 mg total) by mouth every morning. 90 tablet 0  . cetirizine (ZYRTEC) 10 MG tablet Take 1 tablet (10 mg total) by mouth 2 (two) times daily. 60 tablet 5  . Cholecalciferol (VITAMIN D3) 125 MCG (5000 UT) CAPS Take by mouth.    . conjugated estrogens (PREMARIN) vaginal cream Use a small amount of cream 3-4x per week    . cyclobenzaprine (FLEXERIL) 5 MG tablet Take one tablet at bedtime as needed for back pain and spasm 15 tablet 0  . estrogen, conjugated,-medroxyprogesterone (PREMPRO) 0.625-2.5 MG tablet Take by mouth.    . famotidine (PEPCID) 20 MG tablet Take 1 tablet (20 mg total) by mouth daily. 30 tablet 5  . famotidine (PEPCID) 20 MG tablet Take 1 tablet (20 mg total) by mouth 2 (two) times daily. 60 tablet 5  . fluticasone (FLONASE) 50 MCG/ACT nasal spray Place 2 sprays into both nostrils daily. 16 g 5  . hydrOXYzine (VISTARIL) 25 MG  capsule Take 1 capsule (25 mg total) by mouth 3 (three) times daily as needed. For severe anxiety attacks only 90 capsule 1  . montelukast (SINGULAIR) 10 MG tablet Take 1 tablet (10 mg total) by mouth at bedtime. 30 tablet 3  . Olopatadine HCl (PAZEO) 0.7 % SOLN Place 1 drop into both eyes 1 day or 1 dose. 2.5 mL 5  . OVER THE COUNTER MEDICATION Take 1 capsule by mouth daily. Natural vitamins & minerals formula for healthier hair    . PARoxetine (PAXIL) 20 MG tablet TAKE 1 1/2 TABLET BY MOUTH DAILY. 135 tablet 0  . PREMARIN vaginal cream USE A SMALL AMOUNT OFACREAM 3-4 TIMES PER WEEK.    Marland Kitchen PREMPRO 0.625-2.5 MG tablet Take 1 tablet by mouth daily.    . QUEtiapine (SEROQUEL XR) 50 MG TB24 24 hr tablet Take 2 tablets (100 mg total) by mouth at bedtime. 180 tablet 0  . triamcinolone cream (KENALOG) 0.5 % Apply 1 application topically 2 (two) times daily. 15 g 1  . Vitamin D, Ergocalciferol, (DRISDOL) 1.25 MG (50000 UNIT) CAPS capsule Take 1 capsule (50,000 Units total) by mouth once a week. 12 capsule 1   Current Facility-Administered Medications  Medication Dose Route Frequency Provider Last Rate Last Admin  . omalizumab Arvid Right) injection 300 mg  300 mg Subcutaneous Q28 days Valentina Shaggy, MD   300 mg at 12/31/17 1035     Musculoskeletal: Strength & Muscle Tone: [place]: normal Patient leans: N/A  Psychiatric Specialty Exam: Review of Systems  Psychiatric/Behavioral: Negative for agitation, behavioral problems, confusion, decreased concentration, dysphoric mood, hallucinations, self-injury, sleep disturbance and suicidal ideas. The patient is not nervous/anxious and is not hyperactive.   All other systems reviewed and are negative.   Last menstrual period 06/27/2013.There is no height or weight on file to calculate BMI.  General Appearance: Casual  Eye Contact:  Fair  Speech:  Clear and Coherent  Volume:  Normal  Mood:  Euthymic  Affect:  Congruent  Thought Process:  Goal Directed and Descriptions of Associations: Intact  Orientation:  Full (Time, Place, and Person)  Thought Content: Logical   Suicidal Thoughts:  No  Homicidal Thoughts:  No  Memory:  Immediate;   Fair Recent;   Fair Remote;   Fair  Judgement:  Fair  Insight:  Fair  Psychomotor Activity:  Normal  Concentration:  Concentration: Fair and Attention Span: Fair  Recall:  [employer] of Knowledge: Fair  Language: Fair  Akathisia:  No  Handed:  Right  AIMS (if indicated): UTA  Assets:  Communication Skills Desire for Improvement Housing Social Support  ADL's:  Intact  Cognition: WNL  Sleep:  Fair   Screenings: GAD-7     Office Visit from 10/13/2019 in [facility] Office Visit from 09/08/2019 in [facility]  Total GAD-7 Score 7 15    PHQ2-9     Office Visit from 12/10/2019 in [facility] Office Visit from 09/08/2019 in [facility] Office Visit from 07/29/2019 in [facility] Visit from 10/09/2018 in [facility] Visit from 03/04/2018 in [facility]  PHQ-2 Total Score 0 2 0 0 0  PHQ-9 Total Score 0 9 -- -- --       Assessment and Plan: Karen Cox is a 59 year old African-American female who lives in [place], has a history of GAD, schizoaffective disorder, borderline intellectual functioning, morbid obesity was evaluated by telemedicine today.  Patient with psychosocial stressors of the pandemic, her own health problems.  Patient however is currently doing well on the current medication regimen.  Plan as noted below.   Plan Schizoaffective disorder-stable Wellbutrin 300 mg p.o. daily Paxil 30 mg p.o. daily Seroquel extended release 100 mg p.o. nightly  GAD-stable Paxil 30 mg p.o. daily Hydroxyzine 25 mg p.o. 3 times daily as needed for anxiety attacks Seroquel as prescribed  Patient to continue psychotherapy sessions with [facility].  Patient has completed  TSH-03/18/2020-within normal limits, lipid panel-abnormal, CMP-within normal limits. Pending labs-hemoglobin A1c, prolactin.  Patient to get it done.  Follow-up in clinic in 3 months or sooner if needed.  I have spent atleast 20 minutes non face to face with patient today. More than 50 % of the time was spent for preparing to see the patient ( e.g., review of test, records ), obtaining and to review and separately obtained history , ordering medications and test ,psychoeducation and supportive psychotherapy and care coordination,as well as documenting clinical information in electronic health record. This note was generated in part or whole with voice recognition software. Voice recognition is usually quite accurate but there are transcription errors that can and very often do occur. I apologize for any typographical errors that were not detected and corrected.       Ursula Alert, MD 06/16/2020, 11:46 AM

## 2020-07-04 ENCOUNTER — Telehealth: Payer: Self-pay | Admitting: Family Medicine

## 2020-07-04 NOTE — Telephone Encounter (Signed)
Patient called requesting a prescription for a low dose steroid she is wanting one that will help with allergies phone is [phone]

## 2020-07-04 NOTE — Telephone Encounter (Signed)
Yes visit, may be virtual

## 2020-07-04 NOTE — Telephone Encounter (Signed)
Please advise. Do you want her scheduled for an ov?

## 2020-07-05 NOTE — Telephone Encounter (Signed)
Called pt to offer her appt she stated that she wanted the steroids for body building not allergies and if she needed an appt she would just wait until her nov appt

## 2020-07-11 ENCOUNTER — Telehealth: Payer: Self-pay

## 2020-07-11 NOTE — Telephone Encounter (Signed)
Pt would like to go [facility]  Pt did not have the Drs name or number.  Pt will call back

## 2020-07-14 ENCOUNTER — Ambulatory Visit (HOSPITAL_COMMUNITY): Payer: Medicaid Other

## 2020-07-25 ENCOUNTER — Other Ambulatory Visit: Payer: Self-pay | Admitting: Psychiatry

## 2020-07-25 ENCOUNTER — Telehealth: Payer: Self-pay | Admitting: Family Medicine

## 2020-07-25 DIAGNOSIS — F251 Schizoaffective disorder, depressive type: Secondary | ICD-10-CM

## 2020-07-25 NOTE — Telephone Encounter (Signed)
Patient requesting a referral to [facility] dr Anderson Malta brooks for burning in her throat

## 2020-07-26 NOTE — Telephone Encounter (Signed)
Pt will need an appt with any provider over the phone before this can be sent

## 2020-08-04 ENCOUNTER — Ambulatory Visit (HOSPITAL_COMMUNITY): Payer: Medicaid Other

## 2020-08-11 ENCOUNTER — Other Ambulatory Visit: Payer: Self-pay

## 2020-08-11 ENCOUNTER — Encounter: Payer: Self-pay | Admitting: Family Medicine

## 2020-08-11 ENCOUNTER — Telehealth (INDEPENDENT_AMBULATORY_CARE_PROVIDER_SITE_OTHER): Payer: Medicaid Other | Admitting: Family Medicine

## 2020-08-11 DIAGNOSIS — K219 Gastro-esophageal reflux disease without esophagitis: Secondary | ICD-10-CM

## 2020-08-11 DIAGNOSIS — E785 Hyperlipidemia, unspecified: Secondary | ICD-10-CM | POA: Diagnosis not present

## 2020-08-11 MED ORDER — FAMOTIDINE 20 MG PO TABS: 20.0000 mg | ORAL_TABLET | Freq: Two times a day (BID) | ORAL | 3 refills | Status: DC

## 2020-08-11 NOTE — Progress Notes (Signed)
Virtual Visit via Telephone Note  I connected with Karen Cox on 08/11/20 at  2:40 PM EDT by telephone and verified that I am speaking with the correct person using two identifiers.  Location: Patient: home Provider: home office   I discussed the limitations, risks, security and privacy concerns of performing an evaluation and management service by telephone and the availability of in person appointments. I also discussed with the patient that there may be a patient responsible charge related to this service. The patient expressed understanding and agreed to proceed.   History of Present Illness: C/o sore throat and heartburn, states her a;llegist is recommending further evaluation. Doing better since starting pepcid but needs refill. Denies dysphagia,  Denies uncontrolled allergy symptoms    Observations/Objective: Good communication with no confusion and intact memory. Alert and oriented x 3 No signs of respiratory distress during speech    Assessment and Plan: GERD (gastroesophageal reflux disease) C/o sore throat and heartburn responding to pepcid , continue same and refer to GI  Hyperlipidemia LDL goal <100 Hyperlipidemia:Low fat diet discussed and encouraged.   Lipid Panel  Lab Results  Component Value Date   CHOL 221 (H) 03/18/2020   HDL 59 03/18/2020   LDLCALC 134 (H) 03/18/2020   TRIG 149 03/18/2020   CHOLHDL 3.7 03/18/2020  needs to reduce fried and fatty foods     Morbid obesity ([place])  Patient re-educated about  the importance of commitment to a  minimum of 150 minutes of exercise per week as able.  The importance of healthy food choices with portion control discussed, as well as eating regularly and within a 12 hour window most days. The need to choose "clean , green" food 50 to 75% of the time is discussed, as well as to make water the primary drink and set a goal of 64 ounces water daily.    Weight /BMI 02/26/2020 12/10/2019 10/13/2019  WEIGHT  234 lb 207 lb 207 lb  HEIGHT 5' 3.5" 5\' 0"  5\' 0"   BMI 40.8 kg/m2 40.43 kg/m2 40.43 kg/m2        Follow Up Instructions:    I discussed the assessment and treatment plan with the patient. The patient was provided an opportunity to ask questions and all were answered. The patient agreed with the plan and demonstrated an understanding of the instructions.   The patient was advised to call back or seek an in-person evaluation if the symptoms worsen or if the condition fails to improve as anticipated.  I provided 10 minutes of non-face-to-face time during this encounter.   [name], MD

## 2020-08-11 NOTE — Patient Instructions (Addendum)
F/U as before, call if you need me before, you will get your flu vaccine at that visit  Pepcid is prescribed for heartburn, and you are referred to GI  About this  It is important that you exercise regularly at least 30 minutes 5 times a week. If you develop chest pain, have severe difficulty breathing, or feel very tired, stop exercising immediately and seek medical attention     Calorie Counting for Weight Loss Calories are units of energy. Your body needs a certain amount of calories from food to keep you going throughout the day. When you eat more calories than your body needs, your body stores the extra calories as fat. When you eat fewer calories than your body needs, your body burns fat to get the energy it needs. Calorie counting means keeping track of how many calories you eat and drink each day. Calorie counting can be helpful if you need to lose weight. If you make sure to eat fewer calories than your body needs, you should lose weight. Ask your health care provider what a healthy weight is for you. For calorie counting to work, you will need to eat the right number of calories in a day in order to lose a healthy amount of weight per week. A dietitian can help you determine how many calories you need in a day and will give you suggestions on how to reach your calorie goal.  A healthy amount of weight to lose per week is usually 1-2 lb (0.5-0.9 kg). This usually means that your daily calorie intake should be reduced by 500-750 calories.  Eating 1,200 - 1,500 calories per day can help most women lose weight.  Eating 1,500 - 1,800 calories per day can help most men lose weight. What is my plan? My goal is to have __________ calories per day. If I have this many calories per day, I should lose around __________ pounds per week. What do I need to know about calorie counting? In order to meet your daily calorie goal, you will need to:  Find out how many calories are in each food you  would like to eat. Try to do this before you eat.  Decide how much of the food you plan to eat.  Write down what you ate and how many calories it had. Doing this is called keeping a food log. To successfully lose weight, it is important to balance calorie counting with a healthy lifestyle that includes regular activity. Aim for 150 minutes of moderate exercise (such as walking) or 75 minutes of vigorous exercise (such as running) each week. Where do I find calorie information?  The number of calories in a food can be found on a Nutrition Facts label. If a food does not have a Nutrition Facts label, try to look up the calories online or ask your dietitian for help. Remember that calories are listed per serving. If you choose to have more than one serving of a food, you will have to multiply the calories per serving by the amount of servings you plan to eat. For example, the label on a package of bread might say that a serving size is 1 slice and that there are 90 calories in a serving. If you eat 1 slice, you will have eaten 90 calories. If you eat 2 slices, you will have eaten 180 calories. How do I keep a food log? Immediately after each meal, record the following information in your food log:  What you  ate. Don't forget to include toppings, sauces, and other extras on the food.  How much you ate. This can be measured in cups, ounces, or number of items.  How many calories each food and drink had.  The total number of calories in the meal. Keep your food log near you, such as in a small notebook in your pocket, or use a mobile app or website. Some programs will calculate calories for you and show you how many calories you have left for the day to meet your goal. What are some calorie counting tips?   Use your calories on foods and drinks that will fill you up and not leave you hungry: ? Some examples of foods that fill you up are nuts and nut butters, vegetables, lean proteins, and  high-fiber foods like whole grains. High-fiber foods are foods with more than 5 g fiber per serving. ? Drinks such as sodas, specialty coffee drinks, alcohol, and juices have a lot of calories, yet do not fill you up.  Eat nutritious foods and avoid empty calories. Empty calories are calories you get from foods or beverages that do not have many vitamins or protein, such as candy, sweets, and soda. It is better to have a nutritious high-calorie food (such as an avocado) than a food with few nutrients (such as a bag of chips).  Know how many calories are in the foods you eat most often. This will help you calculate calorie counts faster.  Pay attention to calories in drinks. Low-calorie drinks include water and unsweetened drinks.  Pay attention to nutrition labels for "low fat" or "fat free" foods. These foods sometimes have the same amount of calories or more calories than the full fat versions. They also often have added sugar, starch, or salt, to make up for flavor that was removed with the fat.  Find a way of tracking calories that works for you. Get creative. Try different apps or programs if writing down calories does not work for you. What are some portion control tips?  Know how many calories are in a serving. This will help you know how many servings of a certain food you can have.  Use a measuring cup to measure serving sizes. You could also try weighing out portions on a kitchen scale. With time, you will be able to estimate serving sizes for some foods.  Take some time to put servings of different foods on your favorite plates, bowls, and cups so you know what a serving looks like.  Try not to eat straight from a bag or box. Doing this can lead to overeating. Put the amount you would like to eat in a cup or on a plate to make sure you are eating the right portion.  Use smaller plates, glasses, and bowls to prevent overeating.  Try not to multitask (for example, watch TV or use  your computer) while eating. If it is time to eat, sit down at a table and enjoy your food. This will help you to know when you are full. It will also help you to be aware of what you are eating and how much you are eating. What are tips for following this plan? Reading food labels  Check the calorie count compared to the serving size. The serving size may be smaller than what you are used to eating.  Check the source of the calories. Make sure the food you are eating is high in vitamins and protein and low in saturated  and trans fats. Shopping  Read nutrition labels while you shop. This will help you make healthy decisions before you decide to purchase your food.  Make a grocery list and stick to it. Cooking  Try to cook your favorite foods in a healthier way. For example, try baking instead of frying.  Use low-fat dairy products. Meal planning  Use more fruits and vegetables. Half of your plate should be fruits and vegetables.  Include lean proteins like poultry and fish. How do I count calories when eating out?  Ask for smaller portion sizes.  Consider sharing an entree and sides instead of getting your own entree.  If you get your own entree, eat only half. Ask for a box at the beginning of your meal and put the rest of your entree in it so you are not tempted to eat it.  If calories are listed on the menu, choose the lower calorie options.  Choose dishes that include vegetables, fruits, whole grains, low-fat dairy products, and lean protein.  Choose items that are boiled, broiled, grilled, or steamed. Stay away from items that are buttered, battered, fried, or served with cream sauce. Items labeled "crispy" are usually fried, unless stated otherwise.  Choose water, low-fat milk, unsweetened iced tea, or other drinks without added sugar. If you want an alcoholic beverage, choose a lower calorie option such as a glass of wine or light beer.  Ask for dressings, sauces, and  syrups on the side. These are usually high in calories, so you should limit the amount you eat.  If you want a salad, choose a garden salad and ask for grilled meats. Avoid extra toppings like bacon, cheese, or fried items. Ask for the dressing on the side, or ask for olive oil and vinegar or lemon to use as dressing.  Estimate how many servings of a food you are given. For example, a serving of cooked rice is  cup or about the size of half a baseball. Knowing serving sizes will help you be aware of how much food you are eating at restaurants. The list below tells you how big or small some common portion sizes are based on everyday objects: ? 1 oz--4 stacked dice. ? 3 oz--1 deck of cards. ? 1 tsp--1 die. ? 1 Tbsp-- a ping-pong ball. ? 2 Tbsp--1 ping-pong ball. ?  cup-- baseball. ? 1 cup--1 baseball. Summary  Calorie counting means keeping track of how many calories you eat and drink each day. If you eat fewer calories than your body needs, you should lose weight.  A healthy amount of weight to lose per week is usually 1-2 lb (0.5-0.9 kg). This usually means reducing your daily calorie intake by 500-750 calories.  The number of calories in a food can be found on a Nutrition Facts label. If a food does not have a Nutrition Facts label, try to look up the calories online or ask your dietitian for help.  Use your calories on foods and drinks that will fill you up, and not on foods and drinks that will leave you hungry.  Use smaller plates, glasses, and bowls to prevent overeating. This information is not intended to replace advice given to you by your health care provider. Make sure you discuss any questions you have with your health care provider. Document Revised: 06/13/2018 Document Reviewed: 08/24/2016 Elsevier Patient Education  [employer].

## 2020-08-13 ENCOUNTER — Encounter: Payer: Self-pay | Admitting: Family Medicine

## 2020-08-13 NOTE — Assessment & Plan Note (Signed)
  Patient re-educated about  the importance of commitment to a  minimum of 150 minutes of exercise per week as able.  The importance of healthy food choices with portion control discussed, as well as eating regularly and within a 12 hour window most days. The need to choose "clean , green" food 50 to 75% of the time is discussed, as well as to make water the primary drink and set a goal of 64 ounces water daily.    Weight /BMI 02/26/2020 12/10/2019 10/13/2019  WEIGHT 234 lb 207 lb 207 lb  HEIGHT 5' 3.5" 5\' 0"  5\' 0"   BMI 40.8 kg/m2 40.43 kg/m2 40.43 kg/m2

## 2020-08-13 NOTE — Assessment & Plan Note (Signed)
C/o sore throat and heartburn responding to pepcid , continue same and refer to GI

## 2020-08-13 NOTE — Assessment & Plan Note (Signed)
Hyperlipidemia:Low fat diet discussed and encouraged.   Lipid Panel  Lab Results  Component Value Date   CHOL 221 (H) 03/18/2020   HDL 59 03/18/2020   LDLCALC 134 (H) 03/18/2020   TRIG 149 03/18/2020   CHOLHDL 3.7 03/18/2020  needs to reduce fried and fatty foods

## 2020-08-16 ENCOUNTER — Encounter: Payer: Self-pay | Admitting: Internal Medicine

## 2020-08-22 ENCOUNTER — Telehealth (INDEPENDENT_AMBULATORY_CARE_PROVIDER_SITE_OTHER): Payer: Medicaid Other | Admitting: Family Medicine

## 2020-08-22 ENCOUNTER — Other Ambulatory Visit: Payer: Self-pay

## 2020-08-22 ENCOUNTER — Ambulatory Visit: Payer: Medicaid Other | Admitting: Family Medicine

## 2020-08-22 ENCOUNTER — Encounter: Payer: Self-pay | Admitting: Family Medicine

## 2020-08-22 VITALS — Ht 65.0 in

## 2020-08-22 DIAGNOSIS — F251 Schizoaffective disorder, depressive type: Secondary | ICD-10-CM | POA: Diagnosis not present

## 2020-08-22 DIAGNOSIS — F411 Generalized anxiety disorder: Secondary | ICD-10-CM

## 2020-08-22 DIAGNOSIS — E559 Vitamin D deficiency, unspecified: Secondary | ICD-10-CM | POA: Diagnosis not present

## 2020-08-22 DIAGNOSIS — E785 Hyperlipidemia, unspecified: Secondary | ICD-10-CM | POA: Diagnosis not present

## 2020-08-22 DIAGNOSIS — J302 Other seasonal allergic rhinitis: Secondary | ICD-10-CM

## 2020-08-22 MED ORDER — VITAMIN D (ERGOCALCIFEROL) 1.25 MG (50000 UNIT) PO CAPS: 50000.0000 [IU] | ORAL_CAPSULE | ORAL | 1 refills | Status: DC

## 2020-08-22 NOTE — Assessment & Plan Note (Signed)
Stable and managed by psychiatry

## 2020-08-22 NOTE — Assessment & Plan Note (Signed)
Hyperlipidemia:Low fat diet discussed and encouraged.   Lipid Panel  Lab Results  Component Value Date   CHOL 221 (H) 03/18/2020   HDL 59 03/18/2020   LDLCALC 134 (H) 03/18/2020   TRIG 149 03/18/2020   CHOLHDL 3.7 03/18/2020     Updated lab needed at/ before next visit.

## 2020-08-22 NOTE — Progress Notes (Signed)
Virtual Visit via Telephone Note  I connected with Karen Cox on 08/22/20 at  8:20 AM EST by telephone and verified that I am speaking with the correct person using two identifiers.  Location: Patient: home Provider: office   I discussed the limitations, risks, security and privacy concerns of performing an evaluation and management service by telephone and the availability of in person appointments. I also discussed with the patient that there may be a patient responsible charge related to this service. The patient expressed understanding and agreed to proceed.   History of Present Illness: Complains of occasional leg pain rated at a 4 but this is not constant.  Review of systems otherwise negative.   Observations/Objective: Good communication with no confusion and intact memory. Alert and oriented x 3 No signs of respiratory distress during speech  Ht 5\' 5"  (1.651 m)   LMP 06/27/2013   BMI 38.94 kg/m    Assessment and Plan:  Hyperlipidemia LDL goal <100 Hyperlipidemia:Low fat diet discussed and encouraged.   Lipid Panel  Lab Results  Component Value Date   CHOL 221 (H) 03/18/2020   HDL 59 03/18/2020   LDLCALC 134 (H) 03/18/2020   TRIG 149 03/18/2020   CHOLHDL 3.7 03/18/2020     Updated lab needed at/ before next visit.   Morbid obesity ([place])  Patient re-educated about  the importance of commitment to a  minimum of 150 minutes of exercise per week as able.  The importance of healthy food choices with portion control discussed, as well as eating regularly and within a 12 hour window most days. The need to choose "clean , green" food 50 to 75% of the time is discussed, as well as to make water the primary drink and set a goal of 64 ounces water daily.    Weight /BMI 08/22/2020 02/26/2020 12/10/2019  WEIGHT - 234 lb 207 lb  HEIGHT 5\' 5"  5' 3.5" 5\' 0"   BMI 38.94 kg/m2 40.8 kg/m2 40.43 kg/m2      Schizoaffective disorder, depressive type ([place]) Stable  and managed by psychiatry  Seasonal allergies Currently stable and reports taking 1 Singulair tablet daily not to as documented.  GAD (generalized anxiety disorder) Controlled, no change in medication    Follow Up Instructions:    I discussed the assessment and treatment plan with the patient. The patient was provided an opportunity to ask questions and all were answered. The patient agreed with the plan and demonstrated an understanding of the instructions.   The patient was advised to call back or seek an in-person evaluation if the symptoms worsen or if the condition fails to improve as anticipated.  I provided 20 minutes of non-face-to-face time during this encounter.   [name], MD

## 2020-08-22 NOTE — Assessment & Plan Note (Signed)
Controlled, no change in medication  

## 2020-08-22 NOTE — Assessment & Plan Note (Signed)
Currently stable and reports taking 1 Singulair tablet daily not to as documented.

## 2020-08-22 NOTE — Patient Instructions (Addendum)
Follow-up in office with MD in and April call if you need me sooner.  Please get both your flu vaccine and your Covid booster vaccines.  Please work on weight loss through regular eating with reduced sugar and starchy foods as well as fried and fatty foods.  Increase your intake of vegetable fruit and water.  Please commit to regular exercise for 30 minutes at least 5 days/week.  Fasting lipid panel and vit d level, third week in April.  Thanks for choosing [facility], we consider it a privelige to serve you.

## 2020-08-22 NOTE — Assessment & Plan Note (Signed)
  Patient re-educated about  the importance of commitment to a  minimum of 150 minutes of exercise per week as able.  The importance of healthy food choices with portion control discussed, as well as eating regularly and within a 12 hour window most days. The need to choose "clean , green" food 50 to 75% of the time is discussed, as well as to make water the primary drink and set a goal of 64 ounces water daily.    Weight /BMI 08/22/2020 02/26/2020 12/10/2019  WEIGHT - 234 lb 207 lb  HEIGHT 5\' 5"  5' 3.5" 5\' 0"   BMI 38.94 kg/m2 40.8 kg/m2 40.43 kg/m2

## 2020-09-09 ENCOUNTER — Ambulatory Visit (HOSPITAL_COMMUNITY): Payer: Medicaid Other

## 2020-09-14 ENCOUNTER — Ambulatory Visit (HOSPITAL_COMMUNITY): Payer: Medicaid Other

## 2020-09-15 ENCOUNTER — Ambulatory Visit (HOSPITAL_COMMUNITY)
Admission: RE | Admit: 2020-09-15 | Discharge: 2020-09-15 | Disposition: A | Discharge status: Home / Self Care | Payer: Medicaid Other | Source: Ambulatory Visit | Attending: Family Medicine | Admitting: Family Medicine

## 2020-09-15 ENCOUNTER — Other Ambulatory Visit: Payer: Self-pay

## 2020-09-15 ENCOUNTER — Telehealth (INDEPENDENT_AMBULATORY_CARE_PROVIDER_SITE_OTHER): Payer: Medicaid Other | Admitting: Psychiatry

## 2020-09-15 ENCOUNTER — Encounter: Payer: Self-pay | Admitting: Psychiatry

## 2020-09-15 DIAGNOSIS — F411 Generalized anxiety disorder: Secondary | ICD-10-CM | POA: Diagnosis not present

## 2020-09-15 DIAGNOSIS — Z1231 Encounter for screening mammogram for malignant neoplasm of breast: Secondary | ICD-10-CM | POA: Insufficient documentation

## 2020-09-15 DIAGNOSIS — R4183 Borderline intellectual functioning: Secondary | ICD-10-CM

## 2020-09-15 DIAGNOSIS — F251 Schizoaffective disorder, depressive type: Secondary | ICD-10-CM | POA: Diagnosis not present

## 2020-09-15 MED ORDER — BUPROPION HCL ER (XL) 300 MG PO TB24: 300.0000 mg | ORAL_TABLET | ORAL | 0 refills | Status: DC

## 2020-09-15 MED ORDER — PAROXETINE HCL 20 MG PO TABS: ORAL_TABLET | ORAL | 0 refills | Status: DC

## 2020-09-15 MED ORDER — HYDROXYZINE PAMOATE 25 MG PO CAPS: 25.0000 mg | ORAL_CAPSULE | Freq: Three times a day (TID) | ORAL | 1 refills | Status: DC | PRN

## 2020-09-15 MED ORDER — QUETIAPINE FUMARATE ER 50 MG PO TB24: 100.0000 mg | ORAL_TABLET | Freq: Every day | ORAL | 0 refills | Status: DC

## 2020-09-15 NOTE — Progress Notes (Signed)
Virtual Visit via Telephone Note  I connected with Karen Cox on 09/15/20 at  2:30 PM EST by telephone and verified that I am speaking with the correct person using two identifiers.  Location Provider Location : [facility] Patient Location : Home  Participants: Patient , Provider   I discussed the limitations, risks, security and privacy concerns of performing an evaluation and management service by telephone and the availability of in person appointments. I also discussed with the patient that there may be a patient responsible charge related to this service. The patient expressed understanding and agreed to proceed.   I discussed the assessment and treatment plan with the patient. The patient was provided an opportunity to ask questions and all were answered. The patient agreed with the plan and demonstrated an understanding of the instructions.   The patient was advised to call back or seek an in-person evaluation if the symptoms worsen or if the condition fails to improve as anticipated.  [place] MD OP Progress Note  09/15/2020 2:58 PM Karen Cox  MRN:  [id number]  Chief Complaint:  Chief Complaint    Follow-up     HPI: Karen Cox is a 59 year old African-American female, lives in [place] has a history of GAD, schizoaffective disorder, borderline intellectual functioning, morbid obesity, seasonal allergies, insomnia was evaluated by telemedicine today.  Patient today reports she is currently doing well.  Patient denies any suicidality, homicidality.  She does report having auditory hallucinations on and off however it does not bother her.  She reports she hears someone calling her name.  She denies any mood symptoms.  She reports appetite as fair.  Patient reports she has not started psychotherapy sessions and she is waiting for a new therapist with [facility].  Patient denies any other concerns today.  Visit Diagnosis:    ICD-10-CM   1. Schizoaffective  disorder, depressive type ([place])  F25.1 PARoxetine (PAXIL) 20 MG tablet    QUEtiapine (SEROQUEL XR) 50 MG TB24 24 hr tablet  2. GAD (generalized anxiety disorder)  F41.1 buPROPion (WELLBUTRIN XL) 300 MG 24 hr tablet    hydrOXYzine (VISTARIL) 25 MG capsule  3. Borderline intellectual functioning  R41.83     Past Psychiatric History: I have reviewed past psychiatric history from my progress note on 12/09/2019.  Past trials of BuSpar, hydroxyzine, melatonin, Paxil, Wellbutrin  Past Medical History:  Past Medical History:  Diagnosis Date  . Allergy   . Anxiety   . Cervical high risk HPV (human papillomavirus) test positive 05/17/2014  . Depression   . Eczema   . GERD (gastroesophageal reflux disease)   . Hot flashes 09/01/2013  . Hyperlipidemia   . Mild mental slowing   . Peri-menopause 08/05/2013  . PMB (postmenopausal bleeding) 11/16/2013  . Urticaria   . Vaginal itching 08/05/2013  . Yeast infection 08/05/2013    Past Surgical History:  Procedure Laterality Date  . CERVICAL POLYPECTOMY N/A 01/06/2014   Procedure: CERVICAL POLYPECTOMY;  Surgeon: Florian Buff, MD;  Location: [facility];  Service: Gynecology;  Laterality: N/A;  . COLONOSCOPY  09/19/2012   Procedure: COLONOSCOPY;  Surgeon: Danie Binder, MD;  Location: [facility];  Service: Endoscopy;  Laterality: N/A;  8:30 AM  . DILITATION & CURRETTAGE/HYSTROSCOPY WITH THERMACHOICE ABLATION N/A 01/06/2014   Procedure: DILATATION & CURETTAGE/HYSTEROSCOPY WITH THERMACHOICE ABLATION;  Surgeon: Florian Buff, MD;  Location: [facility];  Service: Gynecology;  Laterality: N/A;  total therapy time:  9 minutes 12 seconds ,  temperature:87 degrees  . NO PAST SURGERIES      Family Psychiatric History: I have reviewed family psychiatric history from my progress note on 12/09/2019  Family History:  Family History  Problem Relation Age of Onset  . Diabetes Father   . Heart disease Father   . Hypertension Father   . Hyperlipidemia Father   .  Diabetes Maternal Uncle   . Anemia Maternal Uncle   . Arthritis Maternal Uncle   . Anxiety disorder Maternal Uncle   . Arthritis Maternal Grandmother   . Anxiety disorder Maternal Grandmother   . Heart disease Maternal Grandfather        pacemaker  . Anxiety disorder Daughter   . Colon cancer Neg Hx   . Cancer Neg Hx   . Allergic rhinitis Neg Hx   . Angioedema Neg Hx   . Asthma Neg Hx   . Eczema Neg Hx   . Immunodeficiency Neg Hx   . Urticaria Neg Hx   . Atopy Neg Hx     Social History: Reviewed social history from my progress note on 12/09/2019 Social History   Socioeconomic History  . Marital status: Single    Spouse name: Not on file  . Number of children: Not on file  . Years of education: Not on file  . Highest education level: Not on file  Occupational History  . Not on file  Tobacco Use  . Smoking status: Former Smoker    Packs/day: 0.25    Years: 3.00    Pack years: 0.75    Types: Cigarettes    Quit date: 09/03/1981    Years since quitting: 39.0  . Smokeless tobacco: Never Used  Substance and Sexual Activity  . Alcohol use: No  . Drug use: No  . Sexual activity: Never    Birth control/protection: Post-menopausal  Other Topics Concern  . Not on file  Social History Narrative  . Not on file   Social Determinants of Health   Financial Resource Strain: Not on file  Food Insecurity: Not on file  Transportation Needs: Not on file  Physical Activity: Not on file  Stress: Not on file  Social Connections: Not on file    Allergies:  Allergies  Allergen Reactions  . Benadryl  [Diphenhydramine Hcl (Sleep)]   . Benadryl [Diphenhydramine Hcl] Other (See Comments)    Makes congestion worst   . Dextromethorphan Hbr Hives  . Robitussin (Alcohol Free) [Guaifenesin]     Makes cough worse  . Aspirin Rash and Hives    rash  . Diphenhydramine Hcl Hives and Rash    Makes congestion worst   . Latex Rash    Metabolic Disorder Labs: Lab Results  Component  Value Date   HGBA1C 4.9 08/18/2013   MPG 94 08/18/2013   MPG 105 08/18/2012   No results found for: PROLACTIN Lab Results  Component Value Date   CHOL 221 (H) 03/18/2020   TRIG 149 03/18/2020   HDL 59 03/18/2020   CHOLHDL 3.7 03/18/2020   VLDL 20 08/17/2016   LDLCALC 134 (H) 03/18/2020   LDLCALC 152 (H) 10/09/2018   Lab Results  Component Value Date   TSH 1.83 03/18/2020   TSH 2.102 08/27/2019    Therapeutic Level Labs: No results found for: LITHIUM No results found for: VALPROATE No components found for:  CBMZ  Current Medications: Current Outpatient Medications  Medication Sig Dispense Refill  . buPROPion (WELLBUTRIN XL) 300 MG 24 hr tablet Take 1 tablet (300 mg total)  by mouth every morning. 90 tablet 0  . cetirizine (ZYRTEC) 10 MG tablet Take 1 tablet (10 mg total) by mouth 2 (two) times daily. 60 tablet 5  . Cholecalciferol (VITAMIN D3) 125 MCG (5000 UT) CAPS Take by mouth.    . conjugated estrogens (PREMARIN) vaginal cream Use a small amount of cream 3-4x per week    . cyclobenzaprine (FLEXERIL) 5 MG tablet Take one tablet at bedtime as needed for back pain and spasm 15 tablet 0  . ESTRING 2 MG vaginal ring SMARTSIG:Ring Vaginal    . estrogen, conjugated,-medroxyprogesterone (PREMPRO) 0.625-2.5 MG tablet Take by mouth.    . famotidine (HM FAMOTIDINE) 20 MG tablet Take 1 tablet (20 mg total) by mouth 2 (two) times daily. 60 tablet 3  . fluticasone (FLONASE) 50 MCG/ACT nasal spray Place 2 sprays into both nostrils daily. 16 g 5  . hydrOXYzine (VISTARIL) 25 MG capsule Take 1 capsule (25 mg total) by mouth 3 (three) times daily as needed. For severe anxiety attacks only 90 capsule 1  . montelukast (SINGULAIR) 10 MG tablet Take 1 tablet (10 mg total) by mouth at bedtime. 30 tablet 3  . Olopatadine HCl (PAZEO) 0.7 % SOLN Place 1 drop into both eyes 1 day or 1 dose. 2.5 mL 5  . OVER THE COUNTER MEDICATION Take 1 capsule by mouth daily. Natural vitamins & minerals formula for  healthier hair    . PARoxetine (PAXIL) 20 MG tablet TAKE 1 AND 1/2 TABLET BY MOUTH DAILY 135 tablet 0  . PREMARIN vaginal cream USE A SMALL AMOUNT OFACREAM 3-4 TIMES PER WEEK.    Marland Kitchen PREMPRO 0.625-2.5 MG tablet Take 1 tablet by mouth daily.    . QUEtiapine (SEROQUEL XR) 50 MG TB24 24 hr tablet Take 2 tablets (100 mg total) by mouth at bedtime. 180 tablet 0  . triamcinolone cream (KENALOG) 0.5 % Apply 1 application topically 2 (two) times daily. 15 g 1  . Vitamin D, Ergocalciferol, (DRISDOL) 1.25 MG (50000 UNIT) CAPS capsule Take 1 capsule (50,000 Units total) by mouth once a week. 12 capsule 1   Current Facility-Administered Medications  Medication Dose Route Frequency Provider Last Rate Last Admin  . omalizumab Arvid Right) injection 300 mg  300 mg Subcutaneous Q28 days Valentina Shaggy, MD   300 mg at 12/31/17 1035     Musculoskeletal: Strength & Muscle Tone: UTA Gait & Station: UTA Patient leans: N/A  Psychiatric Specialty Exam: Review of Systems  Psychiatric/Behavioral: Positive for hallucinations.  All other systems reviewed and are negative.   Last menstrual period 06/27/2013.There is no height or weight on file to calculate BMI.  General Appearance: UTA  Eye Contact:  UTA  Speech:  Clear and Coherent  Volume:  Normal  Mood:  Euthymic  Affect:  UTA  Thought Process:  Goal Directed and Descriptions of Associations: Intact  Orientation:  Full (Time, Place, and Person)  Thought Content: Hallucinations: Auditory Hear someone calling her name - chronic  Suicidal Thoughts:  No  Homicidal Thoughts:  No  Memory:  Immediate;   Fair Recent;   Fair Remote;   Fair  Judgement:  Fair  Insight:  Fair  Psychomotor Activity:  UTA  Concentration:  Concentration: Fair and Attention Span: Fair  Recall:  [employer] of Knowledge: Fair  Language: Fair  Akathisia:  No  Handed:  Right  AIMS (if indicated): UTA  Assets:  Communication Skills Desire for Improvement Housing Social  Support  ADL's:  Intact  Cognition: WNL  Sleep:  Fair   Screenings: GAD-7   [place] Office Visit from 10/13/2019 in [facility] Office Visit from 09/08/2019 in [facility]  Total GAD-7 Score 7 15    PHQ2-9   Flowsheet Row Video Visit from 08/11/2020 in [facility] Office Visit from 12/10/2019 in [facility] Office Visit from 09/08/2019 in [facility] Office Visit from 07/29/2019 in [facility] Visit from 10/09/2018 in [facility]  PHQ-2 Total Score 1 0 2 0 0  PHQ-9 Total Score -- 0 9 -- --       Assessment and Plan: Karen Cox is a 59 year old African-American female who lives in [place], has a history of GAD, schizoaffective disorder, borderline intellectual functioning, morbid obesity was evaluated by telemedicine today.  Patient with psychosocial stressors of the pandemic, her own health problems.  Patient is currently stable on current medication regimen.  Plan as noted below.  Plan Schizoaffective disorder-stable Wellbutrin 300 mg p.o. daily Paxil 30 mg p.o. daily Seroquel extended release 100 mg p.o. nightly  GAD-stable Paxil 30 mg p.o. daily Hydroxyzine 25 mg p.o. 3 times daily as needed for anxiety attacks Seroquel as prescribed  Patient agrees to get labs done-hemoglobin A1c, prolactin.  Follow-up in clinic in 3 months or sooner if needed.  I have spent atleast 20 minutes non face to face with patient today. More than 50 % of the time was spent for preparing to see the patient ( e.g., review of test, records ), ordering medications and test ,psychoeducation and supportive psychotherapy and care coordination,as well as documenting clinical information in electronic health record. This note was generated in part or whole with voice recognition software. Voice recognition is usually quite accurate but there are transcription errors that can and very often do occur. I apologize  for any typographical errors that were not detected and corrected.     Ursula Alert, MD 09/15/2020, 2:58 PM

## 2020-09-22 ENCOUNTER — Ambulatory Visit: Payer: Medicaid Other | Admitting: Internal Medicine

## 2020-09-28 ENCOUNTER — Telehealth: Payer: Self-pay | Admitting: Family Medicine

## 2020-09-28 NOTE — Telephone Encounter (Signed)
Med refill Vitamin D tablets  [facility]  PT# [phone]

## 2020-09-29 ENCOUNTER — Other Ambulatory Visit: Payer: Self-pay | Admitting: *Deleted

## 2020-09-29 DIAGNOSIS — E559 Vitamin D deficiency, unspecified: Secondary | ICD-10-CM

## 2020-09-29 MED ORDER — VITAMIN D (ERGOCALCIFEROL) 1.25 MG (50000 UNIT) PO CAPS: 50000.0000 [IU] | ORAL_CAPSULE | ORAL | 1 refills | Status: DC

## 2020-09-29 NOTE — Telephone Encounter (Signed)
Vitamin D sent to pt pharmacy

## 2020-10-18 ENCOUNTER — Other Ambulatory Visit: Payer: Self-pay | Admitting: Allergy & Immunology

## 2020-10-18 DIAGNOSIS — J3089 Other allergic rhinitis: Secondary | ICD-10-CM

## 2020-10-20 ENCOUNTER — Telehealth (INDEPENDENT_AMBULATORY_CARE_PROVIDER_SITE_OTHER): Payer: Medicaid Other | Admitting: Family Medicine

## 2020-10-20 ENCOUNTER — Other Ambulatory Visit: Payer: Self-pay

## 2020-10-20 DIAGNOSIS — L309 Dermatitis, unspecified: Secondary | ICD-10-CM

## 2020-10-20 NOTE — Progress Notes (Signed)
Unable to contact patient after multiple, over 4 attempts at either number listed, visit is canceled, and I have requested that it be rescheduled

## 2020-10-21 ENCOUNTER — Telehealth (INDEPENDENT_AMBULATORY_CARE_PROVIDER_SITE_OTHER): Payer: Medicaid Other | Admitting: Family Medicine

## 2020-10-21 ENCOUNTER — Other Ambulatory Visit: Payer: Self-pay

## 2020-10-21 ENCOUNTER — Encounter: Payer: Self-pay | Admitting: Family Medicine

## 2020-10-21 VITALS — BP 136/107 | HR 124 | Ht 65.0 in | Wt 263.0 lb

## 2020-10-21 DIAGNOSIS — R21 Rash and other nonspecific skin eruption: Secondary | ICD-10-CM | POA: Diagnosis not present

## 2020-10-21 DIAGNOSIS — I10 Essential (primary) hypertension: Secondary | ICD-10-CM | POA: Diagnosis not present

## 2020-10-21 MED ORDER — TRIAMCINOLONE ACETONIDE 0.5 % EX CREA: 1.0000 "application " | TOPICAL_CREAM | Freq: Two times a day (BID) | CUTANEOUS | 1 refills | Status: DC

## 2020-10-21 NOTE — Assessment & Plan Note (Signed)
Needs to start medication.  She has invaded this for over a year even with Dr. Moshe Cipro.  She does not want to take medication at her last visit with me back in 2020.  Her blood pressure today reported is quite elevated.  Unsure if it was checked appropriately.  Would like to have her come into the office next week for verification of this and strongly educated her that medication might be needed she seems to be in agreements of this on the virtual visit today.  DASH diet and exercise is recommended as she is tolerant of.

## 2020-10-21 NOTE — Progress Notes (Signed)
Virtual Visit via Telephone Note   This visit type was conducted due to national recommendations for restrictions regarding the COVID-19 Pandemic (e.g. social distancing) in an effort to limit this patient's exposure and mitigate transmission in our community.  Due to her co-morbid illnesses, this patient is at least at moderate risk for complications without adequate follow up.  This format is felt to be most appropriate for this patient at this time.  The patient did not have access to video technology/had technical difficulties with video requiring transitioning to audio format only (telephone).  All issues noted in this document were discussed and addressed.  No physical exam could be performed with this format.     Evaluation Performed:  Follow-up visit  Date:  10/21/2020   ID:  Karen Cox, DOB 1961-04-28, MRN [id number]  Patient Location: Home Provider Location: Office/Clinic   Participants: Nurse/CMA for intake and work up; Patient and Provider for Visit and Wrap up  Method of visit: Telephone  Location of Patient: Home Location of Provider: Office Consent was obtain for visit over the telephone. Services rendered by provider: Visit was performed via telephone/  I verified that I am speaking with the correct person using two identifiers.  PCP:  Fayrene Helper, MD   Chief Complaint: Rash  History of Present Illness:    Karen Cox is a 60 y.o. female with history stated below.  Reports today with a onset of a rash that started Monday, January 9.  She reports is located on her thighs and legs.  It is red and when she scratches it it swells up some.  Like a hive.  She reports this is the same rash that she had back in October 2020 when I saw her in the office.  At that time we did a Depo injection and steroid cream.  She reports it got better.  She reports that she used the cream intermittently over the last year 2021.  Never followed up with a  dermatologist.  She does see an allergist is on several medications to help with allergies.  She does not report any improvement with the use of allergy medications with this rash.  She denies having any changes in her hygiene products or new foods or new clothing products. She denies having any shortness of breath, cough or chest pain associated with this.  The patient does not have symptoms concerning for COVID-19 infection (fever, chills, cough, or new shortness of breath).   Past Medical, Surgical, Social History, Allergies, and Medications have been Reviewed.  Past Medical History:  Diagnosis Date  . Allergy   . Anxiety   . Cervical high risk HPV (human papillomavirus) test positive 05/17/2014  . Depression   . Eczema   . GERD (gastroesophageal reflux disease)   . Hot flashes 09/01/2013  . Hyperlipidemia   . Mild mental slowing   . Peri-menopause 08/05/2013  . PMB (postmenopausal bleeding) 11/16/2013  . Urticaria   . Vaginal itching 08/05/2013  . Yeast infection 08/05/2013   Past Surgical History:  Procedure Laterality Date  . CERVICAL POLYPECTOMY N/A 01/06/2014   Procedure: CERVICAL POLYPECTOMY;  Surgeon: Florian Buff, MD;  Location: [facility];  Service: Gynecology;  Laterality: N/A;  . COLONOSCOPY  09/19/2012   Procedure: COLONOSCOPY;  Surgeon: Danie Binder, MD;  Location: [facility];  Service: Endoscopy;  Laterality: N/A;  8:30 AM  . DILITATION & CURRETTAGE/HYSTROSCOPY WITH THERMACHOICE ABLATION N/A 01/06/2014   Procedure: DILATATION &  CURETTAGE/HYSTEROSCOPY WITH THERMACHOICE ABLATION;  Surgeon: Florian Buff, MD;  Location: [facility];  Service: Gynecology;  Laterality: N/A;  total therapy time:  9 minutes 12 seconds ,     temperature:87 degrees  . NO PAST SURGERIES       Current Meds  Medication Sig  . buPROPion (WELLBUTRIN XL) 300 MG 24 hr tablet Take 1 tablet (300 mg total) by mouth every morning.  . cetirizine (ZYRTEC) 10 MG tablet Take 1 tablet (10 mg total) by mouth 2  (two) times daily.  . Cholecalciferol (VITAMIN D3) 125 MCG (5000 UT) CAPS Take by mouth.  . conjugated estrogens (PREMARIN) vaginal cream Use a small amount of cream 3-4x per week  . cyclobenzaprine (FLEXERIL) 5 MG tablet Take one tablet at bedtime as needed for back pain and spasm  . ESTRING 2 MG vaginal ring SMARTSIG:Ring Vaginal  . estrogen, conjugated,-medroxyprogesterone (PREMPRO) 0.625-2.5 MG tablet Take by mouth.  . famotidine (HM FAMOTIDINE) 20 MG tablet Take 1 tablet (20 mg total) by mouth 2 (two) times daily.  . fluticasone (FLONASE) 50 MCG/ACT nasal spray Place 2 sprays into both nostrils daily.  . hydrOXYzine (VISTARIL) 25 MG capsule Take 1 capsule (25 mg total) by mouth 3 (three) times daily as needed. For severe anxiety attacks only  . montelukast (SINGULAIR) 10 MG tablet Take 1 tablet (10 mg total) by mouth at bedtime.  . Olopatadine HCl (PAZEO) 0.7 % SOLN Place 1 drop into both eyes 1 day or 1 dose.  Marland Kitchen OVER THE COUNTER MEDICATION Take 1 capsule by mouth daily. Natural vitamins & minerals formula for healthier hair  . PARoxetine (PAXIL) 20 MG tablet TAKE 1 AND 1/2 TABLET BY MOUTH DAILY  . PREMARIN vaginal cream USE A SMALL AMOUNT OFACREAM 3-4 TIMES PER WEEK.  Marland Kitchen PREMPRO 0.625-2.5 MG tablet Take 1 tablet by mouth daily.  . QUEtiapine (SEROQUEL XR) 50 MG TB24 24 hr tablet Take 2 tablets (100 mg total) by mouth at bedtime.  . triamcinolone cream (KENALOG) 0.5 % Apply 1 application topically 2 (two) times daily.  . Vitamin D, Ergocalciferol, (DRISDOL) 1.25 MG (50000 UNIT) CAPS capsule Take 1 capsule (50,000 Units total) by mouth once a week.   Current Facility-Administered Medications for the 10/21/20 encounter (Video Visit) with Perlie Mayo, NP  Medication  . omalizumab Arvid Right) injection 300 mg     Allergies:   Benadryl  [diphenhydramine hcl (sleep)], Benadryl [diphenhydramine hcl], Dextromethorphan hbr, Robitussin (alcohol free) [guaifenesin], Aspirin, Diphenhydramine hcl, and  Latex   ROS:   Please see the history of present illness.    All other systems reviewed and are negative.   Labs/Other Tests and Data Reviewed:    Recent Labs: 03/18/2020: ALT 19; BUN 13; Creat 0.94; Hemoglobin 12.7; Platelets 253; Potassium 4.7; Sodium 137; TSH 1.83   Recent Lipid Panel Lab Results  Component Value Date/Time   CHOL 221 (H) 03/18/2020 10:41 AM   TRIG 149 03/18/2020 10:41 AM   HDL 59 03/18/2020 10:41 AM   CHOLHDL 3.7 03/18/2020 10:41 AM   LDLCALC 134 (H) 03/18/2020 10:41 AM    Wt Readings from Last 3 Encounters:  10/21/20 263 lb (119.3 kg)  02/26/20 234 lb (106.1 kg)  12/10/19 207 lb (93.9 kg)     Objective:    Vital Signs:  BP (!) 136/107   Pulse (!) 124   Ht 5\' 5"  (1.651 m)   Wt 263 lb (119.3 kg)   LMP 06/27/2013   BMI 43.77 kg/m  VITAL SIGNS:  reviewed GEN:  no acute distress RESPIRATORY:  No shortness of breath noted in conversation PSYCH:  normal affect-baseline for her  Limited secondary to phone visit.  In office visit to allow for visualization of rash if needed  ASSESSMENT & PLAN:    1. Rash  - triamcinolone cream (KENALOG) 0.5 %; Apply 1 application topically 2 (two) times daily.  Dispense: 15 g; Refill: 1  2. Essential hypertension   Time:   Today, I have spent 10 minutes with the patient with telehealth technology discussing the above problems.     Medication Adjustments/Labs and Tests Ordered: Current medicines are reviewed at length with the patient today.  Concerns regarding medicines are outlined above.   Tests Ordered: No orders of the defined types were placed in this encounter.   Medication Changes: No orders of the defined types were placed in this encounter.    Disposition:  Follow up 1 week in office Signed, Perlie Mayo, NP  10/21/2020 9:47 AM     [place]

## 2020-10-21 NOTE — Patient Instructions (Signed)
  I appreciate the opportunity to provide you with care for your health and wellness.  Follow up: Next week in office for BP check and Rash follow up  No labs or referrals today  Use Kenalog cream twice daily to help with rash.  We will see you next week to check your BP and the rash in person.  Please continue to practice social distancing to keep you, your family, and our community safe.  If you must go out, please wear a mask and practice good handwashing.  It was a pleasure to see you and I look forward to continuing to work together on your health and well-being. Please do not hesitate to call the office if you need care or have questions about your care.  Have a wonderful day. With Gratitude, Cherly [place], DNP, AGNP-BC

## 2020-10-21 NOTE — Assessment & Plan Note (Signed)
History of rash - treated her for it back in 07/2019. I had advised to see dermatology she requested new dermatology but never followed up again.  In that appointment I provided her with Depo-Medrol, Kenalog cream.  She reports that the cream helped.  And the steroid shot helped.  She would like to repeat that today however this is a virtual visit and unable to do that at this time.  In addition would like to see this rash in person after cream treatment to see if it is helpful or not.  I have advised for her to follow-up with a dermatologist as she reports having used the rash cream intermittently throughout the last year.  Would like to avoid long-term use of the cream if able.  Referral will be made back to [facility] dermatology as per her request.  She will follow-up in the office next week to see if the Kenalog cream is helpful.

## 2020-10-28 ENCOUNTER — Ambulatory Visit: Payer: Medicaid Other | Admitting: Nurse Practitioner

## 2020-10-28 ENCOUNTER — Other Ambulatory Visit: Payer: Self-pay

## 2020-10-28 ENCOUNTER — Encounter: Payer: Self-pay | Admitting: Nurse Practitioner

## 2020-10-28 DIAGNOSIS — T7840XA Allergy, unspecified, initial encounter: Secondary | ICD-10-CM | POA: Insufficient documentation

## 2020-10-28 DIAGNOSIS — R000 Tachycardia, unspecified: Secondary | ICD-10-CM

## 2020-10-28 DIAGNOSIS — T7840XD Allergy, unspecified, subsequent encounter: Secondary | ICD-10-CM

## 2020-10-28 MED ORDER — METHYLPREDNISOLONE ACETATE 80 MG/ML IJ SUSP
40.0000 mg | Freq: Once | INTRAMUSCULAR | Status: AC
  Administered 2020-10-28: 40 mg via INTRAMUSCULAR

## 2020-10-28 MED ORDER — PREDNISONE 10 MG PO TABS: ORAL_TABLET | ORAL | 0 refills | Status: AC

## 2020-10-28 NOTE — Addendum Note (Signed)
Addended by: Lonn Georgia on: 10/28/2020 11:13 AM   Modules accepted: Orders

## 2020-10-28 NOTE — Assessment & Plan Note (Signed)
-  IM depomedrol today 40 mg, bc she has tachycardia -Rx. PO prednisone dose pack (start at 40 instead of 60 d/t tachycardia) -denies changing anything but her shampoo, but rash is to her chest, back, legs, arms, etc; not just her hair -referred to dermatology, she has allergist referral already -will get food allergy panel as well as alpha-gal panel

## 2020-10-28 NOTE — Patient Instructions (Addendum)
For your rash, we gave you an injection of steroids and several more days of oral steroids.  We referred you to dermatology as well as drew some labs that will take a little over a week to get results.  We will meet back up in 2 weeks to review to lab work and treat tachycardia if it is still present. Return to the clinic or go to the emergency department if you have any shortness of breath or chest pain.

## 2020-10-28 NOTE — Assessment & Plan Note (Signed)
-  HR 122 today -she states she is asymptomatic -will treat her rash today and f/u in  2 weeks to see if tachycardia resolves along with rash or if it is a separate issue

## 2020-10-28 NOTE — Progress Notes (Signed)
Acute Office Visit  Subjective:    Patient ID: Karen Cox, female    DOB: 1960-12-22, 60 y.o.   MRN: [id number]  Chief Complaint  Patient presents with  . Hypertension  . Rash    X 1 week     HPI Patient is in today for BP follow-up and rash.  The rash is located on her thighs and legs. It is red and swells with scratching.  She reports this is the same rash that she had back in October 2020 when Jarrett Soho saw her in the office.  At that time we did a Depo injection and steroid cream.  She reports it got better.  She reports that she used the cream intermittently over the last year 2021.  Never followed up with a dermatologist.  She does see an allergist is on several medications to help with allergies.  She does not report any improvement with the use of allergy medications with this rash.  She denies having any changes in her hygiene products or new foods or new clothing products. She denies having any shortness of breath, cough or chest pain associated with this.  At last visit 10/22/19, she was prescribed triamcinolone cream for use BID.  She would like referral to [facility] dermatology  Past Medical History:  Diagnosis Date  . Allergy   . Anxiety   . Cervical high risk HPV (human papillomavirus) test positive 05/17/2014  . Depression   . Eczema   . GERD (gastroesophageal reflux disease)   . Hot flashes 09/01/2013  . Hyperlipidemia   . Mild mental slowing   . Peri-menopause 08/05/2013  . PMB (postmenopausal bleeding) 11/16/2013  . Urticaria   . Vaginal itching 08/05/2013  . Yeast infection 08/05/2013    Past Surgical History:  Procedure Laterality Date  . CERVICAL POLYPECTOMY N/A 01/06/2014   Procedure: CERVICAL POLYPECTOMY;  Surgeon: Florian Buff, MD;  Location: [facility];  Service: Gynecology;  Laterality: N/A;  . COLONOSCOPY  09/19/2012   Procedure: COLONOSCOPY;  Surgeon: Danie Binder, MD;  Location: [facility];  Service: Endoscopy;  Laterality: N/A;  8:30 AM  .  DILITATION & CURRETTAGE/HYSTROSCOPY WITH THERMACHOICE ABLATION N/A 01/06/2014   Procedure: DILATATION & CURETTAGE/HYSTEROSCOPY WITH THERMACHOICE ABLATION;  Surgeon: Florian Buff, MD;  Location: [facility];  Service: Gynecology;  Laterality: N/A;  total therapy time:  9 minutes 12 seconds ,     temperature:87 degrees  . NO PAST SURGERIES      Family History  Problem Relation Age of Onset  . Diabetes Father   . Heart disease Father   . Hypertension Father   . Hyperlipidemia Father   . Diabetes Maternal Uncle   . Anemia Maternal Uncle   . Arthritis Maternal Uncle   . Anxiety disorder Maternal Uncle   . Arthritis Maternal Grandmother   . Anxiety disorder Maternal Grandmother   . Heart disease Maternal Grandfather        pacemaker  . Anxiety disorder Daughter   . Colon cancer Neg Hx   . Cancer Neg Hx   . Allergic rhinitis Neg Hx   . Angioedema Neg Hx   . Asthma Neg Hx   . Eczema Neg Hx   . Immunodeficiency Neg Hx   . Urticaria Neg Hx   . Atopy Neg Hx     Social History   Socioeconomic History  . Marital status: Single    Spouse name: Not on file  . Number of children: Not on file  .  Years of education: Not on file  . Highest education level: Not on file  Occupational History  . Not on file  Tobacco Use  . Smoking status: Former Smoker    Packs/day: 0.25    Years: 3.00    Pack years: 0.75    Types: Cigarettes    Quit date: 09/03/1981    Years since quitting: 39.1  . Smokeless tobacco: Never Used  Substance and Sexual Activity  . Alcohol use: No  . Drug use: No  . Sexual activity: Never    Birth control/protection: Post-menopausal  Other Topics Concern  . Not on file  Social History Narrative  . Not on file   Social Determinants of Health   Financial Resource Strain: Not on file  Food Insecurity: Not on file  Transportation Needs: Not on file  Physical Activity: Not on file  Stress: Not on file  Social Connections: Not on file  Intimate Partner Violence: Not on  file    Outpatient Medications Prior to Visit  Medication Sig Dispense Refill  . buPROPion (WELLBUTRIN XL) 300 MG 24 hr tablet Take 1 tablet (300 mg total) by mouth every morning. 90 tablet 0  . cetirizine (ZYRTEC) 10 MG tablet Take 1 tablet (10 mg total) by mouth 2 (two) times daily. 60 tablet 5  . Cholecalciferol (VITAMIN D3) 125 MCG (5000 UT) CAPS Take by mouth.    . conjugated estrogens (PREMARIN) vaginal cream Use a small amount of cream 3-4x per week    . cyclobenzaprine (FLEXERIL) 5 MG tablet Take one tablet at bedtime as needed for back pain and spasm 15 tablet 0  . ESTRING 2 MG vaginal ring SMARTSIG:Ring Vaginal    . estrogen, conjugated,-medroxyprogesterone (PREMPRO) 0.625-2.5 MG tablet Take by mouth.    . famotidine (HM FAMOTIDINE) 20 MG tablet Take 1 tablet (20 mg total) by mouth 2 (two) times daily. 60 tablet 3  . fluticasone (FLONASE) 50 MCG/ACT nasal spray Place 2 sprays into both nostrils daily. 16 g 5  . hydrOXYzine (VISTARIL) 25 MG capsule Take 1 capsule (25 mg total) by mouth 3 (three) times daily as needed. For severe anxiety attacks only 90 capsule 1  . medroxyPROGESTERone (PROVERA) 5 MG tablet Take 5 mg by mouth daily.    . montelukast (SINGULAIR) 10 MG tablet Take 1 tablet (10 mg total) by mouth at bedtime. 30 tablet 3  . Olopatadine HCl (PAZEO) 0.7 % SOLN Place 1 drop into both eyes 1 day or 1 dose. 2.5 mL 5  . OVER THE COUNTER MEDICATION Take 1 capsule by mouth daily. Natural vitamins & minerals formula for healthier hair    . PARoxetine (PAXIL) 20 MG tablet TAKE 1 AND 1/2 TABLET BY MOUTH DAILY 135 tablet 0  . PREMARIN vaginal cream USE A SMALL AMOUNT OFACREAM 3-4 TIMES PER WEEK.    . QUEtiapine (SEROQUEL XR) 50 MG TB24 24 hr tablet Take 2 tablets (100 mg total) by mouth at bedtime. 180 tablet 0  . triamcinolone cream (KENALOG) 0.5 % Apply 1 application topically 2 (two) times daily. 15 g 1  . Vitamin D, Ergocalciferol, (DRISDOL) 1.25 MG (50000 UNIT) CAPS capsule Take  1 capsule (50,000 Units total) by mouth once a week. 12 capsule 1  . PREMPRO 0.625-2.5 MG tablet Take 1 tablet by mouth daily. (Patient not taking: Reported on 10/28/2020)     Facility-Administered Medications Prior to Visit  Medication Dose Route Frequency Provider Last Rate Last Admin  . omalizumab Geoffry Paradise) injection 300 mg  300 mg Subcutaneous Q28 days Valentina Shaggy, MD   300 mg at 12/31/17 1035    Allergies  Allergen Reactions  . Benadryl  [Diphenhydramine Hcl (Sleep)]   . Benadryl [Diphenhydramine Hcl] Other (See Comments)    Makes congestion worst   . Dextromethorphan Hbr Hives  . Robitussin (Alcohol Free) [Guaifenesin]     Makes cough worse  . Aspirin Rash and Hives    rash  . Diphenhydramine Hcl Hives and Rash    Makes congestion worst   . Latex Rash    Review of Systems  Constitutional: Negative.   Respiratory: Negative.   Cardiovascular: Negative.   Skin: Positive for rash.       Objective:    Physical Exam Constitutional:      Appearance: She is obese.  Cardiovascular:     Rate and Rhythm: Regular rhythm. Tachycardia present.     Pulses: Normal pulses.     Heart sounds: Normal heart sounds.  Pulmonary:     Effort: Pulmonary effort is normal.     Breath sounds: Normal breath sounds.  Skin:    Comments: Generalized Urticaria with signs of itching apparent  Neurological:     Mental Status: She is alert.     BP 138/84   Pulse (!) 122   Temp 99.1 F (37.3 C)   Resp 18   Ht 5\' 5"  (1.651 m)   Wt 268 lb (121.6 kg)   LMP 06/27/2013   SpO2 98%   BMI 44.60 kg/m  Wt Readings from Last 3 Encounters:  10/28/20 268 lb (121.6 kg)  10/21/20 263 lb (119.3 kg)  02/26/20 234 lb (106.1 kg)    Health Maintenance Due  Topic Date Due  . COVID-19 Vaccine (2 - Booster for Janssen series) 02/22/2020  . INFLUENZA VACCINE  05/08/2020    There are no preventive care reminders to display for this patient.   Lab Results  Component Value Date   TSH 1.83  03/18/2020   Lab Results  Component Value Date   WBC 6.6 03/18/2020   HGB 12.7 03/18/2020   HCT 38.3 03/18/2020   MCV 87.6 03/18/2020   PLT 253 03/18/2020   Lab Results  Component Value Date   NA 137 03/18/2020   K 4.7 03/18/2020   CO2 23 03/18/2020   GLUCOSE 89 03/18/2020   BUN 13 03/18/2020   CREATININE 0.94 03/18/2020   BILITOT 0.7 03/18/2020   ALKPHOS 101 06/05/2017   AST 18 03/18/2020   ALT 19 03/18/2020   PROT 7.3 03/18/2020   ALBUMIN 4.3 06/05/2017   CALCIUM 9.6 03/18/2020   ANIONGAP 10 08/27/2019   Lab Results  Component Value Date   CHOL 221 (H) 03/18/2020   Lab Results  Component Value Date   HDL 59 03/18/2020   Lab Results  Component Value Date   LDLCALC 134 (H) 03/18/2020   Lab Results  Component Value Date   TRIG 149 03/18/2020   Lab Results  Component Value Date   CHOLHDL 3.7 03/18/2020   Lab Results  Component Value Date   HGBA1C 4.9 08/18/2013       Assessment & Plan:   Problem List Items Addressed This Visit      Other   Allergy    -IM depomedrol today 40 mg, bc she has tachycardia -Rx. PO prednisone dose pack (start at 40 instead of 60 d/t tachycardia) -denies changing anything but her shampoo, but rash is to her chest, back, legs, arms, etc; not just her  hair -referred to dermatology, she has allergist referral already -will get food allergy panel as well as alpha-gal panel      Tachycardia    -HR 122 today -she states she is asymptomatic -will treat her rash today and f/u in  2 weeks to see if tachycardia resolves along with rash or if it is a separate issue          No orders of the defined types were placed in this encounter.    Heather Roberts, NP

## 2020-10-31 ENCOUNTER — Ambulatory Visit: Payer: Medicaid Other | Admitting: Nurse Practitioner

## 2020-11-03 ENCOUNTER — Ambulatory Visit: Payer: Medicaid Other | Admitting: Internal Medicine

## 2020-11-03 LAB — ALPHA-GAL PANEL
Allergen Lamb IgE: <0.1 kU/L
Beef IgE: <0.1 kU/L
IgE (Immunoglobulin E), Serum: 11 IU/mL (ref 6–495)
O215-IgE Alpha-Gal: <0.1 kU/L
Pork IgE: <0.1 kU/L

## 2020-11-03 LAB — FOOD ALLERGY PROFILE
Allergen Corn, IgE: <0.1 kU/L
Clam IgE: <0.1 kU/L
Codfish IgE: <0.1 kU/L
Egg White IgE: <0.1 kU/L
Milk IgE: <0.1 kU/L
Peanut IgE: <0.1 kU/L
Scallop IgE: <0.1 kU/L
Sesame Seed IgE: <0.1 kU/L
Shrimp IgE: <0.1 kU/L
Soybean IgE: <0.1 kU/L
Walnut IgE: <0.1 kU/L
Wheat IgE: <0.1 kU/L

## 2020-11-03 NOTE — Progress Notes (Signed)
The food allergy panel and alpha-gal panel were negative. So, it is unlikely that a food item was the source of your rash.

## 2020-11-11 ENCOUNTER — Ambulatory Visit: Payer: Medicaid Other | Admitting: Nurse Practitioner

## 2020-11-14 ENCOUNTER — Ambulatory Visit: Payer: Medicaid Other | Admitting: Nurse Practitioner

## 2020-11-18 ENCOUNTER — Ambulatory Visit (INDEPENDENT_AMBULATORY_CARE_PROVIDER_SITE_OTHER): Payer: Medicaid Other | Admitting: Nurse Practitioner

## 2020-11-18 ENCOUNTER — Encounter: Payer: Self-pay | Admitting: Nurse Practitioner

## 2020-11-18 ENCOUNTER — Other Ambulatory Visit: Payer: Self-pay

## 2020-11-18 DIAGNOSIS — I10 Essential (primary) hypertension: Secondary | ICD-10-CM

## 2020-11-18 DIAGNOSIS — R000 Tachycardia, unspecified: Secondary | ICD-10-CM | POA: Diagnosis not present

## 2020-11-18 MED ORDER — BLOOD PRESSURE MONITOR AUTOMAT DEVI: 1.0000 | Freq: Every day | 0 refills | Status: DC

## 2020-11-18 MED ORDER — PROPRANOLOL HCL 40 MG PO TABS: 40.0000 mg | ORAL_TABLET | Freq: Two times a day (BID) | ORAL | 0 refills | Status: DC

## 2020-11-18 NOTE — Progress Notes (Signed)
Acute Office Visit  Subjective:    Patient ID: Karen Cox, female    DOB: Aug 14, 1961, 60 y.o.   MRN: [id number]  Chief Complaint  Patient presents with  . Rash    Follow up, rash about the same on both arms. Has not seen dermatologist yet.    HPI Patient is in today for rash and tachycardia.  At last OV on 10/28/20, she had a rash to her chest, back, legs, and arms. We checked a food allergy panel, but that was negative. She got 40 mg of depomedrol.  She has upcoming appointment with Dr. Stanton Kidney on 01/06/21 with dermatology with [facility] in [place].  Additionally, she had aymptomatic tachycardia at that time.  Her HR is still elevated today.  She denies symptoms today, but she states that she did have some dizziness once in the last week.  Past Medical History:  Diagnosis Date  . Allergy   . Anxiety   . Cervical high risk HPV (human papillomavirus) test positive 05/17/2014  . Depression   . Eczema   . GERD (gastroesophageal reflux disease)   . Hot flashes 09/01/2013  . Hyperlipidemia   . Mild mental slowing   . Peri-menopause 08/05/2013  . PMB (postmenopausal bleeding) 11/16/2013  . Urticaria   . Vaginal itching 08/05/2013  . Yeast infection 08/05/2013    Past Surgical History:  Procedure Laterality Date  . CERVICAL POLYPECTOMY N/A 01/06/2014   Procedure: CERVICAL POLYPECTOMY;  Surgeon: Florian Buff, MD;  Location: [facility];  Service: Gynecology;  Laterality: N/A;  . COLONOSCOPY  09/19/2012   Procedure: COLONOSCOPY;  Surgeon: Danie Binder, MD;  [facility];  Service: Endoscopy;  Laterality: N/A;  8:30 AM  . DILITATION & CURRETTAGE/HYSTROSCOPY WITH THERMACHOICE ABLATION N/A 01/06/2014   Procedure: DILATATION & CURETTAGE/HYSTEROSCOPY WITH THERMACHOICE ABLATION;  Surgeon: Florian Buff, MD;  Location: [facility];  Service: Gynecology;  Laterality: N/A;  total therapy time:  9 minutes 12 seconds ,     temperature:87 degrees  . NO PAST SURGERIES      Family History   Problem Relation Age of Onset  . Diabetes Father   . Heart disease Father   . Hypertension Father   . Hyperlipidemia Father   . Diabetes Maternal Uncle   . Anemia Maternal Uncle   . Arthritis Maternal Uncle   . Anxiety disorder Maternal Uncle   . Arthritis Maternal Grandmother   . Anxiety disorder Maternal Grandmother   . Heart disease Maternal Grandfather        pacemaker  . Anxiety disorder Daughter   . Colon cancer Neg Hx   . Cancer Neg Hx   . Allergic rhinitis Neg Hx   . Angioedema Neg Hx   . Asthma Neg Hx   . Eczema Neg Hx   . Immunodeficiency Neg Hx   . Urticaria Neg Hx   . Atopy Neg Hx     Social History   Socioeconomic History  . Marital status: Single    Spouse name: Not on file  . Number of children: Not on file  . Years of education: Not on file  . Highest education level: Not on file  Occupational History  . Not on file  Tobacco Use  . Smoking status: Former Smoker    Packs/day: 0.25    Years: 3.00    Pack years: 0.75    Types: Cigarettes    Quit date: 09/03/1981    Years since quitting: 39.2  . Smokeless  tobacco: Never Used  Substance and Sexual Activity  . Alcohol use: No  . Drug use: No  . Sexual activity: Never    Birth control/protection: Post-menopausal  Other Topics Concern  . Not on file  Social History Narrative  . Not on file   Social Determinants of Health   Financial Resource Strain: Not on file  Food Insecurity: Not on file  Transportation Needs: Not on file  Physical Activity: Not on file  Stress: Not on file  Social Connections: Not on file  Intimate Partner Violence: Not on file    Outpatient Medications Prior to Visit  Medication Sig Dispense Refill  . buPROPion (WELLBUTRIN XL) 300 MG 24 hr tablet Take 1 tablet (300 mg total) by mouth every morning. 90 tablet 0  . cetirizine (ZYRTEC) 10 MG tablet Take 1 tablet (10 mg total) by mouth 2 (two) times daily. 60 tablet 5  . Cholecalciferol (VITAMIN D3) 125 MCG (5000 UT)  CAPS Take by mouth.    . cyclobenzaprine (FLEXERIL) 5 MG tablet Take one tablet at bedtime as needed for back pain and spasm 15 tablet 0  . ESTRING 2 MG vaginal ring SMARTSIG:Ring Vaginal    . famotidine (HM FAMOTIDINE) 20 MG tablet Take 1 tablet (20 mg total) by mouth 2 (two) times daily. 60 tablet 3  . fluticasone (FLONASE) 50 MCG/ACT nasal spray Place 2 sprays into both nostrils daily. 16 g 5  . hydrOXYzine (VISTARIL) 25 MG capsule Take 1 capsule (25 mg total) by mouth 3 (three) times daily as needed. For severe anxiety attacks only 90 capsule 1  . medroxyPROGESTERone (PROVERA) 5 MG tablet Take 5 mg by mouth daily.    . montelukast (SINGULAIR) 10 MG tablet Take 1 tablet (10 mg total) by mouth at bedtime. 30 tablet 3  . Olopatadine HCl (PAZEO) 0.7 % SOLN Place 1 drop into both eyes 1 day or 1 dose. 2.5 mL 5  . OVER THE COUNTER MEDICATION Take 1 capsule by mouth daily. Natural vitamins & minerals formula for healthier hair    . PARoxetine (PAXIL) 20 MG tablet TAKE 1 AND 1/2 TABLET BY MOUTH DAILY 135 tablet 0  . PREMARIN vaginal cream USE A SMALL AMOUNT OFACREAM 3-4 TIMES PER WEEK.    Marland Kitchen PREMPRO 0.625-2.5 MG tablet Take 1 tablet by mouth daily.    . QUEtiapine (SEROQUEL XR) 50 MG TB24 24 hr tablet Take 2 tablets (100 mg total) by mouth at bedtime. 180 tablet 0  . triamcinolone cream (KENALOG) 0.5 % Apply 1 application topically 2 (two) times daily. 15 g 1  . Vitamin D, Ergocalciferol, (DRISDOL) 1.25 MG (50000 UNIT) CAPS capsule Take 1 capsule (50,000 Units total) by mouth once a week. 12 capsule 1  . conjugated estrogens (PREMARIN) vaginal cream Use a small amount of cream 3-4x per week    . estrogen, conjugated,-medroxyprogesterone (PREMPRO) 0.625-2.5 MG tablet Take by mouth.     Facility-Administered Medications Prior to Visit  Medication Dose Route Frequency Provider Last Rate Last Admin  . omalizumab Arvid Right) injection 300 mg  300 mg Subcutaneous Q28 days Valentina Shaggy, MD   300 mg at  12/31/17 1035    Allergies  Allergen Reactions  . Benadryl  [Diphenhydramine Hcl (Sleep)]   . Benadryl [Diphenhydramine Hcl] Other (See Comments)    Makes congestion worst   . Dextromethorphan Hbr Hives  . Robitussin (Alcohol Free) [Guaifenesin]     Makes cough worse  . Aspirin Rash and Hives    rash  .  Diphenhydramine Hcl Hives and Rash    Makes congestion worst   . Latex Rash    Review of Systems  Constitutional: Negative.   Respiratory: Negative.   Cardiovascular: Negative.   Skin:       She states her rash comes and goes  Psychiatric/Behavioral: Negative.        Objective:    Physical Exam Constitutional:      Appearance: She is obese.     Comments: Sweat on her brow, but she has good coloration  Cardiovascular:     Rate and Rhythm: Regular rhythm. Tachycardia present.     Pulses: Normal pulses.     Heart sounds: Normal heart sounds.  Pulmonary:     Effort: Pulmonary effort is normal.  Neurological:     Mental Status: She is alert.  Psychiatric:        Mood and Affect: Mood normal.        Behavior: Behavior normal.        Thought Content: Thought content normal.        Judgment: Judgment normal.     BP (!) 152/96 (BP Location: Right Arm, Patient Position: Sitting, Cuff Size: Normal)   Temp 99.4 F (37.4 C) (Oral)   Ht 5\' 5"  (1.651 m)   Wt 270 lb (122.5 kg)   LMP 06/27/2013   SpO2 97%   BMI 44.93 kg/m  Wt Readings from Last 3 Encounters:  11/18/20 270 lb (122.5 kg)  10/28/20 268 lb (121.6 kg)  10/21/20 263 lb (119.3 kg)    Health Maintenance Due  Topic Date Due  . COVID-19 Vaccine (2 - Booster for Janssen series) 02/22/2020    There are no preventive care reminders to display for this patient.   Lab Results  Component Value Date   TSH 1.83 03/18/2020   Lab Results  Component Value Date   WBC 6.6 03/18/2020   HGB 12.7 03/18/2020   HCT 38.3 03/18/2020   MCV 87.6 03/18/2020   PLT 253 03/18/2020   Lab Results  Component Value Date    NA 137 03/18/2020   K 4.7 03/18/2020   CO2 23 03/18/2020   GLUCOSE 89 03/18/2020   BUN 13 03/18/2020   CREATININE 0.94 03/18/2020   BILITOT 0.7 03/18/2020   ALKPHOS 101 06/05/2017   AST 18 03/18/2020   ALT 19 03/18/2020   PROT 7.3 03/18/2020   ALBUMIN 4.3 06/05/2017   CALCIUM 9.6 03/18/2020   ANIONGAP 10 08/27/2019   Lab Results  Component Value Date   CHOL 221 (H) 03/18/2020   Lab Results  Component Value Date   HDL 59 03/18/2020   Lab Results  Component Value Date   LDLCALC 134 (H) 03/18/2020   Lab Results  Component Value Date   TRIG 149 03/18/2020   Lab Results  Component Value Date   CHOLHDL 3.7 03/18/2020   Lab Results  Component Value Date   HGBA1C 4.9 08/18/2013       Assessment & Plan:   Problem List Items Addressed This Visit      Cardiovascular and Mediastinum   Essential hypertension    -BP 152/96 -Rx. Propranolol -Rx. Home BP cuff      Relevant Medications   propranolol (INDERAL) 40 MG tablet     Other   Tachycardia    -HR 127 today -EKG performed shows rate 123 with sinus tachycardia -Records reviewed: she had previous Echo for tachycardia with Dr. Harl Bowie on 09/30/2019 and she at that time she had normal  echo with LVEF 60-65% -asymptomatic today -since she had extensive workup for similar issue, will Rx. Propanolol and see her back in 2 weeks -if she becomes symptomatic, she knows to go to ED for complete cardiac workup           Meds ordered this encounter  Medications  . Blood Pressure Monitoring (BLOOD PRESSURE MONITOR AUTOMAT) DEVI    Sig: 1 each by Does not apply route daily.    Dispense:  1 each    Refill:  0  . propranolol (INDERAL) 40 MG tablet    Sig: Take 1 tablet (40 mg total) by mouth 2 (two) times daily.    Dispense:  60 tablet    Refill:  0   Time spent: 44 minutes  Noreene Larsson, NP

## 2020-11-18 NOTE — Patient Instructions (Signed)
If you become symptomatic (short of breath, pale and sweaty, feeling like you are going to pass out), please go to the emergency department for a full cardiac workup.

## 2020-11-18 NOTE — Assessment & Plan Note (Signed)
-  BP 152/96 -Rx. Propranolol -Rx. Home BP cuff

## 2020-11-18 NOTE — Assessment & Plan Note (Addendum)
-  HR 127 today -EKG performed shows rate 123 with sinus tachycardia -Records reviewed: she had previous Echo for tachycardia with Dr. Harl Bowie on 09/30/2019 and she at that time she had normal echo with LVEF 60-65% -asymptomatic today -since she had extensive workup for similar issue, will Rx. Propanolol and see her back in 2 weeks -if she becomes symptomatic, she knows to go to ED for complete cardiac workup

## 2020-11-21 ENCOUNTER — Other Ambulatory Visit: Payer: Self-pay | Admitting: Allergy & Immunology

## 2020-12-02 ENCOUNTER — Other Ambulatory Visit: Payer: Self-pay

## 2020-12-02 ENCOUNTER — Encounter: Payer: Self-pay | Admitting: Nurse Practitioner

## 2020-12-02 ENCOUNTER — Ambulatory Visit: Payer: Medicaid Other | Admitting: Nurse Practitioner

## 2020-12-02 VITALS — BP 134/94 | HR 82 | Temp 99.6°F | Resp 20 | Ht 65.0 in | Wt 270.0 lb

## 2020-12-02 DIAGNOSIS — I10 Essential (primary) hypertension: Secondary | ICD-10-CM

## 2020-12-02 DIAGNOSIS — R000 Tachycardia, unspecified: Secondary | ICD-10-CM

## 2020-12-02 DIAGNOSIS — Z0000 Encounter for general adult medical examination without abnormal findings: Secondary | ICD-10-CM

## 2020-12-02 MED ORDER — PROPRANOLOL HCL ER 60 MG PO CP24: 60.0000 mg | ORAL_CAPSULE | Freq: Every day | ORAL | 1 refills | Status: DC

## 2020-12-02 NOTE — Assessment & Plan Note (Signed)
-  improved since starting propranolol; HR today is 82 -will increase dose to help with HTN

## 2020-12-02 NOTE — Progress Notes (Signed)
Acute Office Visit  Subjective:    Patient ID: Karen Cox, female    DOB: 05/09/1961, 60 y.o.   MRN: [id number]  Chief Complaint  Patient presents with  . Hypertension    Follow up     HPI Patient is in today for BP check.  At her last OV on 11/18/20, her BP was 152/96 and HR was 127 and she was started on propranolol.  She states her home BP has been in the 120s-130s/80s-90s. Denies adverse medication effects.    Past Medical History:  Diagnosis Date  . Allergy   . Anxiety   . Cervical high risk HPV (human papillomavirus) test positive 05/17/2014  . Depression   . Eczema   . GERD (gastroesophageal reflux disease)   . Hot flashes 09/01/2013  . Hyperlipidemia   . Mild mental slowing   . Peri-menopause 08/05/2013  . PMB (postmenopausal bleeding) 11/16/2013  . Urticaria   . Vaginal itching 08/05/2013  . Yeast infection 08/05/2013    Past Surgical History:  Procedure Laterality Date  . CERVICAL POLYPECTOMY N/A 01/06/2014   Procedure: CERVICAL POLYPECTOMY;  Surgeon: Florian Buff, MD;  Location: [facility];  Service: Gynecology;  Laterality: N/A;  . COLONOSCOPY  09/19/2012   Procedure: COLONOSCOPY;  Surgeon: Danie Binder, MD;  Location: [facility];  Service: Endoscopy;  Laterality: N/A;  8:30 AM  . DILITATION & CURRETTAGE/HYSTROSCOPY WITH THERMACHOICE ABLATION N/A 01/06/2014   Procedure: DILATATION & CURETTAGE/HYSTEROSCOPY WITH THERMACHOICE ABLATION;  Surgeon: Florian Buff, MD;  Location: [facility];  Service: Gynecology;  Laterality: N/A;  total therapy time:  9 minutes 12 seconds ,     temperature:87 degrees  . NO PAST SURGERIES      Family History  Problem Relation Age of Onset  . Diabetes Father   . Heart disease Father   . Hypertension Father   . Hyperlipidemia Father   . Diabetes Maternal Uncle   . Anemia Maternal Uncle   . Arthritis Maternal Uncle   . Anxiety disorder Maternal Uncle   . Arthritis Maternal Grandmother   . Anxiety disorder Maternal  Grandmother   . Heart disease Maternal Grandfather        pacemaker  . Anxiety disorder Daughter   . Colon cancer Neg Hx   . Cancer Neg Hx   . Allergic rhinitis Neg Hx   . Angioedema Neg Hx   . Asthma Neg Hx   . Eczema Neg Hx   . Immunodeficiency Neg Hx   . Urticaria Neg Hx   . Atopy Neg Hx     Social History   Socioeconomic History  . Marital status: Single    Spouse name: Not on file  . Number of children: Not on file  . Years of education: Not on file  . Highest education level: Not on file  Occupational History  . Not on file  Tobacco Use  . Smoking status: Former Smoker    Packs/day: 0.25    Years: 3.00    Pack years: 0.75    Types: Cigarettes    Quit date: 09/03/1981    Years since quitting: 39.2  . Smokeless tobacco: Never Used  Substance and Sexual Activity  . Alcohol use: No  . Drug use: No  . Sexual activity: Never    Birth control/protection: Post-menopausal  Other Topics Concern  . Not on file  Social History Narrative  . Not on file   Social Determinants of Health   Financial Resource Strain:  Not on file  Food Insecurity: Not on file  Transportation Needs: Not on file  Physical Activity: Not on file  Stress: Not on file  Social Connections: Not on file  Intimate Partner Violence: Not on file    Outpatient Medications Prior to Visit  Medication Sig Dispense Refill  . Blood Pressure Monitoring (BLOOD PRESSURE MONITOR AUTOMAT) DEVI 1 each by Does not apply route daily. 1 each 0  . buPROPion (WELLBUTRIN XL) 300 MG 24 hr tablet Take 1 tablet (300 mg total) by mouth every morning. 90 tablet 0  . cetirizine (ZYRTEC) 10 MG tablet TAKE (1) TABLET BY MOUTH TWICE DAILY. 60 tablet 0  . Cholecalciferol (VITAMIN D3) 125 MCG (5000 UT) CAPS Take by mouth.    . cyclobenzaprine (FLEXERIL) 5 MG tablet Take one tablet at bedtime as needed for back pain and spasm 15 tablet 0  . ESTRING 2 MG vaginal ring SMARTSIG:Ring Vaginal    . famotidine (HM FAMOTIDINE) 20 MG  tablet Take 1 tablet (20 mg total) by mouth 2 (two) times daily. 60 tablet 3  . fluticasone (FLONASE) 50 MCG/ACT nasal spray Place 2 sprays into both nostrils daily. 16 g 5  . hydrOXYzine (VISTARIL) 25 MG capsule Take 1 capsule (25 mg total) by mouth 3 (three) times daily as needed. For severe anxiety attacks only 90 capsule 1  . medroxyPROGESTERone (PROVERA) 5 MG tablet Take 5 mg by mouth daily.    . montelukast (SINGULAIR) 10 MG tablet Take 1 tablet (10 mg total) by mouth at bedtime. 30 tablet 3  . Olopatadine HCl (PAZEO) 0.7 % SOLN Place 1 drop into both eyes 1 day or 1 dose. 2.5 mL 5  . OVER THE COUNTER MEDICATION Take 1 capsule by mouth daily. Natural vitamins & minerals formula for healthier hair    . PARoxetine (PAXIL) 20 MG tablet TAKE 1 AND 1/2 TABLET BY MOUTH DAILY 135 tablet 0  . PREMARIN vaginal cream USE A SMALL AMOUNT OFACREAM 3-4 TIMES PER WEEK.    Marland Kitchen PREMPRO 0.625-2.5 MG tablet Take 1 tablet by mouth daily.    . QUEtiapine (SEROQUEL XR) 50 MG TB24 24 hr tablet Take 2 tablets (100 mg total) by mouth at bedtime. 180 tablet 0  . triamcinolone cream (KENALOG) 0.5 % Apply 1 application topically 2 (two) times daily. 15 g 1  . Vitamin D, Ergocalciferol, (DRISDOL) 1.25 MG (50000 UNIT) CAPS capsule Take 1 capsule (50,000 Units total) by mouth once a week. 12 capsule 1  . propranolol (INDERAL) 40 MG tablet Take 1 tablet (40 mg total) by mouth 2 (two) times daily. 60 tablet 0   Facility-Administered Medications Prior to Visit  Medication Dose Route Frequency Provider Last Rate Last Admin  . omalizumab Arvid Right) injection 300 mg  300 mg Subcutaneous Q28 days Valentina Shaggy, MD   300 mg at 12/31/17 1035    Allergies  Allergen Reactions  . Benadryl  [Diphenhydramine Hcl (Sleep)]   . Benadryl [Diphenhydramine Hcl] Other (See Comments)    Makes congestion worst   . Dextromethorphan Hbr Hives  . Robitussin (Alcohol Free) [Guaifenesin]     Makes cough worse  . Aspirin Rash and Hives     rash  . Diphenhydramine Hcl Hives and Rash    Makes congestion worst   . Latex Rash    Review of Systems  Constitutional: Negative.   Respiratory: Negative.   Cardiovascular: Negative.   Psychiatric/Behavioral: Negative.        Objective:    Physical  Exam Constitutional:      Appearance: She is obese.  Cardiovascular:     Rate and Rhythm: Normal rate and regular rhythm.     Pulses: Normal pulses.     Heart sounds: Normal heart sounds.  Pulmonary:     Effort: Pulmonary effort is normal.     Breath sounds: Normal breath sounds.  Neurological:     Mental Status: She is alert.  Psychiatric:        Mood and Affect: Mood normal.        Behavior: Behavior normal.        Thought Content: Thought content normal.        Judgment: Judgment normal.     BP (!) 134/94   Pulse 82   Temp 99.6 F (37.6 C)   Resp 20   Ht _0  (1.651 m)   Wt 270 lb (122.5 kg)   LMP 06/27/2013   SpO2 97%   BMI 44.93 kg/m  Wt Readings from Last 3 Encounters:  12/02/20 270 lb (122.5 kg)  11/18/20 270 lb (122.5 kg)  10/28/20 268 lb (121.6 kg)    Health Maintenance Due  Topic Date Due  . COVID-19 Vaccine (2 - Booster for Janssen series) 02/22/2020    There are no preventive care reminders to display for this patient.   Lab Results  Component Value Date   TSH 1.83 03/18/2020   Lab Results  Component Value Date   WBC 6.6 03/18/2020   HGB 12.7 03/18/2020   HCT 38.3 03/18/2020   MCV 87.6 03/18/2020   PLT 253 03/18/2020   Lab Results  Component Value Date   NA 137 03/18/2020   K 4.7 03/18/2020   CO2 23 03/18/2020   GLUCOSE 89 03/18/2020   BUN 13 03/18/2020   CREATININE 0.94 03/18/2020   BILITOT 0.7 03/18/2020   ALKPHOS 101 06/05/2017   AST 18 03/18/2020   ALT 19 03/18/2020   PROT 7.3 03/18/2020   ALBUMIN 4.3 06/05/2017   CALCIUM 9.6 03/18/2020   ANIONGAP 10 08/27/2019   Lab Results  Component Value Date   CHOL 221 (H) 03/18/2020   Lab Results  Component Value  Date   HDL 59 03/18/2020   Lab Results  Component Value Date   LDLCALC 134 (H) 03/18/2020   Lab Results  Component Value Date   TRIG 149 03/18/2020   Lab Results  Component Value Date   CHOLHDL 3.7 03/18/2020   Lab Results  Component Value Date   HGBA1C 4.9 08/18/2013       Assessment & Plan:   Problem List Items Addressed This Visit      Cardiovascular and Mediastinum   Essential hypertension    BP Readings from Last 3 Encounters:  12/02/20 (!) 134/94  11/18/20 (!) 152/96  10/28/20 138/84  -DBP still elevated -INCREASE propranolol to 60 mg BID      Relevant Medications   propranolol ER (INDERAL LA) 60 MG 24 hr capsule     Other   Tachycardia    -improved since starting propranolol; HR today is 82 -will increase dose to help with HTN       Other Visit Diagnoses    Preventative health care    -  Primary   Relevant Orders   CBC with Differential/Platelet   CMP14+EGFR   Lipid Panel With LDL/HDL Ratio       Meds ordered this encounter  Medications  . propranolol ER (INDERAL LA) 60 MG 24 hr capsule  Sig: Take 1 capsule (60 mg total) by mouth daily.    Dispense:  180 capsule    Refill:  [place], NP

## 2020-12-02 NOTE — Assessment & Plan Note (Signed)
BP Readings from Last 3 Encounters:  12/02/20 (!) 134/94  11/18/20 (!) 152/96  10/28/20 138/84  -DBP still elevated -INCREASE propranolol to 60 mg BID

## 2020-12-12 ENCOUNTER — Other Ambulatory Visit: Payer: Self-pay | Admitting: Psychiatry

## 2020-12-12 DIAGNOSIS — F251 Schizoaffective disorder, depressive type: Secondary | ICD-10-CM

## 2020-12-20 ENCOUNTER — Ambulatory Visit: Payer: Medicaid Other | Admitting: Psychiatry

## 2020-12-26 ENCOUNTER — Ambulatory Visit (INDEPENDENT_AMBULATORY_CARE_PROVIDER_SITE_OTHER): Payer: Medicaid Other | Admitting: Gastroenterology

## 2020-12-26 ENCOUNTER — Other Ambulatory Visit: Payer: Self-pay

## 2020-12-26 ENCOUNTER — Encounter: Payer: Self-pay | Admitting: Gastroenterology

## 2020-12-26 VITALS — BP 134/86 | HR 83 | Temp 96.6°F | Ht 65.0 in | Wt 275.6 lb

## 2020-12-26 DIAGNOSIS — R1310 Dysphagia, unspecified: Secondary | ICD-10-CM | POA: Diagnosis not present

## 2020-12-26 DIAGNOSIS — K219 Gastro-esophageal reflux disease without esophagitis: Secondary | ICD-10-CM | POA: Diagnosis not present

## 2020-12-26 MED ORDER — PANTOPRAZOLE SODIUM 40 MG PO TBEC: 40.0000 mg | DELAYED_RELEASE_TABLET | Freq: Two times a day (BID) | ORAL | 5 refills | Status: DC

## 2020-12-26 NOTE — Patient Instructions (Addendum)
1. Start pantoprazole 40 mg 30 minutes before breakfast and 30 minutes before evening meal. 2. For reflux: Do not overeat, avoid fried/greasy foods, avoid foods with lots of acid (orange juice, tomato-based foods, citrus fruits).  Make sure he wait at least 2 hours between eating and laying down.  You would also benefit from weight reduction as being overweight does increase your reflux symptoms.  Reduce carbonated beverages which will increase reflux symptoms. 3. Return in 6 weeks to see Dr. Abbey Chatters.  If you are not feeling a lot better, you will need to have an upper endoscopy to evaluate your symptoms. 4. Call sooner if you have any questions or concerns.   Food Choices for Gastroesophageal Reflux Disease, Adult When you have gastroesophageal reflux disease (GERD), the foods you eat and your eating habits are very important. Choosing the right foods can help ease the discomfort of GERD. Consider working with a dietitian to help you make healthy food choices. What are tips for following this plan? Reading food labels  Look for foods that are low in saturated fat. Foods that have less than 5% of daily value (DV) of fat and 0 g of trans fats may help with your symptoms. Cooking  Cook foods using methods other than frying. This may include baking, steaming, grilling, or broiling. These are all methods that do not need a lot of fat for cooking.  To add flavor, try to use herbs that are low in spice and acidity. Meal planning  Choose healthy foods that are low in fat, such as fruits, vegetables, whole grains, low-fat dairy products, lean meats, fish, and poultry.  Eat frequent, small meals instead of three large meals each day. Eat your meals slowly, in a relaxed setting. Avoid bending over or lying down until 2-3 hours after eating.  Limit high-fat foods such as fatty meats or fried foods.  Limit your intake of fatty foods, such as oils, butter, and shortening.  Avoid the following as told by  your health care provider: ? Foods that cause symptoms. These may be different for different people. Keep a food diary to keep track of foods that cause symptoms. ? Alcohol. ? Drinking large amounts of liquid with meals. ? Eating meals during the 2-3 hours before bed.   Lifestyle  Maintain a healthy weight. Ask your health care provider what weight is healthy for you. If you need to lose weight, work with your health care provider to do so safely.  Exercise for at least 30 minutes on 5 or more days each week, or as told by your health care provider.  Avoid wearing clothes that fit tightly around your waist and chest.  Do not use any products that contain nicotine or tobacco. These products include cigarettes, chewing tobacco, and vaping devices, such as e-cigarettes. If you need help quitting, ask your health care provider.  Sleep with the head of your bed raised. Use a wedge under the mattress or blocks under the bed frame to raise the head of the bed.  Chew sugar-free gum after mealtimes. What foods should I eat? Eat a healthy, well-balanced diet of fruits, vegetables, whole grains, low-fat dairy products, lean meats, fish, and poultry. Each person is different. Foods that may trigger symptoms in one person may not trigger any symptoms in another person. Work with your health care provider to identify foods that are safe for you. The items listed above may not be a complete list of recommended foods and beverages. Contact a dietitian  for more information.   What foods should I avoid? Limiting some of these foods may help manage the symptoms of GERD. Everyone is different. Consult a dietitian or your health care provider to help you identify the exact foods to avoid, if any. Fruits Any fruits prepared with added fat. Any fruits that cause symptoms. For some people this may include citrus fruits, such as oranges, grapefruit, pineapple, and lemons. Vegetables Deep-fried vegetables. [place]  fries. Any vegetables prepared with added fat. Any vegetables that cause symptoms. For some people, this may include tomatoes and tomato products, chili peppers, onions and garlic, and horseradish. Grains Pastries or quick breads with added fat. Meats and other proteins High-fat meats, such as fatty beef or pork, hot dogs, ribs, ham, sausage, salami, and bacon. Fried meat or protein, including fried fish and fried chicken. Nuts and nut butters, in large amounts. Dairy Whole milk and chocolate milk. Sour cream. Cream. Ice cream. Cream cheese. Milkshakes. Fats and oils Butter. Margarine. Shortening. Ghee. Beverages Coffee and tea, with or without caffeine. Carbonated beverages. Sodas. Energy drinks. Fruit juice made with acidic fruits, such as orange or grapefruit. Tomato juice. Alcoholic drinks. Sweets and desserts Chocolate and cocoa. Donuts. Seasonings and condiments Pepper. Peppermint and spearmint. Added salt. Any condiments, herbs, or seasonings that cause symptoms. For some people, this may include curry, hot sauce, or vinegar-based salad dressings. The items listed above may not be a complete list of foods and beverages to avoid. Contact a dietitian for more information. Questions to ask your health care provider Diet and lifestyle changes are usually the first steps that are taken to manage symptoms of GERD. If diet and lifestyle changes do not improve your symptoms, talk with your health care provider about taking medicines. Where to find more information  International Foundation for Gastrointestinal Disorders: aboutgerd.org Summary  When you have gastroesophageal reflux disease (GERD), food and lifestyle choices may be very helpful in easing the discomfort of GERD.  Eat frequent, small meals instead of three large meals each day. Eat your meals slowly, in a relaxed setting. Avoid bending over or lying down until 2-3 hours after eating.  Limit high-fat foods such as fatty meats or  fried foods. This information is not intended to replace advice given to you by your health care provider. Make sure you discuss any questions you have with your health care provider. Document Revised: 04/04/2020 Document Reviewed: 04/04/2020 Elsevier Patient Education  [place].    [employer]'s Current Therapy 2021 (pp. 213-216). [place], [place]: Elsevier.">  Gastroesophageal Reflux Disease, Adult Gastroesophageal reflux (GER) happens when acid from the stomach flows up into the tube that connects the mouth and the stomach (esophagus). Normally, food travels down the esophagus and stays in the stomach to be digested. However, when a person has GER, food and stomach acid sometimes move back up into the esophagus. If this becomes a more serious problem, the person may be diagnosed with a disease called gastroesophageal reflux disease (GERD). GERD occurs when the reflux:  Happens often.  Causes frequent or severe symptoms.  Causes problems such as damage to the esophagus. When stomach acid comes in contact with the esophagus, the acid may cause inflammation in the esophagus. Over time, GERD may create small holes (ulcers) in the lining of the esophagus. What are the causes? This condition is caused by a problem with the muscle between the esophagus and the stomach (lower esophageal sphincter, or LES). Normally, the LES muscle closes after food passes through the  esophagus to the stomach. When the LES is weakened or abnormal, it does not close properly, and that allows food and stomach acid to go back up into the esophagus. The LES can be weakened by certain dietary substances, medicines, and medical conditions, including:  Tobacco use.  Pregnancy.  Having a hiatal hernia.  Alcohol use.  Certain foods and beverages, such as coffee, chocolate, onions, and peppermint. What increases the risk? You are more likely to develop this condition if you:  Have an increased body  weight.  Have a connective tissue disorder.  Take NSAIDs, such as ibuprofen. What are the signs or symptoms? Symptoms of this condition include:  Heartburn.  Difficult or painful swallowing and the feeling of having a lump in the throat.  A bitter taste in the mouth.  Bad breath and having a large amount of saliva.  Having an upset or bloated stomach and belching.  Chest pain. Different conditions can cause chest pain. Make sure you see your health care provider if you experience chest pain.  Shortness of breath or wheezing.  Ongoing (chronic) cough or a nighttime cough.  Wearing away of tooth enamel.  Weight loss. How is this diagnosed? This condition may be diagnosed based on a medical history and a physical exam. To determine if you have mild or severe GERD, your health care provider may also monitor how you respond to treatment. You may also have tests, including:  A test to examine your stomach and esophagus with a small camera (endoscopy).  A test that measures the acidity level in your esophagus.  A test that measures how much pressure is on your esophagus.  A barium swallow or modified barium swallow test to show the shape, size, and functioning of your esophagus. How is this treated? Treatment for this condition may vary depending on how severe your symptoms are. Your health care provider may recommend:  Changes to your diet.  Medicine.  Surgery. The goal of treatment is to help relieve your symptoms and to prevent complications. Follow these instructions at home: Eating and drinking  Follow a diet as recommended by your health care provider. This may involve avoiding foods and drinks such as: ? Coffee and tea, with or without caffeine. ? Drinks that contain alcohol. ? Energy drinks and sports drinks. ? Carbonated drinks or sodas. ? Chocolate and cocoa. ? Peppermint and mint flavorings. ? Garlic and onions. ? Horseradish. ? Spicy and acidic foods,  including peppers, chili powder, curry powder, vinegar, hot sauces, and barbecue sauce. ? Citrus fruit juices and citrus fruits, such as oranges, lemons, and limes. ? Tomato-based foods, such as red sauce, chili, salsa, and pizza with red sauce. ? Fried and fatty foods, such as donuts, french fries, potato chips, and high-fat dressings. ? High-fat meats, such as hot dogs and fatty cuts of red and white meats, such as rib eye steak, sausage, ham, and bacon. ? High-fat dairy items, such as whole milk, butter, and cream cheese.  Eat small, frequent meals instead of large meals.  Avoid drinking large amounts of liquid with your meals.  Avoid eating meals during the 2-3 hours before bedtime.  Avoid lying down right after you eat.  Do not exercise right after you eat.   Lifestyle  Do not use any products that contain nicotine or tobacco. These products include cigarettes, chewing tobacco, and vaping devices, such as e-cigarettes. If you need help quitting, ask your health care provider.  Try to reduce your stress by using  methods such as yoga or meditation. If you need help reducing stress, ask your health care provider.  If you are overweight, reduce your weight to an amount that is healthy for you. Ask your health care provider for guidance about a safe weight loss goal.   General instructions  Pay attention to any changes in your symptoms.  Take over-the-counter and prescription medicines only as told by your health care provider. Do not take aspirin, ibuprofen, or other NSAIDs unless your health care provider told you to take these medicines.  Wear loose-fitting clothing. Do not wear anything tight around your waist that causes pressure on your abdomen.  Raise (elevate) the head of your bed about 6 inches (15 cm). You can use a wedge to do this.  Avoid bending over if this makes your symptoms worse.  Keep all follow-up visits. This is important. Contact a health care provider  if:  You have: ? New symptoms. ? Unexplained weight loss. ? Difficulty swallowing or it hurts to swallow. ? Wheezing or a persistent cough. ? A hoarse voice.  Your symptoms do not improve with treatment. Get help right away if:  You have sudden pain in your arms, neck, jaw, teeth, or back.  You suddenly feel sweaty, dizzy, or light-headed.  You have chest pain or shortness of breath.  You vomit and the vomit is green, yellow, or black, or it looks like blood or coffee grounds.  You faint.  You have stool that is red, bloody, or black.  You cannot swallow, drink, or eat. These symptoms may represent a serious problem that is an emergency. Do not wait to see if the symptoms will go away. Get medical help right away. Call your local emergency services (911 in the [place] Do not drive yourself to the hospital. Summary  Gastroesophageal reflux happens when acid from the stomach flows up into the esophagus. GERD is a disease in which the reflux happens often, causes frequent or severe symptoms, or causes problems such as damage to the esophagus.  Treatment for this condition may vary depending on how severe your symptoms are. Your health care provider may recommend diet and lifestyle changes, medicine, or surgery.  Contact a health care provider if you have new or worsening symptoms.  Take over-the-counter and prescription medicines only as told by your health care provider. Do not take aspirin, ibuprofen, or other NSAIDs unless your health care provider told you to do so.  Keep all follow-up visits as told by your health care provider. This is important. This information is not intended to replace advice given to you by your health care provider. Make sure you discuss any questions you have with your health care provider. Document Revised: 04/04/2020 Document Reviewed: 04/04/2020 Elsevier Patient Education  [place].

## 2020-12-26 NOTE — Progress Notes (Signed)
Cc'ed to pcp °

## 2020-12-26 NOTE — Progress Notes (Signed)
Primary Care Physician:  Fayrene Helper, MD  Primary Gastroenterologist:  Elon Alas. Abbey Chatters, DO (formerly Dr. Oneida Alar)   Chief Complaint  Patient presents with  . burning in throat    HPI:  Karen Cox is a 60 y.o. female here at the request of Dr. Moshe Cipro for further evaluation of GERD.  Referral came back in November 2021. Work up delayed due to patient cancelling appointment.  Patient recently seen by our practice for colonoscopy in 2013, simple adenoma removed with plans for 10-year follow-up colonoscopy.  Patient presents today with complaints of burning in the back of the throat for about a year.  States she has been given multiple medications without relief.  Some of this was allergy medication.  Given Pepcid as well, initially seem to help but it quit.  She therefore stopped the medication.  Over the course of the past year she has gained nearly 70 pounds.  He contributes this to poor dietary choices.  More recently has tried cutting out fried/heavy food.  24-hour recall, breakfast consisted of cereal/apple juice, supper consisted of meat loaf.  Consumes diet drinks and other juices.   Notes the burning in her throat typically occurs mostly in the evenings after she lays down.  Has some symptoms in the day but not bad.  At nighttime it really bothers her.  She gargles with Listerine or induces vomiting to try to get relief.  She denies abdominal pain.  Sometimes when she eats she feels like food sticks in the upper chest and she has to vomit it back up.  Denies pill dysphagia.  Bowel movements regular.  No blood in stool or melena.   Patient admits to laying down right after she eats.  Sometimes eats while lying down in bed.   Current Outpatient Medications  Medication Sig Dispense Refill  . Blood Pressure Monitoring (BLOOD PRESSURE MONITOR AUTOMAT) DEVI 1 each by Does not apply route daily. 1 each 0  . buPROPion (WELLBUTRIN XL) 300 MG 24 hr tablet Take 1 tablet (300 mg  total) by mouth every morning. 90 tablet 0  . cetirizine (ZYRTEC) 10 MG tablet TAKE (1) TABLET BY MOUTH TWICE DAILY. 60 tablet 0  . cyclobenzaprine (FLEXERIL) 5 MG tablet Take one tablet at bedtime as needed for back pain and spasm 15 tablet 0  . ESTRING 2 MG vaginal ring SMARTSIG:Ring Vaginal    . hydrOXYzine (VISTARIL) 25 MG capsule Take 1 capsule (25 mg total) by mouth 3 (three) times daily as needed. For severe anxiety attacks only 90 capsule 1  . medroxyPROGESTERone (PROVERA) 5 MG tablet Take 5 mg by mouth daily.    . Multiple Vitamins-Minerals (HAIR SKIN NAILS PO) Take by mouth daily.    Marland Kitchen OVER THE COUNTER MEDICATION Take 1 capsule by mouth daily. Natural vitamins & minerals formula for healthier hair    . PARoxetine (PAXIL) 20 MG tablet TAKE 1 AND 1/2 TABLET BY MOUTH DAILY 135 tablet 0  . PREMARIN vaginal cream USE A SMALL AMOUNT OFACREAM 3-4 TIMES PER WEEK.    . propranolol ER (INDERAL LA) 60 MG 24 hr capsule Take 1 capsule (60 mg total) by mouth daily. 180 capsule 1  . QUEtiapine (SEROQUEL XR) 50 MG TB24 24 hr tablet TAKE (2) TABLETS BY MOUTH AT BEDTIME. 180 tablet 0  .        Current Facility-Administered Medications  Medication Dose Route Frequency Provider Last Rate Last Admin  . omalizumab Arvid Right) injection 300 mg  300 mg  Subcutaneous Q28 days Valentina Shaggy, MD   300 mg at 12/31/17 1035    Allergies as of 12/26/2020 - Review Complete 12/26/2020  Allergen Reaction Noted  . Benadryl  [diphenhydramine hcl (sleep)]    . Benadryl [diphenhydramine hcl] Other (See Comments) 12/31/2010  . Dextromethorphan hbr Hives 05/29/2016  . Robitussin (alcohol free) [guaifenesin]  05/17/2014  . Aspirin Rash and Hives 05/08/2011  . Diphenhydramine hcl Hives and Rash 03/01/2016  . Latex Rash 09/04/2011    Past Medical History:  Diagnosis Date  . Allergy   . Anxiety   . Cervical high risk HPV (human papillomavirus) test positive 05/17/2014  . Depression   . Eczema   . GERD  (gastroesophageal reflux disease)   . Hot flashes 09/01/2013  . Hyperlipidemia   . Mild mental slowing   . Peri-menopause 08/05/2013  . PMB (postmenopausal bleeding) 11/16/2013  . Urticaria   . Vaginal itching 08/05/2013  . Yeast infection 08/05/2013    Past Surgical History:  Procedure Laterality Date  . CERVICAL POLYPECTOMY N/A 01/06/2014   Procedure: CERVICAL POLYPECTOMY;  Surgeon: Florian Buff, MD;  Location: [facility];  Service: Gynecology;  Laterality: N/A;  . COLONOSCOPY  09/19/2012   3 mm sessile polyp removed from the colon (simple adenoma).  Advised 10-year follow-up colonoscopy  . DILITATION & CURRETTAGE/HYSTROSCOPY WITH THERMACHOICE ABLATION N/A 01/06/2014   Procedure: DILATATION & CURETTAGE/HYSTEROSCOPY WITH THERMACHOICE ABLATION;  Surgeon: Florian Buff, MD;  Location: [facility];  Service: Gynecology;  Laterality: N/A;  total therapy time:  9 minutes 12 seconds ,     temperature:87 degrees    Family History  Problem Relation Age of Onset  . Diabetes Father   . Heart disease Father   . Hypertension Father   . Hyperlipidemia Father   . Diabetes Maternal Uncle   . Anemia Maternal Uncle   . Arthritis Maternal Uncle   . Anxiety disorder Maternal Uncle   . Arthritis Maternal Grandmother   . Anxiety disorder Maternal Grandmother   . Heart disease Maternal Grandfather        pacemaker  . Anxiety disorder Daughter   . Colon cancer Neg Hx   . Cancer Neg Hx   . Allergic rhinitis Neg Hx   . Angioedema Neg Hx   . Asthma Neg Hx   . Eczema Neg Hx   . Immunodeficiency Neg Hx   . Urticaria Neg Hx   . Atopy Neg Hx     Social History   Socioeconomic History  . Marital status: Single    Spouse name: Not on file  . Number of children: Not on file  . Years of education: Not on file  . Highest education level: Not on file  Occupational History  . Not on file  Tobacco Use  . Smoking status: Former Smoker    Packs/day: 0.25    Years: 3.00    Pack years: 0.75    Types:  Cigarettes    Quit date: 09/03/1981    Years since quitting: 39.3  . Smokeless tobacco: Never Used  Substance and Sexual Activity  . Alcohol use: No  . Drug use: No  . Sexual activity: Never    Birth control/protection: Post-menopausal  Other Topics Concern  . Not on file  Social History Narrative  . Not on file   Social Determinants of Health   Financial Resource Strain: Not on file  Food Insecurity: Not on file  Transportation Needs: Not on file  Physical Activity: Not on file  Stress: Not on file  Social Connections: Not on file  Intimate Partner Violence: Not on file      ROS:  General: Negative for anorexia, weight loss, fever, chills, fatigue, weakness. Eyes: Negative for vision changes.  ENT: Negative for hoarseness,  nasal congestion.  See HPI CV: Negative for chest pain, angina, palpitations, dyspnea on exertion, peripheral edema.  Respiratory: Negative for dyspnea at rest, dyspnea on exertion, cough, sputum, wheezing.  GI: See history of present illness. GU:  Negative for dysuria, hematuria, urinary incontinence, urinary frequency, nocturnal urination.  MS: Negative for joint pain, low back pain.  Derm: Negative for rash or itching.  Neuro: Negative for weakness, abnormal sensation, seizure, frequent headaches, memory loss, confusion.  Psych: Negative for anxiety, depression, suicidal ideation, hallucinations.  Endo: Negative for unusual weight change.  Heme: Negative for bruising or bleeding. Allergy: Negative for rash or hives.    Physical Examination:  BP 134/86   Pulse 83   Temp (!) 96.6 F (35.9 C) (Temporal)   Ht 5\' 5"  (1.651 m)   Wt 275 lb 9.6 oz (125 kg)   LMP 06/27/2013   BMI 45.86 kg/m    General: Well-nourished, well-developed in no acute distress.  Head: Normocephalic, atraumatic.   Eyes: Conjunctiva pink, no icterus. Mouth: masked. Neck: Supple without thyromegaly, masses, or lymphadenopathy.  Lungs: Clear to auscultation  bilaterally.  Heart: Regular rate and rhythm, no murmurs rubs or gallops.  Abdomen: Bowel sounds are normal, nontender, nondistended, no hepatosplenomegaly or masses, no abdominal bruits or    hernia , no rebound or guarding.  Limited by body habitus Rectal: Not performed Extremities: No lower extremity edema. No clubbing or deformities.  Neuro: Alert and oriented x 4 , grossly normal neurologically.  Skin: Warm and dry, no rash or jaundice.   Psych: Alert and cooperative, normal mood and affect.  Labs: Lab Results  Component Value Date   WBC 6.6 03/18/2020   HGB 12.7 03/18/2020   HCT 38.3 03/18/2020   MCV 87.6 03/18/2020   PLT 253 03/18/2020   Lab Results  Component Value Date   CREATININE 0.94 03/18/2020   BUN 13 03/18/2020   NA 137 03/18/2020   K 4.7 03/18/2020   CL 106 03/18/2020   CO2 23 03/18/2020   Lab Results  Component Value Date   TSH 1.83 03/18/2020   Lab Results  Component Value Date   ALT 19 03/18/2020   AST 18 03/18/2020   ALKPHOS 101 06/05/2017   BILITOT 0.7 03/18/2020     Imaging Studies: No results found.  Assessment:  Pleasant 60 year old female presenting for further evaluation of burning in the back of the throat/neck area, worse at night with limited symptoms sometimes during the day.  Some vague dysphagia to solid foods at times.  Suspect symptoms are related to poorly controlled reflux.  Symptoms likely worsened in the setting of 70 pound weight gain over the past year.  Currently symptoms poorly managed on H2 blocker.  Would benefit from PPI therapy.  Lengthy discussion with patient regarding dietary habits, need for modest weight loss, antireflux measures.   Plan:  1. Start pantoprazole 40 mg twice daily before meals. 2. Discussed antireflux measures at length.  Provided written instructions. 3. Patient would benefit from significant weight reduction for better management of reflux symptoms as well as overall health.  She has gained 70  pounds in the past 1 year. 4. Patient was very much interested in meeting Dr. Abbey Chatters today.  We will arrange  for follow-up with Dr. Abbey Chatters in 6 weeks.  If patient is not significantly improved, she will require upper endoscopy.  She will call sooner in the interim if no noted improvement in symptoms over the next couple of weeks.

## 2020-12-30 ENCOUNTER — Other Ambulatory Visit: Payer: Self-pay

## 2020-12-30 ENCOUNTER — Encounter: Payer: Self-pay | Admitting: Nurse Practitioner

## 2020-12-30 ENCOUNTER — Ambulatory Visit (INDEPENDENT_AMBULATORY_CARE_PROVIDER_SITE_OTHER): Payer: Medicaid Other | Admitting: Nurse Practitioner

## 2020-12-30 VITALS — BP 153/83 | HR 92 | Temp 98.8°F | Resp 20 | Ht 65.0 in | Wt 277.0 lb

## 2020-12-30 DIAGNOSIS — E785 Hyperlipidemia, unspecified: Secondary | ICD-10-CM

## 2020-12-30 DIAGNOSIS — I10 Essential (primary) hypertension: Secondary | ICD-10-CM | POA: Diagnosis not present

## 2020-12-30 NOTE — Assessment & Plan Note (Addendum)
-  BPmanually was 132/84, so no new medicines added today

## 2020-12-30 NOTE — Progress Notes (Signed)
Acute Office Visit  Subjective:    Patient ID: Karen Cox, female    DOB: August 10, 1961, 60 y.o.   MRN: [id number]  Chief Complaint  Patient presents with  . Hypertension  . Rash    Resolving.     HPI Patient is in today for BP check At her last OV, we increased her propranolol d/t BP 134/94. No adverse med effects.  Past Medical History:  Diagnosis Date  . Allergy   . Anxiety   . Cervical high risk HPV (human papillomavirus) test positive 05/17/2014  . Depression   . Eczema   . GERD (gastroesophageal reflux disease)   . Hot flashes 09/01/2013  . Hyperlipidemia   . Mild mental slowing   . Peri-menopause 08/05/2013  . PMB (postmenopausal bleeding) 11/16/2013  . Urticaria   . Vaginal itching 08/05/2013  . Yeast infection 08/05/2013    Past Surgical History:  Procedure Laterality Date  . CERVICAL POLYPECTOMY N/A 01/06/2014   Procedure: CERVICAL POLYPECTOMY;  Surgeon: Florian Buff, MD;  Location: [facility];  Service: Gynecology;  Laterality: N/A;  . COLONOSCOPY  09/19/2012   3 mm sessile polyp removed from the colon (simple adenoma).  Advised 10-year follow-up colonoscopy  . DILITATION & CURRETTAGE/HYSTROSCOPY WITH THERMACHOICE ABLATION N/A 01/06/2014   Procedure: DILATATION & CURETTAGE/HYSTEROSCOPY WITH THERMACHOICE ABLATION;  Surgeon: Florian Buff, MD;  Location: [facility];  Service: Gynecology;  Laterality: N/A;  total therapy time:  9 minutes 12 seconds ,     temperature:87 degrees    Family History  Problem Relation Age of Onset  . Diabetes Father   . Heart disease Father   . Hypertension Father   . Hyperlipidemia Father   . Diabetes Maternal Uncle   . Anemia Maternal Uncle   . Arthritis Maternal Uncle   . Anxiety disorder Maternal Uncle   . Arthritis Maternal Grandmother   . Anxiety disorder Maternal Grandmother   . Heart disease Maternal Grandfather        pacemaker  . Anxiety disorder Daughter   . Colon cancer Neg Hx   . Cancer Neg Hx   . Allergic  rhinitis Neg Hx   . Angioedema Neg Hx   . Asthma Neg Hx   . Eczema Neg Hx   . Immunodeficiency Neg Hx   . Urticaria Neg Hx   . Atopy Neg Hx     Social History   Socioeconomic History  . Marital status: Single    Spouse name: Not on file  . Number of children: Not on file  . Years of education: Not on file  . Highest education level: Not on file  Occupational History  . Not on file  Tobacco Use  . Smoking status: Former Smoker    Packs/day: 0.25    Years: 3.00    Pack years: 0.75    Types: Cigarettes    Quit date: 09/03/1981    Years since quitting: 39.3  . Smokeless tobacco: Never Used  Substance and Sexual Activity  . Alcohol use: No  . Drug use: No  . Sexual activity: Never    Birth control/protection: Post-menopausal  Other Topics Concern  . Not on file  Social History Narrative  . Not on file   Social Determinants of Health   Financial Resource Strain: Not on file  Food Insecurity: Not on file  Transportation Needs: Not on file  Physical Activity: Not on file  Stress: Not on file  Social Connections: Not on file  Intimate Partner  Violence: Not on file    Outpatient Medications Prior to Visit  Medication Sig Dispense Refill  . Blood Pressure Monitoring (BLOOD PRESSURE MONITOR AUTOMAT) DEVI 1 each by Does not apply route daily. 1 each 0  . cetirizine (ZYRTEC) 10 MG tablet TAKE (1) TABLET BY MOUTH TWICE DAILY. 60 tablet 0  . cyclobenzaprine (FLEXERIL) 5 MG tablet Take one tablet at bedtime as needed for back pain and spasm 15 tablet 0  . ESTRING 2 MG vaginal ring SMARTSIG:Ring Vaginal    . hydrOXYzine (VISTARIL) 25 MG capsule Take 1 capsule (25 mg total) by mouth 3 (three) times daily as needed. For severe anxiety attacks only 90 capsule 1  . medroxyPROGESTERone (PROVERA) 5 MG tablet Take 5 mg by mouth daily.    . Multiple Vitamins-Minerals (HAIR SKIN NAILS PO) Take by mouth daily.    Marland Kitchen OVER THE COUNTER MEDICATION Take 1 capsule by mouth daily. Natural  vitamins & minerals formula for healthier hair    . pantoprazole (PROTONIX) 40 MG tablet Take 1 tablet (40 mg total) by mouth 2 (two) times daily before a meal. 60 tablet 5  . PARoxetine (PAXIL) 20 MG tablet TAKE 1 AND 1/2 TABLET BY MOUTH DAILY 135 tablet 0  . PREMARIN vaginal cream USE A SMALL AMOUNT OFACREAM 3-4 TIMES PER WEEK.    . propranolol ER (INDERAL LA) 60 MG 24 hr capsule Take 1 capsule (60 mg total) by mouth daily. 180 capsule 1  . QUEtiapine (SEROQUEL XR) 50 MG TB24 24 hr tablet TAKE (2) TABLETS BY MOUTH AT BEDTIME. 180 tablet 0  . buPROPion (WELLBUTRIN XL) 300 MG 24 hr tablet Take 1 tablet (300 mg total) by mouth every morning. 90 tablet 0   Facility-Administered Medications Prior to Visit  Medication Dose Route Frequency Provider Last Rate Last Admin  . omalizumab Arvid Right) injection 300 mg  300 mg Subcutaneous Q28 days Valentina Shaggy, MD   300 mg at 12/31/17 1035    Allergies  Allergen Reactions  . Benadryl  [Diphenhydramine Hcl (Sleep)]   . Benadryl [Diphenhydramine Hcl] Other (See Comments)    Makes congestion worst   . Dextromethorphan Hbr Hives  . Robitussin (Alcohol Free) [Guaifenesin]     Makes cough worse  . Aspirin Rash and Hives    rash  . Diphenhydramine Hcl Hives and Rash    Makes congestion worst   . Latex Rash    Review of Systems  Constitutional: Negative.   Respiratory: Negative.   Cardiovascular: Negative.        Objective:    Physical Exam Constitutional:      Appearance: Normal appearance.  Cardiovascular:     Rate and Rhythm: Normal rate and regular rhythm.     Pulses: Normal pulses.     Heart sounds: Normal heart sounds.  Pulmonary:     Effort: Pulmonary effort is normal.     Breath sounds: Normal breath sounds.  Neurological:     Mental Status: She is alert.     BP (!) 153/83   Pulse 92   Temp 98.8 F (37.1 C)   Resp 20   Ht 5\' 5"  (1.651 m)   Wt 277 lb (125.6 kg)   LMP 06/27/2013   SpO2 96%   BMI 46.10 kg/m  Wt  Readings from Last 3 Encounters:  12/30/20 277 lb (125.6 kg)  12/26/20 275 lb 9.6 oz (125 kg)  12/02/20 270 lb (122.5 kg)    Health Maintenance Due  Topic Date Due  .  COVID-19 Vaccine (2 - Booster for Janssen series) 02/22/2020    There are no preventive care reminders to display for this patient.   Lab Results  Component Value Date   TSH 1.83 03/18/2020   Lab Results  Component Value Date   WBC 6.6 03/18/2020   HGB 12.7 03/18/2020   HCT 38.3 03/18/2020   MCV 87.6 03/18/2020   PLT 253 03/18/2020   Lab Results  Component Value Date   NA 137 03/18/2020   K 4.7 03/18/2020   CO2 23 03/18/2020   GLUCOSE 89 03/18/2020   BUN 13 03/18/2020   CREATININE 0.94 03/18/2020   BILITOT 0.7 03/18/2020   ALKPHOS 101 06/05/2017   AST 18 03/18/2020   ALT 19 03/18/2020   PROT 7.3 03/18/2020   ALBUMIN 4.3 06/05/2017   CALCIUM 9.6 03/18/2020   ANIONGAP 10 08/27/2019   Lab Results  Component Value Date   CHOL 221 (H) 03/18/2020   Lab Results  Component Value Date   HDL 59 03/18/2020   Lab Results  Component Value Date   LDLCALC 134 (H) 03/18/2020   Lab Results  Component Value Date   TRIG 149 03/18/2020   Lab Results  Component Value Date   CHOLHDL 3.7 03/18/2020   Lab Results  Component Value Date   HGBA1C 4.9 08/18/2013       Assessment & Plan:   Problem List Items Addressed This Visit      Cardiovascular and Mediastinum   Essential hypertension    -BPmanually was 132/84, so no new medicines added today          No orders of the defined types were placed in this encounter.    Noreene Larsson, NP

## 2020-12-30 NOTE — Addendum Note (Signed)
Addended by: Demetrius Revel on: 12/30/2020 12:29 PM   Modules accepted: Orders

## 2020-12-30 NOTE — Patient Instructions (Signed)
She has a dermatology referral with Dr. Valere Dross that says ready to schedule, so she can call them to set up an appointment time.  No medicine added because her BP was excellent when we checked it manually.

## 2021-01-12 ENCOUNTER — Other Ambulatory Visit: Payer: Self-pay | Admitting: Psychiatry

## 2021-01-12 DIAGNOSIS — F251 Schizoaffective disorder, depressive type: Secondary | ICD-10-CM

## 2021-01-17 ENCOUNTER — Ambulatory Visit (INDEPENDENT_AMBULATORY_CARE_PROVIDER_SITE_OTHER): Payer: Medicaid Other | Admitting: Psychiatry

## 2021-01-17 ENCOUNTER — Other Ambulatory Visit: Payer: Self-pay

## 2021-01-17 ENCOUNTER — Encounter: Payer: Self-pay | Admitting: Psychiatry

## 2021-01-17 VITALS — BP 139/88 | HR 89 | Temp 97.8°F | Wt 279.2 lb

## 2021-01-17 DIAGNOSIS — R4183 Borderline intellectual functioning: Secondary | ICD-10-CM

## 2021-01-17 DIAGNOSIS — F251 Schizoaffective disorder, depressive type: Secondary | ICD-10-CM

## 2021-01-17 DIAGNOSIS — F411 Generalized anxiety disorder: Secondary | ICD-10-CM | POA: Diagnosis not present

## 2021-01-17 MED ORDER — HYDROXYZINE PAMOATE 25 MG PO CAPS: 25.0000 mg | ORAL_CAPSULE | Freq: Three times a day (TID) | ORAL | 1 refills | Status: DC | PRN

## 2021-01-17 MED ORDER — BUPROPION HCL ER (XL) 300 MG PO TB24: 300.0000 mg | ORAL_TABLET | ORAL | 0 refills | Status: DC

## 2021-01-17 NOTE — Progress Notes (Signed)
[facility] MD OP Progress Note  01/18/2021 10:38 AM Karen Cox  MRN:  [id number]  Chief Complaint:  Chief Complaint    Follow-up; Anxiety; Hallucinations     HPI: Karen Cox is a 60 year old African-American female, lives in [place], has a history of GAD, schizoaffective disorder, borderline intellectual functioning, morbid obesity, seasonal allergies, insomnia was evaluated in office today.  Patient reports she continues to have anxiety when it comes to going to unfamiliar places or when she has to drive her vehicle.  She reports she is trying to get her driver's license and has a learner's permit at this time.  Patient otherwise denies any significant anxiety attacks or panic attacks.  Denies any mood swings.  Reports she is not depressed anymore.  She reports appetite is fair.  Patient reports sleep is overall okay.  Patient does report auditory hallucinations of name-calling on and off however reports it does not bother her much.  Patient did not appear to be preoccupied with any delusions or paranoid ideation.  Patient denies any suicidality or homicidality.  Patient denies any other concerns today.  Visit Diagnosis:    ICD-10-CM   1. Schizoaffective disorder, depressive type ([place])  F25.1   2. GAD (generalized anxiety disorder)  F41.1 hydrOXYzine (VISTARIL) 25 MG capsule    buPROPion (WELLBUTRIN XL) 300 MG 24 hr tablet  3. Borderline intellectual functioning  R41.83     Past Psychiatric History: I have reviewed past psychiatric history from my progress note on 12/09/2019.  Past trials of BuSpar, hydroxyzine, melatonin, Paxil, Wellbutrin  Past Medical History:  Past Medical History:  Diagnosis Date  . Allergy   . Anxiety   . Cervical high risk HPV (human papillomavirus) test positive 05/17/2014  . Depression   . Eczema   . GERD (gastroesophageal reflux disease)   . Hot flashes 09/01/2013  . Hyperlipidemia   . Mild mental slowing   . Peri-menopause  08/05/2013  . PMB (postmenopausal bleeding) 11/16/2013  . Urticaria   . Vaginal itching 08/05/2013  . Yeast infection 08/05/2013    Past Surgical History:  Procedure Laterality Date  . CERVICAL POLYPECTOMY N/A 01/06/2014   Procedure: CERVICAL POLYPECTOMY;  Surgeon: Florian Buff, MD;  Location: [facility];  Service: Gynecology;  Laterality: N/A;  . COLONOSCOPY  09/19/2012   3 mm sessile polyp removed from the colon (simple adenoma).  Advised 10-year follow-up colonoscopy  . DILITATION & CURRETTAGE/HYSTROSCOPY WITH THERMACHOICE ABLATION N/A 01/06/2014   Procedure: DILATATION & CURETTAGE/HYSTEROSCOPY WITH THERMACHOICE ABLATION;  Surgeon: Florian Buff, MD;  Location: [facility];  Service: Gynecology;  Laterality: N/A;  total therapy time:  9 minutes 12 seconds ,     temperature:87 degrees    Family Psychiatric History: I have reviewed family psychiatric history from my progress note on 12/09/2019  Family History:  Family History  Problem Relation Age of Onset  . Diabetes Father   . Heart disease Father   . Hypertension Father   . Hyperlipidemia Father   . Diabetes Maternal Uncle   . Anemia Maternal Uncle   . Arthritis Maternal Uncle   . Anxiety disorder Maternal Uncle   . Arthritis Maternal Grandmother   . Anxiety disorder Maternal Grandmother   . Heart disease Maternal Grandfather        pacemaker  . Anxiety disorder Daughter   . Colon cancer Neg Hx   . Cancer Neg Hx   . Allergic rhinitis Neg Hx   . Angioedema Neg Hx   . Asthma  Neg Hx   . Eczema Neg Hx   . Immunodeficiency Neg Hx   . Urticaria Neg Hx   . Atopy Neg Hx     Social History: I have reviewed social history from my progress note on 12/09/2019 Social History   Socioeconomic History  . Marital status: Single    Spouse name: Not on file  . Number of children: 1  . Years of education: Not on file  . Highest education level: High school graduate  Occupational History  . Not on file  Tobacco Use  . Smoking status: Former  Smoker    Packs/day: 0.25    Years: 3.00    Pack years: 0.75    Types: Cigarettes    Quit date: 09/03/1981    Years since quitting: 39.4  . Smokeless tobacco: Never Used  Vaping Use  . Vaping Use: Never used  Substance and Sexual Activity  . Alcohol use: No  . Drug use: No  . Sexual activity: Never    Birth control/protection: Post-menopausal  Other Topics Concern  . Not on file  Social History Narrative  . Not on file   Social Determinants of Health   Financial Resource Strain: Not on file  Food Insecurity: Not on file  Transportation Needs: Not on file  Physical Activity: Not on file  Stress: Not on file  Social Connections: Not on file    Allergies:  Allergies  Allergen Reactions  . Benadryl  [Diphenhydramine Hcl (Sleep)]   . Benadryl [Diphenhydramine Hcl] Other (See Comments)    Makes congestion worst   . Dextromethorphan Hbr Hives  . Robitussin (Alcohol Free) [Guaifenesin]     Makes cough worse  . Aspirin Rash and Hives    rash  . Diphenhydramine Hcl Hives and Rash    Makes congestion worst   . Latex Rash    Metabolic Disorder Labs: Lab Results  Component Value Date   HGBA1C 4.9 08/18/2013   MPG 94 08/18/2013   MPG 105 08/18/2012   No results found for: PROLACTIN Lab Results  Component Value Date   CHOL 221 (H) 03/18/2020   TRIG 149 03/18/2020   HDL 59 03/18/2020   CHOLHDL 3.7 03/18/2020   VLDL 20 08/17/2016   LDLCALC 134 (H) 03/18/2020   LDLCALC 152 (H) 10/09/2018   Lab Results  Component Value Date   TSH 1.83 03/18/2020   TSH 2.102 08/27/2019    Therapeutic Level Labs: No results found for: LITHIUM No results found for: VALPROATE No components found for:  CBMZ  Current Medications: Current Outpatient Medications  Medication Sig Dispense Refill  . Blood Pressure Monitoring (BLOOD PRESSURE MONITOR AUTOMAT) DEVI 1 each by Does not apply route daily. 1 each 0  . medroxyPROGESTERone (PROVERA) 5 MG tablet Take 5 mg by mouth daily.    .  Multiple Vitamins-Minerals (HAIR SKIN NAILS PO) Take by mouth daily.    . pantoprazole (PROTONIX) 40 MG tablet Take 1 tablet (40 mg total) by mouth 2 (two) times daily before a meal. 60 tablet 5  . PARoxetine (PAXIL) 20 MG tablet TAKE 1 AND 1/2 TABLET BY MOUTH DAILY 135 tablet 0  . propranolol ER (INDERAL LA) 60 MG 24 hr capsule Take 1 capsule (60 mg total) by mouth daily. 180 capsule 1  . QUEtiapine (SEROQUEL XR) 50 MG TB24 24 hr tablet TAKE (2) TABLETS BY MOUTH AT BEDTIME. 180 tablet 0  . buPROPion (WELLBUTRIN XL) 300 MG 24 hr tablet Take 1 tablet (300 mg total) by  mouth every morning. 90 tablet 0  . hydrOXYzine (VISTARIL) 25 MG capsule Take 1 capsule (25 mg total) by mouth 3 (three) times daily as needed. For severe anxiety attacks only 90 capsule 1   Current Facility-Administered Medications  Medication Dose Route Frequency Provider Last Rate Last Admin  . omalizumab Arvid Right) injection 300 mg  300 mg Subcutaneous Q28 days Valentina Shaggy, MD   300 mg at 12/31/17 1035     Musculoskeletal: Strength & Muscle Tone: UTA Gait & Station: normal Patient leans: N/A  Psychiatric Specialty Exam: Review of Systems  Psychiatric/Behavioral: Positive for hallucinations. The patient is nervous/anxious.   All other systems reviewed and are negative.   Blood pressure 139/88, pulse 89, temperature 97.8 F (36.6 C), temperature source Temporal, weight 279 lb 3.2 oz (126.6 kg), last menstrual period 06/27/2013.Body mass index is 46.46 kg/m.  General Appearance: Casual  Eye Contact:  Fair  Speech:  Clear and Coherent  Volume:  Normal  Mood:  Anxious coping well  Affect:  Congruent  Thought Process:  Goal Directed and Descriptions of Associations: Intact  Orientation:  Full (Time, Place, and Person)  Thought Content: Hallucinations: Auditory name calling  Suicidal Thoughts:  No  Homicidal Thoughts:  No  Memory:  Immediate;   Fair Recent;   Fair Remote;   Baseline  Judgement:  Fair   Insight:  Fair  Psychomotor Activity:  Normal  Concentration:  Concentration: Fair and Attention Span: Fair  Recall:  [employer] of Knowledge: Fair  Language: Fair  Akathisia:  No  Handed:  Right  AIMS (if indicated): done  Assets:  Communication Skills Desire for Improvement Housing Social Support  ADL's:  Intact  Cognition: Baseline  Sleep:  Fair   Screenings: GAD-7   [place] Office Visit from 10/13/2019 in [facility] Office Visit from 09/08/2019 in [facility]  Total GAD-7 Score 7 15    PHQ2-9   [facility] Visit from 01/17/2021 in [place] [facility] Visit from 12/30/2020 in [facility] Visit from 12/02/2020 in [facility] Office Visit from 11/18/2020 in [facility] Visit from 10/28/2020 in [facility]  PHQ-2 Total Score 3 0 0 0 0  PHQ-9 Total Score 7 -- -- -- --    [facility] [place] Office Visit from 01/17/2021 in [facility] No Risk       Assessment and Plan: Karen Cox is a 60 year old African-American female who lives in [place], has a history of GAD, schizoaffective disorder, borderline intellectual functioning, morbid obesity was evaluated in office today.  Patient with psychosocial stressors of pandemic, health problems.  Patient is currently stable.  Plan as noted below.  Plan Schizoaffective disorder-stable Wellbutrin 300 mg p.o. daily Paxil 30 mg p.o. daily Seroquel extended release 100 mg p.o. nightly  GAD-stable Paxil 30 mg p.o. daily Hydroxyzine 25 mg p.o. 3 times daily as needed for anxiety attacks Seroquel as prescribed  Pending labs-hemoglobin A1c, prolactin.  Patient agrees to get all her labs done at her primary care office.  She has signed a release so we can obtain lab results.  Follow-up in clinic in 3 months or sooner if needed.  This note was generated in part or  whole with voice recognition software. Voice recognition is usually quite accurate but there are transcription errors that can and very often do occur. I apologize for any typographical errors that were not detected and corrected.  Ursula Alert, MD 01/18/2021, 10:38 AM

## 2021-01-25 ENCOUNTER — Encounter: Payer: Self-pay | Admitting: Internal Medicine

## 2021-01-25 ENCOUNTER — Telehealth: Payer: Self-pay

## 2021-01-25 ENCOUNTER — Ambulatory Visit: Payer: Medicaid Other | Admitting: Internal Medicine

## 2021-01-25 NOTE — Telephone Encounter (Signed)
Augustine called and asked please call this number [phone]. she at home now.

## 2021-01-25 NOTE — Telephone Encounter (Signed)
Returned call to patient.  She reports she is making a noise at night and her sleep, her brother brought it to her attention.  She does not know if it snoring or not.  We will refer her for sleep study.

## 2021-01-25 NOTE — Telephone Encounter (Signed)
pt states that her brother told her that when she sleeps she makes noises. she states that she didnt think she was snoring. but wondered if she needs medication changed or find out what happing when she sleeping.

## 2021-01-25 NOTE — Telephone Encounter (Signed)
Pt informed

## 2021-01-25 NOTE — Telephone Encounter (Signed)
Patient called needs to know what her normal pulse should be.  Call back # [phone].

## 2021-01-31 ENCOUNTER — Telehealth: Payer: Medicaid Other

## 2021-02-01 ENCOUNTER — Other Ambulatory Visit: Payer: Self-pay

## 2021-02-01 ENCOUNTER — Telehealth (INDEPENDENT_AMBULATORY_CARE_PROVIDER_SITE_OTHER): Payer: Medicaid Other | Admitting: Licensed Clinical Social Worker

## 2021-02-01 ENCOUNTER — Telehealth: Payer: Self-pay | Admitting: Licensed Clinical Social Worker

## 2021-02-01 DIAGNOSIS — F251 Schizoaffective disorder, depressive type: Secondary | ICD-10-CM

## 2021-02-01 DIAGNOSIS — R4183 Borderline intellectual functioning: Secondary | ICD-10-CM

## 2021-02-01 NOTE — BH Specialist Note (Signed)
Virtual Behavioral Health Treatment Plan Team Note  MRN: [id number] NAME: Karen Cox  DATE: 02/01/21  Start time:  430p End time:  440p Total time:  23min  Total number of [facility] Treatment Team Plan encounters: 1/4  Treatment Team Attendees: Royal Piedra, LCSW, Dr. Modesta Messing, Psychiatrist  Diagnoses: No diagnosis found.  Goals, Interventions and Follow-up Plan Goals:  reduce symptoms Interventions:  CBT, MI Medication Management Recommendations: n/a Follow-up Plan:  She will be followed by [name] for medication management and she will be referred to a therapist in [place] based on her request. We will sign off.    Psychiatric History  Depression: No Anxiety: Yes Mania: Yes Psychosis: No PTSD symptoms: No  Past Psychiatric History/Hospitalization(s): Hospitalization for psychiatric illness: No Prior Suicide Attempts: No Prior Self-injurious behavior: No  Psychosocial stressors   Self-harm Behaviors Risk Assessment   Screenings PHQ-9 Assessments:  Depression screen [facility] 2/9 01/17/2021 12/30/2020 12/02/2020  Decreased Interest 2 0 0  Down, Depressed, Hopeless 1 0 0  PHQ - 2 Score 3 0 0  Altered sleeping 0 - -  Tired, decreased energy 2 - -  Change in appetite 0 - -  Feeling bad or failure about yourself  1 - -  Trouble concentrating 0 - -  Moving slowly or fidgety/restless 1 - -  Suicidal thoughts 0 - -  PHQ-9 Score 7 - -  Difficult doing work/chores Very difficult - -  Some recent data might be hidden   GAD-7 Assessments:  GAD 7 : Generalized Anxiety Score 10/13/2019 10/13/2019 09/08/2019 09/08/2019  Nervous, Anxious, on Edge 1 2 2 1   Control/stop worrying 1 1 3 1   Worry too much - different things 1 0 3 2  Trouble relaxing 1 1 2 1   Restless 0 1 2 3   Easily annoyed or irritable 1 1 2 1   Afraid - awful might happen 2 2 1 1   Total GAD 7 Score 7 8 15 10   Anxiety Difficulty - Very difficult - Somewhat difficult    Past Medical History Past  Medical History:  Diagnosis Date  . Allergy   . Anxiety   . Cervical high risk HPV (human papillomavirus) test positive 05/17/2014  . Depression   . Eczema   . GERD (gastroesophageal reflux disease)   . Hot flashes 09/01/2013  . Hyperlipidemia   . Mild mental slowing   . Peri-menopause 08/05/2013  . PMB (postmenopausal bleeding) 11/16/2013  . Urticaria   . Vaginal itching 08/05/2013  . Yeast infection 08/05/2013    Vital signs: There were no vitals filed for this visit.  Allergies:  Allergies as of 02/01/2021 - Review Complete 01/17/2021  Allergen Reaction Noted  . Benadryl  [diphenhydramine hcl (sleep)]    . Benadryl [diphenhydramine hcl] Other (See Comments) 12/31/2010  . Dextromethorphan hbr Hives 05/29/2016  . Robitussin (alcohol free) [guaifenesin]  05/17/2014  . Aspirin Rash and Hives 05/08/2011  . Diphenhydramine hcl Hives and Rash 03/01/2016  . Latex Rash 09/04/2011    Medication History Current medications:  Outpatient Encounter Medications as of 02/01/2021  Medication Sig  . Blood Pressure Monitoring (BLOOD PRESSURE MONITOR AUTOMAT) DEVI 1 each by Does not apply route daily.  Marland Kitchen buPROPion (WELLBUTRIN XL) 300 MG 24 hr tablet Take 1 tablet (300 mg total) by mouth every morning.  . hydrOXYzine (VISTARIL) 25 MG capsule Take 1 capsule (25 mg total) by mouth 3 (three) times daily as needed. For severe anxiety attacks only  . medroxyPROGESTERone (PROVERA) 5 MG tablet  Take 5 mg by mouth daily.  . Multiple Vitamins-Minerals (HAIR SKIN NAILS PO) Take by mouth daily.  . pantoprazole (PROTONIX) 40 MG tablet Take 1 tablet (40 mg total) by mouth 2 (two) times daily before a meal.  . PARoxetine (PAXIL) 20 MG tablet TAKE 1 AND 1/2 TABLET BY MOUTH DAILY  . propranolol ER (INDERAL LA) 60 MG 24 hr capsule Take 1 capsule (60 mg total) by mouth daily.  . QUEtiapine (SEROQUEL XR) 50 MG TB24 24 hr tablet TAKE (2) TABLETS BY MOUTH AT BEDTIME.   Facility-Administered Encounter Medications  as of 02/01/2021  Medication  . omalizumab Arvid Right) injection 300 mg     Scribe for Treatment Team: Lubertha South, LCSW

## 2021-02-09 NOTE — Progress Notes (Signed)
Patient reports that she wants to attend Therapy in the [place] office.  Therapist has referred

## 2021-02-14 ENCOUNTER — Telehealth: Payer: Self-pay

## 2021-02-14 DIAGNOSIS — F321 Major depressive disorder, single episode, moderate: Secondary | ICD-10-CM

## 2021-02-14 NOTE — Telephone Encounter (Signed)
Patient called request a referral for behavorial. patient call back # [phone].

## 2021-02-15 NOTE — Telephone Encounter (Signed)
Yes pls do,

## 2021-02-15 NOTE — Telephone Encounter (Signed)
Ok to schedule appt with [facility] clinician?

## 2021-02-15 NOTE — Addendum Note (Signed)
Addended by: Eual Fines on: 02/15/2021 02:58 PM   Modules accepted: Orders

## 2021-03-10 ENCOUNTER — Other Ambulatory Visit: Payer: Self-pay | Admitting: Psychiatry

## 2021-03-10 DIAGNOSIS — F251 Schizoaffective disorder, depressive type: Secondary | ICD-10-CM

## 2021-03-22 ENCOUNTER — Telehealth: Payer: Self-pay

## 2021-03-22 ENCOUNTER — Ambulatory Visit: Payer: Medicaid Other | Admitting: Internal Medicine

## 2021-03-22 ENCOUNTER — Other Ambulatory Visit: Payer: Self-pay

## 2021-03-22 NOTE — Telephone Encounter (Signed)
Karen Cox is calling to advise that something is off with her moms medication, and she is sleeping to much, that . Pt also has low energy

## 2021-03-22 NOTE — Telephone Encounter (Signed)
Patient called will speak with daughter before scheduling this appointment.

## 2021-03-22 NOTE — Telephone Encounter (Signed)
Would need an appt and need to bring all the meds she is currently taking

## 2021-04-03 ENCOUNTER — Ambulatory Visit: Payer: Medicaid Other | Admitting: Nurse Practitioner

## 2021-04-18 ENCOUNTER — Telehealth: Payer: Self-pay

## 2021-04-18 ENCOUNTER — Encounter: Payer: Self-pay | Admitting: Psychiatry

## 2021-04-18 ENCOUNTER — Telehealth (INDEPENDENT_AMBULATORY_CARE_PROVIDER_SITE_OTHER): Payer: Medicaid Other | Admitting: Psychiatry

## 2021-04-18 ENCOUNTER — Other Ambulatory Visit: Payer: Self-pay

## 2021-04-18 DIAGNOSIS — F411 Generalized anxiety disorder: Secondary | ICD-10-CM | POA: Diagnosis not present

## 2021-04-18 DIAGNOSIS — Z79899 Other long term (current) drug therapy: Secondary | ICD-10-CM | POA: Diagnosis not present

## 2021-04-18 DIAGNOSIS — F251 Schizoaffective disorder, depressive type: Secondary | ICD-10-CM

## 2021-04-18 DIAGNOSIS — R4183 Borderline intellectual functioning: Secondary | ICD-10-CM | POA: Diagnosis not present

## 2021-04-18 MED ORDER — BUPROPION HCL ER (XL) 300 MG PO TB24
300.0000 mg | ORAL_TABLET | ORAL | 0 refills | Status: DC
Start: 2021-04-18 — End: 2021-07-25

## 2021-04-18 MED ORDER — PAROXETINE HCL 20 MG PO TABS: 30.0000 mg | ORAL_TABLET | Freq: Every day | ORAL | 0 refills | Status: DC

## 2021-04-18 NOTE — Telephone Encounter (Signed)
certified mailed labwork orders for A1C , Prolactin, and TSH  dx: [phone]

## 2021-04-18 NOTE — Progress Notes (Signed)
Virtual Visit via Video Note  I connected with Karen Cox on 04/18/21 at 11:30 AM EDT by a video enabled telemedicine application and verified that I am speaking with the correct person using two identifiers.  Location Provider Location : [facility] Patient Location : Home  Participants: Patient , Provider   I discussed the limitations of evaluation and management by telemedicine and the availability of in person appointments. The patient expressed understanding and agreed to proceed.    I discussed the assessment and treatment plan with the patient. The patient was provided an opportunity to ask questions and all were answered. The patient agreed with the plan and demonstrated an understanding of the instructions.   The patient was advised to call back or seek an in-person evaluation if the symptoms worsen or if the condition fails to improve as anticipated.   [facility] MD OP Progress Note  04/18/2021 11:34 PM Karen Cox  MRN:  [id number]  Chief Complaint:  Chief Complaint   Follow-up; Depression; Hallucinations    HPI: Karen Cox is a 60 year old African-American female, lives in [place], has a history of GAD, schizoaffective disorder, borderline intellectual functioning, morbid obesity, seasonal allergies, insomnia was evaluated by telemedicine today.  Patient today reports her anxiety symptoms as stable.  She denies any significant panic attacks.  She reports her mood as stable, denies any mood swings, irritability.  Denies any hallucinations or perceptual disturbances.  Reports appetite as fair.  She is compliant on medications as prescribed.  Patient reports her sleep as good and even though she does have snoring, currently does not have any other problems and she wakes up feeling rested in the morning.  She reports she is not interested in a sleep study at this time.  Patient denies any other concerns today.  Visit Diagnosis:    ICD-10-CM   1.  Schizoaffective disorder, depressive type ([place])  F25.1 PARoxetine (PAXIL) 20 MG tablet    2. GAD (generalized anxiety disorder)  F41.1 buPROPion (WELLBUTRIN XL) 300 MG 24 hr tablet    3. Borderline intellectual functioning  R41.83     4. High risk medication use  Z79.899 Hemoglobin A1C    Prolactin    TSH      Past Psychiatric History: I have reviewed past psychiatric history from progress note on 12/09/2019.  Past trials of BuSpar, hydroxyzine, melatonin, Paxil, Wellbutrin  Past Medical History:  Past Medical History:  Diagnosis Date   Allergy    Anxiety    Cervical high risk HPV (human papillomavirus) test positive 05/17/2014   Depression    Eczema    GERD (gastroesophageal reflux disease)    Hot flashes 09/01/2013   Hyperlipidemia    Mild mental slowing    Peri-menopause 08/05/2013   PMB (postmenopausal bleeding) 11/16/2013   Urticaria    Vaginal itching 08/05/2013   Yeast infection 08/05/2013    Past Surgical History:  Procedure Laterality Date   CERVICAL POLYPECTOMY N/A 01/06/2014   Procedure: CERVICAL POLYPECTOMY;  Surgeon: Florian Buff, MD;  Location: [facility];  Service: Gynecology;  Laterality: N/A;   COLONOSCOPY  09/19/2012   3 mm sessile polyp removed from the colon (simple adenoma).  Advised 10-year follow-up colonoscopy   DILITATION & CURRETTAGE/HYSTROSCOPY WITH THERMACHOICE ABLATION N/A 01/06/2014   Procedure: DILATATION & CURETTAGE/HYSTEROSCOPY WITH THERMACHOICE ABLATION;  Surgeon: Florian Buff, MD;  Location: [facility];  Service: Gynecology;  Laterality: N/A;  total therapy time:  9 minutes 12 seconds ,     temperature:87  degrees    Family Psychiatric History: Reviewed family psychiatric history from progress note on 12/09/2019  Family History:  Family History  Problem Relation Age of Onset   Diabetes Father    Heart disease Father    Hypertension Father    Hyperlipidemia Father    Diabetes Maternal Uncle    Anemia Maternal Uncle    Arthritis Maternal Uncle     Anxiety disorder Maternal Uncle    Arthritis Maternal Grandmother    Anxiety disorder Maternal Grandmother    Heart disease Maternal Grandfather        pacemaker   Anxiety disorder Daughter    Colon cancer Neg Hx    Cancer Neg Hx    Allergic rhinitis Neg Hx    Angioedema Neg Hx    Asthma Neg Hx    Eczema Neg Hx    Immunodeficiency Neg Hx    Urticaria Neg Hx    Atopy Neg Hx     Social History: Reviewed social history from progress note on 12/09/2019 Social History   Socioeconomic History   Marital status: Single    Spouse name: Not on file   Number of children: 1   Years of education: Not on file   Highest education level: High school graduate  Occupational History   Not on file  Tobacco Use   Smoking status: Former    Packs/day: 0.25    Years: 3.00    Pack years: 0.75    Types: Cigarettes    Quit date: 09/03/1981    Years since quitting: 39.6   Smokeless tobacco: Never  Vaping Use   Vaping Use: Never used  Substance and Sexual Activity   Alcohol use: No   Drug use: No   Sexual activity: Never    Birth control/protection: Post-menopausal  Other Topics Concern   Not on file  Social History Narrative   Not on file   Social Determinants of Health   Financial Resource Strain: Not on file  Food Insecurity: Not on file  Transportation Needs: Not on file  Physical Activity: Not on file  Stress: Not on file  Social Connections: Not on file    Allergies:  Allergies  Allergen Reactions   Benadryl  [Diphenhydramine Hcl (Sleep)]    Benadryl [Diphenhydramine Hcl] Other (See Comments)    Makes congestion worst    Dextromethorphan Hbr Hives   Robitussin (Alcohol Free) [Guaifenesin]     Makes cough worse   Aspirin Rash and Hives    rash   Diphenhydramine Hcl Hives and Rash    Makes congestion worst    Latex Rash    Metabolic Disorder Labs: Lab Results  Component Value Date   HGBA1C 4.9 08/18/2013   MPG 94 08/18/2013   MPG 105 08/18/2012   No results  found for: PROLACTIN Lab Results  Component Value Date   CHOL 221 (H) 03/18/2020   TRIG 149 03/18/2020   HDL 59 03/18/2020   CHOLHDL 3.7 03/18/2020   VLDL 20 08/17/2016   LDLCALC 134 (H) 03/18/2020   LDLCALC 152 (H) 10/09/2018   Lab Results  Component Value Date   TSH 1.83 03/18/2020   TSH 2.102 08/27/2019    Therapeutic Level Labs: No results found for: LITHIUM No results found for: VALPROATE No components found for:  CBMZ  Current Medications: Current Outpatient Medications  Medication Sig Dispense Refill   Blood Pressure Monitoring (BLOOD PRESSURE MONITOR AUTOMAT) DEVI 1 each by Does not apply route daily. 1 each 0  buPROPion (WELLBUTRIN XL) 300 MG 24 hr tablet Take 1 tablet (300 mg total) by mouth every morning. 90 tablet 0   ESTRING 2 MG vaginal ring SMARTSIG:Ring Vaginal     hydrOXYzine (VISTARIL) 25 MG capsule Take 1 capsule (25 mg total) by mouth 3 (three) times daily as needed. For severe anxiety attacks only 90 capsule 1   medroxyPROGESTERone (PROVERA) 5 MG tablet Take 5 mg by mouth daily.     Multiple Vitamins-Minerals (HAIR SKIN NAILS PO) Take by mouth daily.     pantoprazole (PROTONIX) 40 MG tablet Take 1 tablet (40 mg total) by mouth 2 (two) times daily before a meal. 60 tablet 5   PARoxetine (PAXIL) 20 MG tablet Take 1.5 tablets (30 mg total) by mouth daily. 135 tablet 0   propranolol ER (INDERAL LA) 60 MG 24 hr capsule Take 1 capsule (60 mg total) by mouth daily. 180 capsule 1   QUEtiapine (SEROQUEL XR) 50 MG TB24 24 hr tablet TAKE (2) TABLETS BY MOUTH AT BEDTIME. 180 tablet 0   Current Facility-Administered Medications  Medication Dose Route Frequency Provider Last Rate Last Admin   omalizumab Arvid Right) injection 300 mg  300 mg Subcutaneous Q28 days Valentina Shaggy, MD   300 mg at 12/31/17 1035     Musculoskeletal: Strength & Muscle Tone:  UTA Gait & Station:  UTA Patient leans: N/A  Psychiatric Specialty Exam: Review of Systems   Psychiatric/Behavioral:  Positive for hallucinations (auditory).   All other systems reviewed and are negative.  Last menstrual period 06/27/2013.There is no height or weight on file to calculate BMI.  General Appearance: Casual  Eye Contact:  Fair  Speech:  Clear and Coherent  Volume:  Normal  Mood:  Euthymic  Affect:  Appropriate  Thought Process:  Goal Directed and Descriptions of Associations: Intact  Orientation:  Full (Time, Place, and Person)  Thought Content: Hallucinations: Auditory chronic , name calling - does not bother her  Suicidal Thoughts:  No  Homicidal Thoughts:  No  Memory:  Immediate;   Fair Recent;   Fair Remote;   Fair  Judgement:  Fair  Insight:  Fair  Psychomotor Activity:  Normal  Concentration:  Concentration: Fair and Attention Span: Fair  Recall:  [employer] of Knowledge: Fair  Language: Fair  Akathisia:  No  Handed:  Right  AIMS (if indicated): not done  Assets:  Communication Skills Desire for Improvement Housing Social Support  ADL's:  Intact  Cognition: WNL  Sleep:  Fair   Screenings: GAD-7    [place] Office Visit from 10/13/2019 in [facility] Office Visit from 09/08/2019 in [facility]  Total GAD-7 Score 7 15      PHQ2-9    Flowsheet Row Video Visit from 04/18/2021 in [facility] Visit from 01/17/2021 in [facility] Office Visit from 12/30/2020 in [facility] Office Visit from 12/02/2020 in [facility] Visit from 11/18/2020 in [facility]  PHQ-2 Total Score 1 3 0 0 0  PHQ-9 Total Score 2 7 -- -- --      [place] Office Visit from 01/17/2021 in [facility] No Risk        Assessment and Plan: Karen Cox is a 60 year old African-American female who lives in [place], has a history of GAD, schizoaffective disorder, borderline intellectual  functioning, morbid obesity was evaluated by telemedicine today.  Patient is currently stable with  regards to her mood.  Plan as noted below.  Plan Schizoaffective disorder-stable Wellbutrin 300 mg p.o. daily Paxil 30 mg p.o. daily Seroquel extended release 100 mg p.o. nightly  GAD-stable Paxil 30 mg p.o. daily Hydroxyzine 25 mg p.o. 3 times daily as needed for anxiety attacks Seroquel as prescribed  High risk medication use-pending labs-hemoglobin A1c, prolactin.  Will order hemoglobin A1c and prolactin again.  Will mail it to patient.  Follow-up in clinic in 3 months or sooner if needed.  This note was generated in part or whole with voice recognition software. Voice recognition is usually quite accurate but there are transcription errors that can and very often do occur. I apologize for any typographical errors that were not detected and corrected.        Ursula Alert, MD 04/19/2021, 1:58 PM

## 2021-04-21 ENCOUNTER — Other Ambulatory Visit: Payer: Self-pay

## 2021-04-21 ENCOUNTER — Ambulatory Visit (INDEPENDENT_AMBULATORY_CARE_PROVIDER_SITE_OTHER): Payer: Medicaid Other | Admitting: Nurse Practitioner

## 2021-04-21 ENCOUNTER — Encounter: Payer: Self-pay | Admitting: Nurse Practitioner

## 2021-04-21 VITALS — BP 144/76 | HR 90 | Temp 97.6°F | Ht 65.0 in | Wt 277.0 lb

## 2021-04-21 DIAGNOSIS — E785 Hyperlipidemia, unspecified: Secondary | ICD-10-CM

## 2021-04-21 DIAGNOSIS — I10 Essential (primary) hypertension: Secondary | ICD-10-CM

## 2021-04-21 DIAGNOSIS — F251 Schizoaffective disorder, depressive type: Secondary | ICD-10-CM | POA: Diagnosis not present

## 2021-04-21 NOTE — Progress Notes (Signed)
Acute Office Visit  Subjective:    Patient ID: Karen Cox, female    DOB: 1961/07/20, 60 y.o.   MRN: [id number]  Chief Complaint  Patient presents with   Follow-up    Needs referral to behavioral health.    HPI Patient is in today for lab and psych follow-up. She states she would like a referral for individual counseling.  She has HLD, and last labs were drawn on 03/18/20. She is fasting today.  Past Medical History:  Diagnosis Date   Allergy    Anxiety    Cervical high risk HPV (human papillomavirus) test positive 05/17/2014   Depression    Eczema    GERD (gastroesophageal reflux disease)    Hot flashes 09/01/2013   Hyperlipidemia    Mild mental slowing    Peri-menopause 08/05/2013   PMB (postmenopausal bleeding) 11/16/2013   Urticaria    Vaginal itching 08/05/2013   Yeast infection 08/05/2013    Past Surgical History:  Procedure Laterality Date   CERVICAL POLYPECTOMY N/A 01/06/2014   Procedure: CERVICAL POLYPECTOMY;  Surgeon: Florian Buff, MD;  Location: [facility];  Service: Gynecology;  Laterality: N/A;   COLONOSCOPY  09/19/2012   3 mm sessile polyp removed from the colon (simple adenoma).  Advised 10-year follow-up colonoscopy   DILITATION & CURRETTAGE/HYSTROSCOPY WITH THERMACHOICE ABLATION N/A 01/06/2014   Procedure: DILATATION & CURETTAGE/HYSTEROSCOPY WITH THERMACHOICE ABLATION;  Surgeon: Florian Buff, MD;  Location: [facility];  Service: Gynecology;  Laterality: N/A;  total therapy time:  9 minutes 12 seconds ,     temperature:87 degrees    Family History  Problem Relation Age of Onset   Diabetes Father    Heart disease Father    Hypertension Father    Hyperlipidemia Father    Diabetes Maternal Uncle    Anemia Maternal Uncle    Arthritis Maternal Uncle    Anxiety disorder Maternal Uncle    Arthritis Maternal Grandmother    Anxiety disorder Maternal Grandmother    Heart disease Maternal Grandfather        pacemaker   Anxiety disorder Daughter    Colon  cancer Neg Hx    Cancer Neg Hx    Allergic rhinitis Neg Hx    Angioedema Neg Hx    Asthma Neg Hx    Eczema Neg Hx    Immunodeficiency Neg Hx    Urticaria Neg Hx    Atopy Neg Hx     Social History   Socioeconomic History   Marital status: Single    Spouse name: Not on file   Number of children: 1   Years of education: Not on file   Highest education level: High school graduate  Occupational History   Not on file  Tobacco Use   Smoking status: Former    Packs/day: 0.25    Years: 3.00    Pack years: 0.75    Types: Cigarettes    Quit date: 09/03/1981    Years since quitting: 39.6   Smokeless tobacco: Never  Vaping Use   Vaping Use: Never used  Substance and Sexual Activity   Alcohol use: No   Drug use: No   Sexual activity: Never    Birth control/protection: Post-menopausal  Other Topics Concern   Not on file  Social History Narrative   Not on file   Social Determinants of Health   Financial Resource Strain: Not on file  Food Insecurity: Not on file  Transportation Needs: Not on file  Physical Activity:  Not on file  Stress: Not on file  Social Connections: Not on file  Intimate Partner Violence: Not on file    Outpatient Medications Prior to Visit  Medication Sig Dispense Refill   Blood Pressure Monitoring (BLOOD PRESSURE MONITOR AUTOMAT) DEVI 1 each by Does not apply route daily. 1 each 0   buPROPion (WELLBUTRIN XL) 300 MG 24 hr tablet Take 1 tablet (300 mg total) by mouth every morning. 90 tablet 0   ESTRING 2 MG vaginal ring SMARTSIG:Ring Vaginal     hydrOXYzine (VISTARIL) 25 MG capsule Take 1 capsule (25 mg total) by mouth 3 (three) times daily as needed. For severe anxiety attacks only 90 capsule 1   medroxyPROGESTERone (PROVERA) 5 MG tablet Take 5 mg by mouth daily.     Multiple Vitamins-Minerals (HAIR SKIN NAILS PO) Take by mouth daily.     pantoprazole (PROTONIX) 40 MG tablet Take 1 tablet (40 mg total) by mouth 2 (two) times daily before a meal. 60  tablet 5   PARoxetine (PAXIL) 20 MG tablet Take 1.5 tablets (30 mg total) by mouth daily. 135 tablet 0   propranolol ER (INDERAL LA) 60 MG 24 hr capsule Take 1 capsule (60 mg total) by mouth daily. 180 capsule 1   QUEtiapine (SEROQUEL XR) 50 MG TB24 24 hr tablet TAKE (2) TABLETS BY MOUTH AT BEDTIME. 180 tablet 0   Facility-Administered Medications Prior to Visit  Medication Dose Route Frequency Provider Last Rate Last Admin   omalizumab Arvid Right) injection 300 mg  300 mg Subcutaneous Q28 days Valentina Shaggy, MD   300 mg at 12/31/17 1035    Allergies  Allergen Reactions   Benadryl  [Diphenhydramine Hcl (Sleep)]    Benadryl [Diphenhydramine Hcl] Other (See Comments)    Makes congestion worst    Dextromethorphan Hbr Hives   Robitussin (Alcohol Free) [Guaifenesin]     Makes cough worse   Aspirin Rash and Hives    rash   Diphenhydramine Hcl Hives and Rash    Makes congestion worst    Latex Rash    Review of Systems  Constitutional: Negative.   Respiratory: Negative.    Cardiovascular: Negative.   Psychiatric/Behavioral: Negative.         Has hx of schizoaffective disorder, and would like individual counseling/therapy      Objective:    Physical Exam Constitutional:      Appearance: Normal appearance. She is obese.  Cardiovascular:     Rate and Rhythm: Normal rate and regular rhythm.     Pulses: Normal pulses.     Heart sounds: Normal heart sounds.  Pulmonary:     Effort: Pulmonary effort is normal.     Breath sounds: Normal breath sounds.  Neurological:     Mental Status: She is alert.  Psychiatric:        Mood and Affect: Mood normal.     Comments: Had difficulty with a direct answer to questions today. She saw Elmyra Ricks for therapy previously, but couldn't give clear answer why she wanted a different therapist. She said she wants a female psych provider, but Elmyra Ricks is female.    BP (!) 144/76 (BP Location: Left Arm, Patient Position: Sitting, Cuff Size: Large)    Pulse 90   Temp 97.6 F (36.4 C) (Temporal)   Ht _0  (1.651 m)   Wt 277 lb (125.6 kg)   LMP 06/27/2013   SpO2 95%   BMI 46.10 kg/m  Wt Readings from Last 3 Encounters:  04/21/21 277  lb (125.6 kg)  01/17/21 279 lb 3.2 oz (126.6 kg)  12/30/20 277 lb (125.6 kg)    Health Maintenance Due  Topic Date Due   Pneumococcal Vaccine 58-71 Years old (1 - PCV) Never done   Zoster Vaccines- Shingrix (1 of 2) Never done    There are no preventive care reminders to display for this patient.   Lab Results  Component Value Date   TSH 1.83 03/18/2020   Lab Results  Component Value Date   WBC 6.6 03/18/2020   HGB 12.7 03/18/2020   HCT 38.3 03/18/2020   MCV 87.6 03/18/2020   PLT 253 03/18/2020   Lab Results  Component Value Date   NA 137 03/18/2020   K 4.7 03/18/2020   CO2 23 03/18/2020   GLUCOSE 89 03/18/2020   BUN 13 03/18/2020   CREATININE 0.94 03/18/2020   BILITOT 0.7 03/18/2020   ALKPHOS 101 06/05/2017   AST 18 03/18/2020   ALT 19 03/18/2020   PROT 7.3 03/18/2020   ALBUMIN 4.3 06/05/2017   CALCIUM 9.6 03/18/2020   ANIONGAP 10 08/27/2019   Lab Results  Component Value Date   CHOL 221 (H) 03/18/2020   Lab Results  Component Value Date   HDL 59 03/18/2020   Lab Results  Component Value Date   LDLCALC 134 (H) 03/18/2020   Lab Results  Component Value Date   TRIG 149 03/18/2020   Lab Results  Component Value Date   CHOLHDL 3.7 03/18/2020   Lab Results  Component Value Date   HGBA1C 4.9 08/18/2013       Assessment & Plan:   Problem List Items Addressed This Visit       Cardiovascular and Mediastinum   Essential hypertension - Primary   Relevant Orders   CBC with Differential/Platelet   CMP14+EGFR   Lipid Panel With LDL/HDL Ratio     Other   Hyperlipidemia LDL goal <100   Relevant Orders   Lipid Panel With LDL/HDL Ratio   Schizoaffective disorder, depressive type ([place])   Relevant Orders   Ambulatory referral to Psychology     No orders of  the defined types were placed in this encounter.    Noreene Larsson, NP

## 2021-04-22 LAB — CBC WITH DIFFERENTIAL/PLATELET
Basophils Absolute: 0 10*3/uL (ref 0.0–0.2)
Basos: 0 %
EOS (ABSOLUTE): 0.1 10*3/uL (ref 0.0–0.4)
Eos: 1 %
Hematocrit: 40.7 % (ref 34.0–46.6)
Hemoglobin: 12.8 g/dL (ref 11.1–15.9)
Immature Grans (Abs): 0 10*3/uL (ref 0.0–0.1)
Immature Granulocytes: 0 %
Lymphocytes Absolute: 2 10*3/uL (ref 0.7–3.1)
Lymphs: 23 %
MCH: 26.2 pg — ABNORMAL LOW (ref 26.6–33.0)
MCHC: 31.4 g/dL — ABNORMAL LOW (ref 31.5–35.7)
MCV: 83 fL (ref 79–97)
Monocytes Absolute: 0.4 10*3/uL (ref 0.1–0.9)
Monocytes: 4 %
Neutrophils Absolute: 6.5 10*3/uL (ref 1.4–7.0)
Neutrophils: 72 %
Platelets: 332 10*3/uL (ref 150–450)
RBC: 4.88 x10E6/uL (ref 3.77–5.28)
RDW: 13.8 % (ref 11.7–15.4)
WBC: 9 10*3/uL (ref 3.4–10.8)

## 2021-04-22 LAB — CMP14+EGFR
ALT: 17 IU/L (ref 0–32)
AST: 18 IU/L (ref 0–40)
Albumin/Globulin Ratio: 1.5 (ref 1.2–2.2)
Albumin: 4.6 g/dL (ref 3.8–4.9)
Alkaline Phosphatase: 115 IU/L (ref 44–121)
BUN/Creatinine Ratio: 15 (ref 9–23)
BUN: 13 mg/dL (ref 6–24)
Bilirubin Total: 0.5 mg/dL (ref 0.0–1.2)
CO2: 21 mmol/L (ref 20–29)
Calcium: 9.6 mg/dL (ref 8.7–10.2)
Chloride: 105 mmol/L (ref 96–106)
Creatinine, Ser: 0.89 mg/dL (ref 0.57–1.00)
Globulin, Total: 3 g/dL (ref 1.5–4.5)
Glucose: 94 mg/dL (ref 65–99)
Potassium: 4.9 mmol/L (ref 3.5–5.2)
Sodium: 143 mmol/L (ref 134–144)
Total Protein: 7.6 g/dL (ref 6.0–8.5)
eGFR: 75 mL/min/{1.73_m2} (ref >59)

## 2021-04-22 LAB — LIPID PANEL WITH LDL/HDL RATIO
Cholesterol, Total: 203 mg/dL — ABNORMAL HIGH (ref 100–199)
HDL: 35 mg/dL — ABNORMAL LOW (ref >39)
LDL Chol Calc (NIH): 130 mg/dL — ABNORMAL HIGH (ref 0–99)
LDL/HDL Ratio: 3.7 ratio — ABNORMAL HIGH (ref 0.0–3.2)
Triglycerides: 211 mg/dL — ABNORMAL HIGH (ref 0–149)
VLDL Cholesterol Cal: 38 mg/dL (ref 5–40)

## 2021-04-24 NOTE — Progress Notes (Signed)
Pls advise normal blood count, kidney and liver function  Cholesterol is high and good cholesterol low Needs to commit to regular exercise and reduce fried and fatty foods.

## 2021-05-03 ENCOUNTER — Telehealth (INDEPENDENT_AMBULATORY_CARE_PROVIDER_SITE_OTHER): Payer: Medicaid Other | Admitting: Licensed Clinical Social Worker

## 2021-05-03 ENCOUNTER — Other Ambulatory Visit: Payer: Self-pay

## 2021-05-03 DIAGNOSIS — F251 Schizoaffective disorder, depressive type: Secondary | ICD-10-CM

## 2021-05-03 NOTE — Progress Notes (Signed)
Patient states that she only wants the telephone number to the [facility] office. Writer provided with that information [phone] also informed her that she can receive therapy with [facility], she declines

## 2021-05-17 ENCOUNTER — Ambulatory Visit (HOSPITAL_COMMUNITY): Payer: Self-pay | Admitting: Clinical

## 2021-05-18 ENCOUNTER — Other Ambulatory Visit: Payer: Self-pay | Admitting: Nurse Practitioner

## 2021-05-29 ENCOUNTER — Other Ambulatory Visit: Payer: Self-pay | Admitting: Psychiatry

## 2021-05-29 DIAGNOSIS — F251 Schizoaffective disorder, depressive type: Secondary | ICD-10-CM

## 2021-06-09 ENCOUNTER — Other Ambulatory Visit: Payer: Self-pay | Admitting: Family Medicine

## 2021-06-09 DIAGNOSIS — E559 Vitamin D deficiency, unspecified: Secondary | ICD-10-CM

## 2021-07-25 ENCOUNTER — Other Ambulatory Visit: Payer: Self-pay

## 2021-07-25 ENCOUNTER — Encounter: Payer: Self-pay | Admitting: Psychiatry

## 2021-07-25 ENCOUNTER — Ambulatory Visit (INDEPENDENT_AMBULATORY_CARE_PROVIDER_SITE_OTHER): Payer: Medicaid Other | Admitting: Psychiatry

## 2021-07-25 ENCOUNTER — Other Ambulatory Visit
Admission: RE | Admit: 2021-07-25 | Discharge: 2021-07-25 | Disposition: A | Discharge status: Home / Self Care | Payer: Medicaid Other | Attending: Psychiatry | Admitting: Psychiatry

## 2021-07-25 VITALS — BP 147/81 | HR 111 | Temp 97.9°F | Wt 280.4 lb

## 2021-07-25 DIAGNOSIS — F251 Schizoaffective disorder, depressive type: Secondary | ICD-10-CM | POA: Diagnosis not present

## 2021-07-25 DIAGNOSIS — Z79899 Other long term (current) drug therapy: Secondary | ICD-10-CM

## 2021-07-25 DIAGNOSIS — F411 Generalized anxiety disorder: Secondary | ICD-10-CM | POA: Insufficient documentation

## 2021-07-25 DIAGNOSIS — R4183 Borderline intellectual functioning: Secondary | ICD-10-CM

## 2021-07-25 DIAGNOSIS — R030 Elevated blood-pressure reading, without diagnosis of hypertension: Secondary | ICD-10-CM

## 2021-07-25 LAB — TSH: TSH: 2.346 u[IU]/mL (ref 0.350–4.500)

## 2021-07-25 MED ORDER — PAROXETINE HCL 20 MG PO TABS: 30.0000 mg | ORAL_TABLET | Freq: Every day | ORAL | 0 refills | Status: DC

## 2021-07-25 MED ORDER — BUPROPION HCL ER (XL) 300 MG PO TB24: 300.0000 mg | ORAL_TABLET | ORAL | 0 refills | Status: DC

## 2021-07-25 MED ORDER — QUETIAPINE FUMARATE ER 50 MG PO TB24: ORAL_TABLET | ORAL | 0 refills | Status: DC

## 2021-07-25 NOTE — Progress Notes (Deleted)
Referring Provider: Fayrene Helper, MD Primary Care Physician:  Fayrene Helper, MD Primary GI Physician: Dr. Abbey Chatters  No chief complaint on file.   HPI:   Karen Cox is a 60 y.o. female presenting today with a history of GERD and dysphagia.  Last seen in our office 12/26/2020.  Reported burning in the back of her throat typically occurring in the evening after she lays down.  She has been on multiple medications without relief, some of this with allergy medication, also given Pepcid which worked well initially, but lost effect.  Noted 70 pound weight gain over the last year with poor dietary choices.  Also admitted to laying down right after eating or sometimes laying down while eating.  Denies abdominal pain.  Reported occasional sensation of food sticking in the upper chest requiring regurgitation.  No significant lower GI symptoms or alarm symptoms.  Plan to start Protonix 40 mg twice daily, discussed antireflux measures, discussed need for weight loss.  Had planned to follow-up in 6 weeks with Dr. Abbey Chatters and consider EGD if not significantly improved.  Patient no-show to her follow-up in April and canceled her follow-up in June.  Today:   Past Medical History:  Diagnosis Date   Allergy    Anxiety    Cervical high risk HPV (human papillomavirus) test positive 05/17/2014   Depression    Eczema    GERD (gastroesophageal reflux disease)    Hot flashes 09/01/2013   Hyperlipidemia    Mild mental slowing    Peri-menopause 08/05/2013   PMB (postmenopausal bleeding) 11/16/2013   Urticaria    Vaginal itching 08/05/2013   Yeast infection 08/05/2013    Past Surgical History:  Procedure Laterality Date   CERVICAL POLYPECTOMY N/A 01/06/2014   Procedure: CERVICAL POLYPECTOMY;  Surgeon: Florian Buff, MD;  Location: [facility];  Service: Gynecology;  Laterality: N/A;   COLONOSCOPY  09/19/2012   3 mm sessile polyp removed from the colon (simple adenoma).  Advised 10-year  follow-up colonoscopy   DILITATION & CURRETTAGE/HYSTROSCOPY WITH THERMACHOICE ABLATION N/A 01/06/2014   Procedure: DILATATION & CURETTAGE/HYSTEROSCOPY WITH THERMACHOICE ABLATION;  Surgeon: Florian Buff, MD;  Location: [facility];  Service: Gynecology;  Laterality: N/A;  total therapy time:  9 minutes 12 seconds ,     temperature:87 degrees    Current Outpatient Medications  Medication Sig Dispense Refill   amoxicillin (AMOXIL) 500 MG capsule Take 500 mg by mouth 3 (three) times daily. (Patient not taking: Reported on 07/25/2021)     Blood Pressure Monitoring (BLOOD PRESSURE MONITOR AUTOMAT) DEVI 1 each by Does not apply route daily. 1 each 0   buPROPion (WELLBUTRIN XL) 300 MG 24 hr tablet Take 1 tablet (300 mg total) by mouth every morning. 90 tablet 0   ESTRING 2 MG vaginal ring SMARTSIG:Ring Vaginal (Patient not taking: Reported on 07/25/2021)     hydrOXYzine (VISTARIL) 25 MG capsule Take 1 capsule (25 mg total) by mouth 3 (three) times daily as needed. For severe anxiety attacks only (Patient not taking: Reported on 07/25/2021) 90 capsule 1   medroxyPROGESTERone (PROVERA) 5 MG tablet Take 5 mg by mouth daily.     Multiple Vitamins-Minerals (HAIR SKIN NAILS PO) Take by mouth daily. (Patient not taking: Reported on 07/25/2021)     pantoprazole (PROTONIX) 40 MG tablet Take 1 tablet (40 mg total) by mouth 2 (two) times daily before a meal. 60 tablet 5   PARoxetine (PAXIL) 20 MG tablet Take 1.5 tablets (30 mg  total) by mouth daily. 135 tablet 0   propranolol ER (INDERAL LA) 60 MG 24 hr capsule Take 1 capsule (60 mg total) by mouth daily. 180 capsule 1   QUEtiapine (SEROQUEL XR) 50 MG TB24 24 hr tablet TAKE (2) TABLETS BY MOUTH AT BEDTIME. 180 tablet 0   Vitamin D, Ergocalciferol, (DRISDOL) 1.25 MG (50000 UNIT) CAPS capsule Take 50,000 Units by mouth once a week. (Patient not taking: Reported on 07/25/2021)     Current Facility-Administered Medications  Medication Dose Route Frequency Provider Last Rate  Last Admin   omalizumab Arvid Right) injection 300 mg  300 mg Subcutaneous Q28 days Valentina Shaggy, MD   300 mg at 12/31/17 1035    Allergies as of 07/27/2021 - Review Complete 07/25/2021  Allergen Reaction Noted   Benadryl  [diphenhydramine hcl (sleep)]     Benadryl [diphenhydramine hcl] Other (See Comments) 12/31/2010   Dextromethorphan hbr Hives 05/29/2016   Robitussin (alcohol free) [guaifenesin]  05/17/2014   Aspirin Rash and Hives 05/08/2011   Diphenhydramine hcl Hives and Rash 03/01/2016   Latex Rash 09/04/2011    Family History  Problem Relation Age of Onset   Diabetes Father    Heart disease Father    Hypertension Father    Hyperlipidemia Father    Diabetes Maternal Uncle    Anemia Maternal Uncle    Arthritis Maternal Uncle    Anxiety disorder Maternal Uncle    Arthritis Maternal Grandmother    Anxiety disorder Maternal Grandmother    Heart disease Maternal Grandfather        pacemaker   Anxiety disorder Daughter    Colon cancer Neg Hx    Cancer Neg Hx    Allergic rhinitis Neg Hx    Angioedema Neg Hx    Asthma Neg Hx    Eczema Neg Hx    Immunodeficiency Neg Hx    Urticaria Neg Hx    Atopy Neg Hx     Social History   Socioeconomic History   Marital status: Single    Spouse name: Not on file   Number of children: 1   Years of education: Not on file   Highest education level: High school graduate  Occupational History   Not on file  Tobacco Use   Smoking status: Former    Packs/day: 0.25    Years: 3.00    Pack years: 0.75    Types: Cigarettes    Quit date: 09/03/1981    Years since quitting: 39.9   Smokeless tobacco: Never  Vaping Use   Vaping Use: Never used  Substance and Sexual Activity   Alcohol use: No   Drug use: No   Sexual activity: Never    Birth control/protection: Post-menopausal  Other Topics Concern   Not on file  Social History Narrative   Not on file   Social Determinants of Health   Financial Resource Strain: Not on  file  Food Insecurity: Not on file  Transportation Needs: Not on file  Physical Activity: Not on file  Stress: Not on file  Social Connections: Not on file    Review of Systems: Gen: Denies fever, chills, anorexia. Denies fatigue, weakness, weight loss.  CV: Denies chest pain, palpitations, syncope, peripheral edema, and claudication. Resp: Denies dyspnea at rest, cough, wheezing, coughing up blood, and pleurisy. GI: Denies vomiting blood, jaundice, and fecal incontinence.   Denies dysphagia or odynophagia. Derm: Denies rash, itching, dry skin Psych: Denies depression, anxiety, memory loss, confusion. No homicidal or suicidal ideation.  Heme: Denies bruising, bleeding, and enlarged lymph nodes.  Physical Exam: LMP 06/27/2013  General:   Alert and oriented. No distress noted. Pleasant and cooperative.  Head:  Normocephalic and atraumatic. Eyes:  Conjuctiva clear without scleral icterus. Mouth:  Oral mucosa pink and moist. Good dentition. No lesions. Heart:  S1, S2 present without murmurs appreciated. Lungs:  Clear to auscultation bilaterally. No wheezes, rales, or rhonchi. No distress.  Abdomen:  +BS, soft, non-tender and non-distended. No rebound or guarding. No HSM or masses noted. Msk:  Symmetrical without gross deformities. Normal posture. Extremities:  Without edema. Neurologic:  Alert and  oriented x4 Psych:  Alert and cooperative. Normal mood and affect.

## 2021-07-25 NOTE — Progress Notes (Signed)
[facility] OP Progress Note  07/25/2021 12:33 PM JAELLE CAMPANILE  MRN:  [id number]  Chief Complaint:  Chief Complaint   Follow-up; Depression    HPI: Karen Cox is a 60 year old African-American female, lives in [place], has a history of GAD's, schizoaffective disorder, borderline intellectual functioning, morbid obesity, seasonal allergies, insomnia was evaluated in office today.  Patient today reports overall her mood symptoms are stable.  She denies any significant anxiety or mood swings.  She does report auditory hallucinations-name-calling which may have happened at least once since her last visit with writer in July 2022.  She however reports she has been coping okay and it does not bother her much.  Patient reports appetite is fair.  Reports sleep is good.  She is tolerating her medications well and is compliant on them.  She has been noncompliant with labs, agrees to get it done.  Patient denies any suicidality, homicidality .  Patient has upcoming appointment with psychotherapist and agrees to keep it.  Patient denies any other concerns today.  Visit Diagnosis:    ICD-10-CM   1. Schizoaffective disorder, depressive type ([place])  F25.1 QUEtiapine (SEROQUEL XR) 50 MG TB24 24 hr tablet    PARoxetine (PAXIL) 20 MG tablet    2. GAD (generalized anxiety disorder)  F41.1 buPROPion (WELLBUTRIN XL) 300 MG 24 hr tablet    3. Borderline intellectual functioning  R41.83     4. High risk medication use  Z79.899     5. Elevated blood pressure reading  R03.0       Past Psychiatric History: Reviewed past psychiatric history from progress note on 12/09/2019.  Past trials of BuSpar, hydroxyzine, melatonin, Paxil, Wellbutrin  Past Medical History:  Past Medical History:  Diagnosis Date   Allergy    Anxiety    Cervical high risk HPV (human papillomavirus) test positive 05/17/2014   Depression    Eczema    GERD (gastroesophageal reflux disease)    Hot flashes 09/01/2013    Hyperlipidemia    Mild mental slowing    Peri-menopause 08/05/2013   PMB (postmenopausal bleeding) 11/16/2013   Urticaria    Vaginal itching 08/05/2013   Yeast infection 08/05/2013    Past Surgical History:  Procedure Laterality Date   CERVICAL POLYPECTOMY N/A 01/06/2014   Procedure: CERVICAL POLYPECTOMY;  Surgeon: Florian Buff, MD;  Location: [facility];  Service: Gynecology;  Laterality: N/A;   COLONOSCOPY  09/19/2012   3 mm sessile polyp removed from the colon (simple adenoma).  Advised 10-year follow-up colonoscopy   DILITATION & CURRETTAGE/HYSTROSCOPY WITH THERMACHOICE ABLATION N/A 01/06/2014   Procedure: DILATATION & CURETTAGE/HYSTEROSCOPY WITH THERMACHOICE ABLATION;  Surgeon: Florian Buff, MD;  Location: [facility];  Service: Gynecology;  Laterality: N/A;  total therapy time:  9 minutes 12 seconds ,     temperature:87 degrees    Family Psychiatric History: Reviewed family psychiatric history from progress note on 12/09/2019  Family History:  Family History  Problem Relation Age of Onset   Diabetes Father    Heart disease Father    Hypertension Father    Hyperlipidemia Father    Diabetes Maternal Uncle    Anemia Maternal Uncle    Arthritis Maternal Uncle    Anxiety disorder Maternal Uncle    Arthritis Maternal Grandmother    Anxiety disorder Maternal Grandmother    Heart disease Maternal Grandfather        pacemaker   Anxiety disorder Daughter    Colon cancer Neg Hx    Cancer  Neg Hx    Allergic rhinitis Neg Hx    Angioedema Neg Hx    Asthma Neg Hx    Eczema Neg Hx    Immunodeficiency Neg Hx    Urticaria Neg Hx    Atopy Neg Hx     Social History: Reviewed social history from progress note on 12/09/2019 Social History   Socioeconomic History   Marital status: Single    Spouse name: Not on file   Number of children: 1   Years of education: Not on file   Highest education level: High school graduate  Occupational History   Not on file  Tobacco Use   Smoking status:  Former    Packs/day: 0.25    Years: 3.00    Pack years: 0.75    Types: Cigarettes    Quit date: 09/03/1981    Years since quitting: 39.9   Smokeless tobacco: Never  Vaping Use   Vaping Use: Never used  Substance and Sexual Activity   Alcohol use: No   Drug use: No   Sexual activity: Never    Birth control/protection: Post-menopausal  Other Topics Concern   Not on file  Social History Narrative   Not on file   Social Determinants of Health   Financial Resource Strain: Not on file  Food Insecurity: Not on file  Transportation Needs: Not on file  Physical Activity: Not on file  Stress: Not on file  Social Connections: Not on file    Allergies:  Allergies  Allergen Reactions   Benadryl  [Diphenhydramine Hcl (Sleep)]    Benadryl [Diphenhydramine Hcl] Other (See Comments)    Makes congestion worst    Dextromethorphan Hbr Hives   Robitussin (Alcohol Free) [Guaifenesin]     Makes cough worse   Aspirin Rash and Hives    rash   Diphenhydramine Hcl Hives and Rash    Makes congestion worst    Latex Rash    Metabolic Disorder Labs: Lab Results  Component Value Date   HGBA1C 5.6 07/25/2021   MPG 114 07/25/2021   MPG 94 08/18/2013   Lab Results  Component Value Date   PROLACTIN 18.6 07/25/2021   Lab Results  Component Value Date   CHOL 203 (H) 04/21/2021   TRIG 211 (H) 04/21/2021   HDL 35 (L) 04/21/2021   CHOLHDL 3.7 03/18/2020   VLDL 20 08/17/2016   LDLCALC 130 (H) 04/21/2021   LDLCALC 134 (H) 03/18/2020   Lab Results  Component Value Date   TSH 2.346 07/25/2021   TSH 1.83 03/18/2020    Therapeutic Level Labs: No results found for: LITHIUM No results found for: VALPROATE No components found for:  CBMZ  Current Medications: Current Outpatient Medications  Medication Sig Dispense Refill   Blood Pressure Monitoring (BLOOD PRESSURE MONITOR AUTOMAT) DEVI 1 each by Does not apply route daily. 1 each 0   medroxyPROGESTERone (PROVERA) 5 MG tablet Take 5 mg  by mouth daily.     pantoprazole (PROTONIX) 40 MG tablet Take 1 tablet (40 mg total) by mouth 2 (two) times daily before a meal. 60 tablet 5   propranolol ER (INDERAL LA) 60 MG 24 hr capsule Take 1 capsule (60 mg total) by mouth daily. 180 capsule 1   amoxicillin (AMOXIL) 500 MG capsule Take 500 mg by mouth 3 (three) times daily. (Patient not taking: Reported on 07/25/2021)     buPROPion (WELLBUTRIN XL) 300 MG 24 hr tablet Take 1 tablet (300 mg total) by mouth every morning. 90 tablet  0   ESTRING 2 MG vaginal ring SMARTSIG:Ring Vaginal (Patient not taking: Reported on 07/25/2021)     hydrOXYzine (VISTARIL) 25 MG capsule Take 1 capsule (25 mg total) by mouth 3 (three) times daily as needed. For severe anxiety attacks only (Patient not taking: Reported on 07/25/2021) 90 capsule 1   Multiple Vitamins-Minerals (HAIR SKIN NAILS PO) Take by mouth daily. (Patient not taking: Reported on 07/25/2021)     PARoxetine (PAXIL) 20 MG tablet Take 1.5 tablets (30 mg total) by mouth daily. 135 tablet 0   QUEtiapine (SEROQUEL XR) 50 MG TB24 24 hr tablet TAKE (2) TABLETS BY MOUTH AT BEDTIME. 180 tablet 0   Vitamin D, Ergocalciferol, (DRISDOL) 1.25 MG (50000 UNIT) CAPS capsule Take 50,000 Units by mouth once a week. (Patient not taking: Reported on 07/25/2021)     Current Facility-Administered Medications  Medication Dose Route Frequency Provider Last Rate Last Admin   omalizumab Arvid Right) injection 300 mg  300 mg Subcutaneous Q28 days Valentina Shaggy, MD   300 mg at 12/31/17 1035     Musculoskeletal: Strength & Muscle Tone: within normal limits Gait & Station: normal Patient leans: N/A  Psychiatric Specialty Exam: Review of Systems  Psychiatric/Behavioral:  Positive for hallucinations.   All other systems reviewed and are negative.  Blood pressure (!) 147/81, pulse (!) 111, temperature 97.9 F (36.6 C), temperature source Temporal, weight 280 lb 6.4 oz (127.2 kg), last menstrual period 06/27/2013.Body  mass index is 46.66 kg/m.  General Appearance: Casual  Eye Contact:  Fair  Speech:  Normal Rate  Volume:  Normal  Mood:  Euthymic  Affect:  Congruent  Thought Process:  Goal Directed and Descriptions of Associations: Intact  Orientation:  Full (Time, Place, and Person)  Thought Content: Hallucinations: Auditory  chronic - able to cope  Suicidal Thoughts:  No  Homicidal Thoughts:  No  Memory:  Immediate;   Fair Recent;   Fair Remote;   Fair  Judgement:  Fair  Insight:  Fair  Psychomotor Activity:  Normal  Concentration:  Concentration: Fair and Attention Span: Fair  Recall:  [employer] of Knowledge: Fair  Language: Fair  Akathisia:  No  Handed:  Right  AIMS (if indicated): done  Assets:  Communication Skills Desire for Improvement Housing Social Support  ADL's:  Intact  Cognition: WNL  Sleep:  Fair   Screenings: GAD-7    [place] Office Visit from 10/13/2019 in [facility] Office Visit from 09/08/2019 in [facility]  Total GAD-7 Score 7 15      PHQ2-9    [place] Visit from 07/25/2021 in [facility] Visit from 04/21/2021 in [facility] Video Visit from 04/18/2021 in [facility] Office Visit from 01/17/2021 in [facility] Visit from 12/30/2020 in [facility]  PHQ-2 Total Score 1 1 1 3  0  PHQ-9 Total Score -- -- 2 7 --      [place] Office Visit from 01/17/2021 in [facility] No Risk        Assessment and Plan: LEITA LINDBLOOM is a 60 year old African-American female who lives in [place], has a history of GAD, schizoaffective disorder, borderline intellectual functioning, morbid obesity was evaluated in office today.  Patient is currently stable.  Plan as noted below.  Plan Schizoaffective disorder-stable Wellbutrin 300 mg p.o. daily Paxil 30  mg p.o. daily Seroquel extended release 100 mg p.o. nightly  AIMS - 0  GAD-stable Paxil 30 mg p.o. daily Hydroxyzine 25 mg p.o. 3 times daily as needed for severe anxiety attacks Seroquel as prescribed  High risk medication use-patient has been noncompliant with labs.  Patient provided education.  Will order hemoglobin A1c, prolactin, TSH.  She agrees to get it done today.  We will provide lab slips.  Patient agrees to go [facility] lab.  Elevated blood pressure reading-patient with elevated blood pressure reading and tachycardia in session today, patient to follow up with primary care provider.  Patient advised to follow up with psychotherapist with [facility], has upcoming appointment.  Follow-up in clinic in 2 months or sooner if needed in person.  This note was generated in part or whole with voice recognition software. Voice recognition is usually quite accurate but there are transcription errors that can and very often do occur. I apologize for any typographical errors that were not detected and corrected.       Ursula Alert, MD 07/26/2021, 2:13 PM

## 2021-07-26 LAB — PROLACTIN: Prolactin: 18.6 ng/mL (ref 4.8–23.3)

## 2021-07-26 LAB — HEMOGLOBIN A1C
Hgb A1c MFr Bld: 5.6 % (ref 4.8–5.6)
Mean Plasma Glucose: 114 mg/dL

## 2021-07-27 ENCOUNTER — Ambulatory Visit: Payer: Medicaid Other | Admitting: Gastroenterology

## 2021-07-31 ENCOUNTER — Telehealth (HOSPITAL_COMMUNITY): Payer: Self-pay | Admitting: Professional

## 2021-08-01 ENCOUNTER — Telehealth (HOSPITAL_COMMUNITY): Payer: Self-pay | Admitting: Psychiatry

## 2021-08-07 ENCOUNTER — Other Ambulatory Visit: Payer: Self-pay | Admitting: Family Medicine

## 2021-08-10 ENCOUNTER — Other Ambulatory Visit: Payer: Self-pay

## 2021-08-10 ENCOUNTER — Ambulatory Visit (INDEPENDENT_AMBULATORY_CARE_PROVIDER_SITE_OTHER): Payer: Medicaid Other | Admitting: Clinical

## 2021-08-10 DIAGNOSIS — F251 Schizoaffective disorder, depressive type: Secondary | ICD-10-CM | POA: Diagnosis not present

## 2021-08-10 DIAGNOSIS — F411 Generalized anxiety disorder: Secondary | ICD-10-CM | POA: Diagnosis not present

## 2021-08-10 DIAGNOSIS — F79 Unspecified intellectual disabilities: Secondary | ICD-10-CM
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
## 2021-08-24 ENCOUNTER — Ambulatory Visit (INDEPENDENT_AMBULATORY_CARE_PROVIDER_SITE_OTHER): Payer: Medicaid Other | Admitting: Clinical

## 2021-08-24 ENCOUNTER — Other Ambulatory Visit: Payer: Self-pay

## 2021-08-24 DIAGNOSIS — F79 Unspecified intellectual disabilities: Secondary | ICD-10-CM | POA: Diagnosis not present

## 2021-08-24 DIAGNOSIS — F411 Generalized anxiety disorder: Secondary | ICD-10-CM | POA: Diagnosis not present

## 2021-08-24 DIAGNOSIS — F251 Schizoaffective disorder, depressive type: Secondary | ICD-10-CM

## 2021-08-24 NOTE — Progress Notes (Signed)
   THERAPIST PROGRESS NOTE  Session Time: 10am  Participation Level: Active  Behavioral Response:  unknown appearanceAlertpleasant  Type of Therapy: Individual Therapy  Treatment Goals addressed: Anxiety and Coping  Interventions: Supportive Virtual Visit via Telephone Note  I connected with Karen Cox on 08/24/21 at 10:00 AM EST by telephone and verified that I am speaking with the correct person using two identifiers.  Location: Patient: home Provider: office   I discussed the limitations, risks, security and privacy concerns of performing an evaluation and management service by telephone and the availability of in person appointments. I also discussed with the patient that there may be a patient responsible charge related to this service. The patient expressed understanding and agreed to proceed.   I discussed the assessment and treatment plan with the patient. The patient was provided an opportunity to ask questions and all were answered. The patient agreed with the plan and demonstrated an understanding of the instructions.   The patient was advised to call back or seek an in-person evaluation if the symptoms worsen or if the condition fails to improve as anticipated.  I provided 39 minutes of non-face-to-face time during this encounter.   Summary: Pt presents in a pleasant mood. Pt reports a history of anxiety, especially being in public places. Pt says she becomes "nervous" when she goes to the grocery store. Pt endorses the following sxs when anxious: increased heart rate, muscle tension, upset stomach and uncontrollable worry. Pt states she has always lived with her mother but a goal of hers is to live alone and own/drive a vehicle. Pt states she is on a waiting list for housing but expressed fear about independent living.  Suicidal/Homicidal: Pt denies SI/HI, no AH/VH reported. Pt reports history of AH/VH and psychiatric hospitalizations. Pt encouraged to call 911 in  the event of an emergency.  Therapist Response: CSW assessed for changes in mood, behavior and daily functioning. CSW processed with pt sxs of anxiety and how it grows. CSW discussed with deep breathing as a relaxation technique to manage anxiety. CSW practiced with pt deep breathing.  Plan: Return again in 2 weeks.  Diagnosis: Axis I: Generalized anxiety disorder    Schizoaffective disorder, depressive type    Unspecified intellectual disability    Axis II: No diagnosis    Yvette Rack, LCSW 08/24/2021

## 2021-09-04 ENCOUNTER — Other Ambulatory Visit (HOSPITAL_COMMUNITY): Payer: Self-pay | Admitting: Family Medicine

## 2021-09-04 DIAGNOSIS — Z1231 Encounter for screening mammogram for malignant neoplasm of breast: Secondary | ICD-10-CM

## 2021-09-07 ENCOUNTER — Other Ambulatory Visit: Payer: Self-pay

## 2021-09-07 ENCOUNTER — Ambulatory Visit (HOSPITAL_COMMUNITY): Payer: Medicaid Other | Admitting: Clinical

## 2021-09-15 ENCOUNTER — Other Ambulatory Visit: Payer: Self-pay | Admitting: Gastroenterology

## 2021-09-19 ENCOUNTER — Ambulatory Visit: Payer: Medicaid Other | Admitting: Psychiatry

## 2021-09-20 ENCOUNTER — Ambulatory Visit (HOSPITAL_COMMUNITY): Payer: Medicaid Other

## 2021-10-10 ENCOUNTER — Ambulatory Visit: Payer: Medicaid Other | Admitting: Family Medicine

## 2021-10-16 ENCOUNTER — Ambulatory Visit (HOSPITAL_COMMUNITY): Payer: Medicaid Other

## 2021-10-18 ENCOUNTER — Other Ambulatory Visit: Payer: Self-pay

## 2021-10-18 ENCOUNTER — Ambulatory Visit (INDEPENDENT_AMBULATORY_CARE_PROVIDER_SITE_OTHER): Payer: Medicaid Other | Admitting: Psychiatry

## 2021-10-18 ENCOUNTER — Encounter: Payer: Self-pay | Admitting: Psychiatry

## 2021-10-18 VITALS — BP 140/88 | HR 87 | Temp 97.6°F | Wt 284.0 lb

## 2021-10-18 DIAGNOSIS — F411 Generalized anxiety disorder: Secondary | ICD-10-CM

## 2021-10-18 DIAGNOSIS — Z79899 Other long term (current) drug therapy: Secondary | ICD-10-CM | POA: Diagnosis not present

## 2021-10-18 DIAGNOSIS — F251 Schizoaffective disorder, depressive type: Secondary | ICD-10-CM

## 2021-10-18 DIAGNOSIS — R4183 Borderline intellectual functioning: Secondary | ICD-10-CM

## 2021-10-18 MED ORDER — BUPROPION HCL ER (XL) 300 MG PO TB24: 300.0000 mg | ORAL_TABLET | ORAL | 1 refills | Status: DC

## 2021-10-18 MED ORDER — QUETIAPINE FUMARATE ER 50 MG PO TB24: ORAL_TABLET | ORAL | 1 refills | Status: DC

## 2021-10-18 MED ORDER — HYDROXYZINE PAMOATE 25 MG PO CAPS: 25.0000 mg | ORAL_CAPSULE | Freq: Three times a day (TID) | ORAL | 1 refills | Status: DC | PRN

## 2021-10-18 MED ORDER — PAROXETINE HCL 20 MG PO TABS: 30.0000 mg | ORAL_TABLET | Freq: Every day | ORAL | 1 refills | Status: DC

## 2021-10-18 NOTE — Progress Notes (Signed)
[place] MD OP Progress Note  10/18/2021 11:29 AM Karen Cox  MRN:  [id number]  Chief Complaint:  Chief Complaint   Follow-up; Anxiety; Depression    HPI: Karen Cox is a 61 year old African-American female, lives in [place], has a history of GAD, schizoaffective disorder, borderline intellectual functioning, morbid obesity, seasonal allergies, insomnia was evaluated in office today.  Patient today reports overall mood symptoms are currently stable.  She reports she is compliant on the medications.  Denies any side effects to Seroquel, Paxil or Wellbutrin.  Patient reports sleep is overall okay.  Denies any auditory hallucinations or other perceptual disturbances at this time.  Patient reports appetite as fair.  Patient denies any suicidality, homicidality.  Patient reports she has established care with her therapist at [facility] and does have an upcoming appointment.  Patient denies any other concerns today.  Visit Diagnosis:    ICD-10-CM   1. Schizoaffective disorder, depressive type ([place])  F25.1 QUEtiapine (SEROQUEL XR) 50 MG TB24 24 hr tablet    PARoxetine (PAXIL) 20 MG tablet    2. GAD (generalized anxiety disorder)  F41.1 buPROPion (WELLBUTRIN XL) 300 MG 24 hr tablet    hydrOXYzine (VISTARIL) 25 MG capsule    3. Borderline intellectual functioning  R41.83     4. High risk medication use  Z79.899       Past Psychiatric History: Reviewed past psychiatric history from progress note on 12/09/2019.  Past trials of BuSpar, hydroxyzine, melatonin, Paxil, Wellbutrin  Past Medical History:  Past Medical History:  Diagnosis Date   Allergy    Anxiety    Cervical high risk HPV (human papillomavirus) test positive 05/17/2014   Depression    Eczema    GERD (gastroesophageal reflux disease)    Hot flashes 09/01/2013   Hyperlipidemia    Mild mental slowing    Peri-menopause 08/05/2013   PMB (postmenopausal bleeding) 11/16/2013   Urticaria    Vaginal itching  08/05/2013   Yeast infection 08/05/2013    Past Surgical History:  Procedure Laterality Date   CERVICAL POLYPECTOMY N/A 01/06/2014   Procedure: CERVICAL POLYPECTOMY;  Surgeon: Florian Buff, MD;  Location: [facility];  Service: Gynecology;  Laterality: N/A;   COLONOSCOPY  09/19/2012   3 mm sessile polyp removed from the colon (simple adenoma).  Advised 10-year follow-up colonoscopy   DILITATION & CURRETTAGE/HYSTROSCOPY WITH THERMACHOICE ABLATION N/A 01/06/2014   Procedure: DILATATION & CURETTAGE/HYSTEROSCOPY WITH THERMACHOICE ABLATION;  Surgeon: Florian Buff, MD;  Location: [facility];  Service: Gynecology;  Laterality: N/A;  total therapy time:  9 minutes 12 seconds ,     temperature:87 degrees    Family Psychiatric History: Reviewed family psychiatric history from progress note on 12/09/2019.  Family History:  Family History  Problem Relation Age of Onset   Diabetes Father    Heart disease Father    Hypertension Father    Hyperlipidemia Father    Diabetes Maternal Uncle    Anemia Maternal Uncle    Arthritis Maternal Uncle    Anxiety disorder Maternal Uncle    Arthritis Maternal Grandmother    Anxiety disorder Maternal Grandmother    Heart disease Maternal Grandfather        pacemaker   Anxiety disorder Daughter    Colon cancer Neg Hx    Cancer Neg Hx    Allergic rhinitis Neg Hx    Angioedema Neg Hx    Asthma Neg Hx    Eczema Neg Hx    Immunodeficiency Neg Hx  Urticaria Neg Hx    Atopy Neg Hx     Social History: Reviewed social history from progress note on 12/09/2019. Social History   Socioeconomic History   Marital status: Single    Spouse name: Not on file   Number of children: 1   Years of education: Not on file   Highest education level: High school graduate  Occupational History   Not on file  Tobacco Use   Smoking status: Former    Packs/day: 0.25    Years: 3.00    Pack years: 0.75    Types: Cigarettes    Quit date: 09/03/1981    Years since quitting: 40.1    Smokeless tobacco: Never  Vaping Use   Vaping Use: Never used  Substance and Sexual Activity   Alcohol use: No   Drug use: No   Sexual activity: Never    Birth control/protection: Post-menopausal  Other Topics Concern   Not on file  Social History Narrative   Not on file   Social Determinants of Health   Financial Resource Strain: Not on file  Food Insecurity: Not on file  Transportation Needs: Not on file  Physical Activity: Not on file  Stress: Not on file  Social Connections: Not on file    Allergies:  Allergies  Allergen Reactions   Benadryl  [Diphenhydramine Hcl (Sleep)]    Benadryl [Diphenhydramine Hcl] Other (See Comments)    Makes congestion worst    Dextromethorphan Hbr Hives   Robitussin (Alcohol Free) [Guaifenesin]     Makes cough worse   Aspirin Rash and Hives    rash   Diphenhydramine Hcl Hives and Rash    Makes congestion worst    Latex Rash    Metabolic Disorder Labs: Lab Results  Component Value Date   HGBA1C 5.6 07/25/2021   MPG 114 07/25/2021   MPG 94 08/18/2013   Lab Results  Component Value Date   PROLACTIN 18.6 07/25/2021   Lab Results  Component Value Date   CHOL 203 (H) 04/21/2021   TRIG 211 (H) 04/21/2021   HDL 35 (L) 04/21/2021   CHOLHDL 3.7 03/18/2020   VLDL 20 08/17/2016   LDLCALC 130 (H) 04/21/2021   LDLCALC 134 (H) 03/18/2020   Lab Results  Component Value Date   TSH 2.346 07/25/2021   TSH 1.83 03/18/2020    Therapeutic Level Labs: No results found for: LITHIUM No results found for: VALPROATE No components found for:  CBMZ  Current Medications: Current Outpatient Medications  Medication Sig Dispense Refill   amoxicillin (AMOXIL) 500 MG capsule Take 500 mg by mouth 3 (three) times daily.     Blood Pressure Monitoring (BLOOD PRESSURE MONITOR AUTOMAT) DEVI 1 each by Does not apply route daily. 1 each 0   ESTRING 2 MG vaginal ring      medroxyPROGESTERone (PROVERA) 5 MG tablet Take 5 mg by mouth daily.     Multiple  Vitamins-Minerals (HAIR SKIN NAILS PO) Take by mouth daily.     pantoprazole (PROTONIX) 40 MG tablet TAKE 1 TABLET BY MOUTH TWICE A DAY BEFORE A MEAL. 60 tablet 3   propranolol ER (INDERAL LA) 60 MG 24 hr capsule Take 1 capsule (60 mg total) by mouth daily. 180 capsule 1   Vitamin D, Ergocalciferol, (DRISDOL) 1.25 MG (50000 UNIT) CAPS capsule TAKE 1 CAPSULE BY MOUTH ONCE A WEEK. 12 capsule 0   buPROPion (WELLBUTRIN XL) 300 MG 24 hr tablet Take 1 tablet (300 mg total) by mouth every morning. [place]  tablet 1   hydrOXYzine (VISTARIL) 25 MG capsule Take 1 capsule (25 mg total) by mouth 3 (three) times daily as needed. For severe anxiety attacks only 90 capsule 1   PARoxetine (PAXIL) 20 MG tablet Take 1.5 tablets (30 mg total) by mouth daily. 135 tablet 1   QUEtiapine (SEROQUEL XR) 50 MG TB24 24 hr tablet TAKE (2) TABLETS BY MOUTH AT BEDTIME. 180 tablet 1   Current Facility-Administered Medications  Medication Dose Route Frequency Provider Last Rate Last Admin   omalizumab Arvid Right) injection 300 mg  300 mg Subcutaneous Q28 days Valentina Shaggy, MD   300 mg at 12/31/17 1035     Musculoskeletal: Strength & Muscle Tone: within normal limits Gait & Station: normal Patient leans: N/A  Psychiatric Specialty Exam: Review of Systems  Psychiatric/Behavioral:  Negative for agitation, behavioral problems, confusion, decreased concentration, dysphoric mood, hallucinations, self-injury, sleep disturbance and suicidal ideas. The patient is not nervous/anxious and is not hyperactive.   All other systems reviewed and are negative.  Blood pressure 140/88, pulse 87, temperature 97.6 F (36.4 C), temperature source Temporal, weight 284 lb (128.8 kg), last menstrual period 06/27/2013.Body mass index is 47.26 kg/m.  General Appearance: Casual  Eye Contact:  Fair  Speech:  Clear and Coherent  Volume:  Normal  Mood:  Euthymic  Affect:  Congruent  Thought Process:  Linear and Descriptions of Associations:  Intact  Orientation:  Full (Time, Place, and Person)  Thought Content: Logical   Suicidal Thoughts:  No  Homicidal Thoughts:  No  Memory:  Immediate;   Fair Recent;   Fair Remote;   limited  Judgement:  Fair  Insight:  Fair  Psychomotor Activity:  Normal  Concentration:  Concentration: Fair and Attention Span: Fair  Recall:  [employer] of Knowledge: Fair  Language: Fair  Akathisia:  No  Handed:  Right  AIMS (if indicated): done, 0  Assets:  Communication Skills Desire for Improvement Housing Social Support  ADL's:  Intact  Cognition: baseline  Sleep:  Fair   Screenings: GAD-7    [place] Office Visit from 10/13/2019 in [facility] Office Visit from 09/08/2019 in [facility]  Total GAD-7 Score 7 15      PHQ2-9    Valentine Visit from 10/18/2021 in [facility] Counselor from 08/10/2021 in [facility] Office Visit from 07/25/2021 in [facility] Office Visit from 04/21/2021 in [facility] Video Visit from 04/18/2021 in [facility]  PHQ-2 Total Score 4 2 1 1 1   PHQ-9 Total Score 8 6 -- -- 2      Flowsheet Row Counselor from 08/10/2021 in [facility] Office Visit from 01/17/2021 in [facility] No Risk No Risk        Assessment and Plan: Karen Cox is a 61 year old African-American female who lives in [place], has a history of GAD, schizoaffective disorder, borderline intellectual functioning, morbid obesity was evaluated in office today.  Patient is currently stable.  Plan as noted below.  Plan Schizoaffective disorder-stable Wellbutrin 300 mg p.o. daily Paxil 30 mg p.o. daily Seroquel extended release 100 mg p.o. nightly  GAD-stable Paxil 30 mg p.o. daily Hydroxyzine 25 mg p.o. 3 times daily as needed for  severe anxiety attacks Seroquel as prescribed  High risk medication use-reviewed labs-TSH-2.346-within normal limits, prolactin-18.6-within normal limits, hemoglobin A1c-5.6-within normal limits.  Patient advised to keep her  appointment with her therapist-upcoming appointment with Ms. Mary Bowman-09/12/2021.  We will coordinate care.  Patient advised to follow up with her primary medical doctor-Dr. Moshe Cipro for elevated blood pressure reading.  Provided education.  Follow-up in clinic in 3 months or sooner if needed.  This note was generated in part or whole with voice recognition software. Voice recognition is usually quite accurate but there are transcription errors that can and very often do occur. I apologize for any typographical errors that were not detected and corrected.         Ursula Alert, MD 10/18/2021, 11:29 AM

## 2021-10-23 ENCOUNTER — Ambulatory Visit: Payer: Medicaid Other | Admitting: Family Medicine

## 2021-10-31 ENCOUNTER — Ambulatory Visit: Payer: Medicaid Other | Admitting: Family Medicine

## 2021-11-13 ENCOUNTER — Ambulatory Visit (HOSPITAL_COMMUNITY): Payer: Medicaid Other | Admitting: Licensed Clinical Social Worker

## 2021-11-13 NOTE — Progress Notes (Signed)
Patient did not show for assessment. Session is a no show

## 2021-11-15 ENCOUNTER — Ambulatory Visit (HOSPITAL_COMMUNITY): Payer: Medicaid Other

## 2021-11-15 ENCOUNTER — Telehealth: Payer: Self-pay | Admitting: Family Medicine

## 2021-11-15 NOTE — Telephone Encounter (Signed)
Please send refill to [facility]   Propranolol

## 2021-11-16 ENCOUNTER — Ambulatory Visit: Payer: Medicaid Other | Admitting: Family Medicine

## 2021-11-16 ENCOUNTER — Other Ambulatory Visit: Payer: Self-pay

## 2021-11-16 MED ORDER — PROPRANOLOL HCL ER 60 MG PO CP24: 60.0000 mg | ORAL_CAPSULE | Freq: Every day | ORAL | 0 refills | Status: DC

## 2021-11-16 NOTE — Telephone Encounter (Signed)
Refill sent pt appt in April

## 2021-12-08 ENCOUNTER — Ambulatory Visit: Payer: Medicaid Other | Admitting: Family Medicine

## 2021-12-13 ENCOUNTER — Ambulatory Visit: Payer: Medicaid Other | Admitting: Family Medicine

## 2021-12-16 NOTE — Progress Notes (Deleted)
? ? ?Referring Provider: Fayrene Helper, MD ?Primary Care Physician:  Fayrene Helper, MD ?Primary GI Physician: Dr. Marland Kitchen ? ?No chief complaint on file. ? ? ?HPI:   ?Karen Cox is a 61 y.o. female with a history of GERD and dysphagia. ?  ?Last seen in our office 12/26/2020.  Reported burning in the back of her throat typically occurring in the evening after she lays down.  She has been on multiple medications without relief, some of this with allergy medication, also given Pepcid which worked well initially, but lost effect.  Noted 70 pound weight gain over the last year with poor dietary choices.  Also admitted to laying down right after eating or sometimes laying down while eating.  Denies abdominal pain.  Reported occasional sensation of food sticking in the upper chest requiring regurgitation.  No significant lower GI symptoms or alarm symptoms.  Plan to start Protonix 40 mg twice daily, discussed antireflux measures, discussed need for weight loss.  Had planned to follow-up in 6 weeks with Dr. Abbey Chatters and consider EGD if not significantly improved. ?  ?Patient no-show to her follow-up in April and canceled her follow-up in June and October.  ?  ?Today: ? ? ? ? ?She will be due for colonoscopy in December 2023. ? ?Past Medical History:  ?Diagnosis Date  ? Allergy   ? Anxiety   ? Cervical high risk HPV (human papillomavirus) test positive 05/17/2014  ? Depression   ? Eczema   ? GERD (gastroesophageal reflux disease)   ? Hot flashes 09/01/2013  ? Hyperlipidemia   ? Mild mental slowing   ? Peri-menopause 08/05/2013  ? PMB (postmenopausal bleeding) 11/16/2013  ? Urticaria   ? Vaginal itching 08/05/2013  ? Yeast infection 08/05/2013  ? ? ?Past Surgical History:  ?Procedure Laterality Date  ? CERVICAL POLYPECTOMY N/A 01/06/2014  ? Procedure: CERVICAL POLYPECTOMY;  Surgeon: Florian Buff, MD;  Location: [facility];  Service: Gynecology;  Laterality: N/A;  ? COLONOSCOPY  09/19/2012  ? 3 mm sessile polyp removed  from the colon (simple adenoma).  Advised 10-year follow-up colonoscopy  ? DILITATION & CURRETTAGE/HYSTROSCOPY WITH THERMACHOICE ABLATION N/A 01/06/2014  ? Procedure: DILATATION & CURETTAGE/HYSTEROSCOPY WITH THERMACHOICE ABLATION;  Surgeon: Florian Buff, MD;  Location: [facility];  Service: Gynecology;  Laterality: N/A;  total therapy time:  9 minutes 12 seconds ,     temperature:87 degrees  ? ? ?Current Outpatient Medications  ?Medication Sig Dispense Refill  ? amoxicillin (AMOXIL) 500 MG capsule Take 500 mg by mouth 3 (three) times daily.    ? Blood Pressure Monitoring (BLOOD PRESSURE MONITOR AUTOMAT) DEVI 1 each by Does not apply route daily. 1 each 0  ? buPROPion (WELLBUTRIN XL) 300 MG 24 hr tablet Take 1 tablet (300 mg total) by mouth every morning. 90 tablet 1  ? ESTRING 2 MG vaginal ring     ? hydrOXYzine (VISTARIL) 25 MG capsule Take 1 capsule (25 mg total) by mouth 3 (three) times daily as needed. For severe anxiety attacks only 90 capsule 1  ? medroxyPROGESTERone (PROVERA) 5 MG tablet Take 5 mg by mouth daily.    ? Multiple Vitamins-Minerals (HAIR SKIN NAILS PO) Take by mouth daily.    ? pantoprazole (PROTONIX) 40 MG tablet TAKE 1 TABLET BY MOUTH TWICE A DAY BEFORE A MEAL. 60 tablet 3  ? PARoxetine (PAXIL) 20 MG tablet Take 1.5 tablets (30 mg total) by mouth daily. 135 tablet 1  ? propranolol ER (INDERAL LA)  60 MG 24 hr capsule Take 1 capsule (60 mg total) by mouth daily. 180 capsule 0  ? QUEtiapine (SEROQUEL XR) 50 MG TB24 24 hr tablet TAKE (2) TABLETS BY MOUTH AT BEDTIME. 180 tablet 1  ? Vitamin D, Ergocalciferol, (DRISDOL) 1.25 MG (50000 UNIT) CAPS capsule TAKE 1 CAPSULE BY MOUTH ONCE A WEEK. 12 capsule 0  ? ?Current Facility-Administered Medications  ?Medication Dose Route Frequency Provider Last Rate Last Admin  ? omalizumab Arvid Right) injection 300 mg  300 mg Subcutaneous Q28 days Valentina Shaggy, MD   300 mg at 12/31/17 1035  ? ? ?Allergies as of 12/18/2021 - Review Complete 10/18/2021  ?Allergen  Reaction Noted  ? Benadryl  [diphenhydramine hcl (sleep)]    ? Benadryl [diphenhydramine hcl] Other (See Comments) 12/31/2010  ? Dextromethorphan hbr Hives 05/29/2016  ? Robitussin (alcohol free) [guaifenesin]  05/17/2014  ? Aspirin Rash and Hives 05/08/2011  ? Diphenhydramine hcl Hives and Rash 03/01/2016  ? Latex Rash 09/04/2011  ? ? ?Family History  ?Problem Relation Age of Onset  ? Diabetes Father   ? Heart disease Father   ? Hypertension Father   ? Hyperlipidemia Father   ? Diabetes Maternal Uncle   ? Anemia Maternal Uncle   ? Arthritis Maternal Uncle   ? Anxiety disorder Maternal Uncle   ? Arthritis Maternal Grandmother   ? Anxiety disorder Maternal Grandmother   ? Heart disease Maternal Grandfather   ?     pacemaker  ? Anxiety disorder Daughter   ? Colon cancer Neg Hx   ? Cancer Neg Hx   ? Allergic rhinitis Neg Hx   ? Angioedema Neg Hx   ? Asthma Neg Hx   ? Eczema Neg Hx   ? Immunodeficiency Neg Hx   ? Urticaria Neg Hx   ? Atopy Neg Hx   ? ? ?Social History  ? ?Socioeconomic History  ? Marital status: Single  ?  Spouse name: Not on file  ? Number of children: 1  ? Years of education: Not on file  ? Highest education level: High school graduate  ?Occupational History  ? Not on file  ?Tobacco Use  ? Smoking status: Former  ?  Packs/day: 0.25  ?  Years: 3.00  ?  Pack years: 0.75  ?  Types: Cigarettes  ?  Quit date: 09/03/1981  ?  Years since quitting: 40.3  ? Smokeless tobacco: Never  ?Vaping Use  ? Vaping Use: Never used  ?Substance and Sexual Activity  ? Alcohol use: No  ? Drug use: No  ? Sexual activity: Never  ?  Birth control/protection: Post-menopausal  ?Other Topics Concern  ? Not on file  ?Social History Narrative  ? Not on file  ? ?Social Determinants of Health  ? ?Financial Resource Strain: Not on file  ?Food Insecurity: Not on file  ?Transportation Needs: Not on file  ?Physical Activity: Not on file  ?Stress: Not on file  ?Social Connections: Not on file  ? ? ?Review of Systems: ?Gen: Denies fever,  chills, anorexia. Denies fatigue, weakness, weight loss.  ?CV: Denies chest pain, palpitations, syncope, peripheral edema, and claudication. ?Resp: Denies dyspnea at rest, cough, wheezing, coughing up blood, and pleurisy. ?GI: Denies vomiting blood, jaundice, and fecal incontinence.   Denies dysphagia or odynophagia. ?Derm: Denies rash, itching, dry skin ?Psych: Denies depression, anxiety, memory loss, confusion. No homicidal or suicidal ideation.  ?Heme: Denies bruising, bleeding, and enlarged lymph nodes. ? ?Physical Exam: ?LMP 06/27/2013  ?General:   Alert  and oriented. No distress noted. Pleasant and cooperative.  ?Head:  Normocephalic and atraumatic. ?Eyes:  Conjuctiva clear without scleral icterus. ?Mouth:  Oral mucosa pink and moist. Good dentition. No lesions. ?Heart:  S1, S2 present without murmurs appreciated. ?Lungs:  Clear to auscultation bilaterally. No wheezes, rales, or rhonchi. No distress.  ?Abdomen:  +BS, soft, non-tender and non-distended. No rebound or guarding. No HSM or masses noted. ?Msk:  Symmetrical without gross deformities. Normal posture. ?Extremities:  Without edema. ?Neurologic:  Alert and  oriented x4 ?Psych:  Alert and cooperative. Normal mood and affect.  ?

## 2021-12-18 ENCOUNTER — Encounter: Payer: Self-pay | Admitting: Internal Medicine

## 2021-12-18 ENCOUNTER — Ambulatory Visit: Payer: Medicaid Other | Admitting: Gastroenterology

## 2022-01-04 ENCOUNTER — Ambulatory Visit (HOSPITAL_COMMUNITY): Payer: Medicaid Other

## 2022-01-08 ENCOUNTER — Ambulatory Visit (HOSPITAL_COMMUNITY): Payer: Medicaid Other

## 2022-01-11 ENCOUNTER — Ambulatory Visit: Payer: Medicaid Other | Admitting: Gastroenterology

## 2022-01-24 ENCOUNTER — Telehealth: Payer: Medicaid Other | Admitting: Psychiatry

## 2022-01-25 ENCOUNTER — Ambulatory Visit: Payer: Medicaid Other | Admitting: Family Medicine

## 2022-02-02 ENCOUNTER — Other Ambulatory Visit: Payer: Self-pay | Admitting: Gastroenterology

## 2022-02-12 NOTE — Progress Notes (Deleted)
Primary Care Physician:  Fayrene Helper, MD Primary Gastroenterologist:  Dr. Abbey Chatters  No chief complaint on file.   HPI:   Karen Cox is a 61 y.o. female presenting today with a history of GERD and dysphagia.   Last seen in our office 12/26/2020.  Reported burning in the back of her throat typically occurring in the evening after she lays down.  She has been on multiple medications without relief, some of this with allergy medication, also given Pepcid which worked well initially, but lost effect.  Noted 70 pound weight gain over the last year with poor dietary choices.  Also admitted to laying down right after eating or sometimes laying down while eating.  Denies abdominal pain.  Reported occasional sensation of food sticking in the upper chest requiring regurgitation.  No significant lower GI symptoms or alarm symptoms.  Plan to start Protonix 40 mg twice daily, discussed antireflux measures, discussed need for weight loss.  Had planned to follow-up in 6 weeks with Dr. Abbey Chatters and consider EGD if not significantly improved.   Patient has no showed or cancelled several appointments.    Today:    Due for colonoscopy in December 2023.   Past Medical History:  Diagnosis Date   Allergy    Anxiety    Cervical high risk HPV (human papillomavirus) test positive 05/17/2014   Depression    Eczema    GERD (gastroesophageal reflux disease)    Hot flashes 09/01/2013   Hyperlipidemia    Mild mental slowing    Peri-menopause 08/05/2013   PMB (postmenopausal bleeding) 11/16/2013   Urticaria    Vaginal itching 08/05/2013   Yeast infection 08/05/2013    Past Surgical History:  Procedure Laterality Date   CERVICAL POLYPECTOMY N/A 01/06/2014   Procedure: CERVICAL POLYPECTOMY;  Surgeon: Florian Buff, MD;  Location: [facility];  Service: Gynecology;  Laterality: N/A;   COLONOSCOPY  09/19/2012   3 mm sessile polyp removed from the colon (simple adenoma).  Advised 10-year follow-up  colonoscopy   DILITATION & CURRETTAGE/HYSTROSCOPY WITH THERMACHOICE ABLATION N/A 01/06/2014   Procedure: DILATATION & CURETTAGE/HYSTEROSCOPY WITH THERMACHOICE ABLATION;  Surgeon: Florian Buff, MD;  Location: [facility];  Service: Gynecology;  Laterality: N/A;  total therapy time:  9 minutes 12 seconds ,     temperature:87 degrees    Current Outpatient Medications  Medication Sig Dispense Refill   amoxicillin (AMOXIL) 500 MG capsule Take 500 mg by mouth 3 (three) times daily.     Blood Pressure Monitoring (BLOOD PRESSURE MONITOR AUTOMAT) DEVI 1 each by Does not apply route daily. 1 each 0   buPROPion (WELLBUTRIN XL) 300 MG 24 hr tablet Take 1 tablet (300 mg total) by mouth every morning. 90 tablet 1   ESTRING 2 MG vaginal ring      hydrOXYzine (VISTARIL) 25 MG capsule Take 1 capsule (25 mg total) by mouth 3 (three) times daily as needed. For severe anxiety attacks only 90 capsule 1   medroxyPROGESTERone (PROVERA) 5 MG tablet Take 5 mg by mouth daily.     Multiple Vitamins-Minerals (HAIR SKIN NAILS PO) Take by mouth daily.     pantoprazole (PROTONIX) 40 MG tablet TAKE 1 TABLET BY MOUTH TWICE A DAY BEFORE A MEAL. 60 tablet 0   PARoxetine (PAXIL) 20 MG tablet Take 1.5 tablets (30 mg total) by mouth daily. 135 tablet 1   propranolol ER (INDERAL LA) 60 MG 24 hr capsule Take 1 capsule (60 mg total) by mouth daily.  180 capsule 0   QUEtiapine (SEROQUEL XR) 50 MG TB24 24 hr tablet TAKE (2) TABLETS BY MOUTH AT BEDTIME. 180 tablet 1   Vitamin D, Ergocalciferol, (DRISDOL) 1.25 MG (50000 UNIT) CAPS capsule TAKE 1 CAPSULE BY MOUTH ONCE A WEEK. 12 capsule 0   Current Facility-Administered Medications  Medication Dose Route Frequency Provider Last Rate Last Admin   omalizumab Arvid Right) injection 300 mg  300 mg Subcutaneous Q28 days Valentina Shaggy, MD   300 mg at 12/31/17 1035    Allergies as of 02/14/2022 - Review Complete 10/18/2021  Allergen Reaction Noted   Benadryl  [diphenhydramine hcl (sleep)]      Benadryl [diphenhydramine hcl] Other (See Comments) 12/31/2010   Dextromethorphan hbr Hives 05/29/2016   Robitussin (alcohol free) [guaifenesin]  05/17/2014   Aspirin Rash and Hives 05/08/2011   Diphenhydramine hcl Hives and Rash 03/01/2016   Latex Rash 09/04/2011    Family History  Problem Relation Age of Onset   Diabetes Father    Heart disease Father    Hypertension Father    Hyperlipidemia Father    Diabetes Maternal Uncle    Anemia Maternal Uncle    Arthritis Maternal Uncle    Anxiety disorder Maternal Uncle    Arthritis Maternal Grandmother    Anxiety disorder Maternal Grandmother    Heart disease Maternal Grandfather        pacemaker   Anxiety disorder Daughter    Colon cancer Neg Hx    Cancer Neg Hx    Allergic rhinitis Neg Hx    Angioedema Neg Hx    Asthma Neg Hx    Eczema Neg Hx    Immunodeficiency Neg Hx    Urticaria Neg Hx    Atopy Neg Hx     Social History   Socioeconomic History   Marital status: Single    Spouse name: Not on file   Number of children: 1   Years of education: Not on file   Highest education level: High school graduate  Occupational History   Not on file  Tobacco Use   Smoking status: Former    Packs/day: 0.25    Years: 3.00    Pack years: 0.75    Types: Cigarettes    Quit date: 09/03/1981    Years since quitting: 40.4   Smokeless tobacco: Never  Vaping Use   Vaping Use: Never used  Substance and Sexual Activity   Alcohol use: No   Drug use: No   Sexual activity: Never    Birth control/protection: Post-menopausal  Other Topics Concern   Not on file  Social History Narrative   Not on file   Social Determinants of Health   Financial Resource Strain: Not on file  Food Insecurity: Not on file  Transportation Needs: Not on file  Physical Activity: Not on file  Stress: Not on file  Social Connections: Not on file  Intimate Partner Violence: Not on file    Review of Systems: Gen: Denies any fever, chills, cold or flu  like symptoms, pre-syncope, or syncope.  CV: Denies chest pain or heart palpitations. Resp: Denies shortness of breath or cough.  GI: See HPI GU : Denies urinary burning. MS: Denies joint pain. Derm: Denies rash. Psych: Denies depression, anxiety. Heme: Denies bruising, bleeding.  Physical Exam: LMP 06/27/2013  General:   Alert and oriented. Pleasant and cooperative. Well-nourished and well-developed.  Head:  Normocephalic and atraumatic. Eyes:  Without icterus, sclera clear and conjunctiva pink.  Ears:  Normal auditory acuity.  Lungs:  Clear to auscultation bilaterally. No wheezes, rales, or rhonchi. No distress.  Heart:  S1, S2 present without murmurs appreciated.  Abdomen:  +BS, soft, non-tender and non-distended. No HSM noted. No guarding or rebound. No masses appreciated.  Rectal:  Deferred  Msk:  Symmetrical without gross deformities. Normal posture. Extremities:  Without edema. Neurologic:  Alert and  oriented x4;  grossly normal neurologically. Skin:  Intact without significant lesions or rashes. Psych:  Normal mood and affect.    Assessment:     Plan:  ***   Aliene Altes, PA-C [facility] 02/14/2022

## 2022-02-14 ENCOUNTER — Ambulatory Visit: Payer: Medicaid Other | Admitting: Gastroenterology

## 2022-02-14 ENCOUNTER — Ambulatory Visit (HOSPITAL_COMMUNITY): Payer: Medicaid Other

## 2022-02-16 ENCOUNTER — Encounter: Payer: Self-pay | Admitting: Family Medicine

## 2022-02-16 ENCOUNTER — Ambulatory Visit (INDEPENDENT_AMBULATORY_CARE_PROVIDER_SITE_OTHER): Payer: Medicaid Other | Admitting: Family Medicine

## 2022-02-16 VITALS — BP 152/92 | HR 80 | Resp 16 | Ht 65.0 in | Wt 279.0 lb

## 2022-02-16 DIAGNOSIS — E785 Hyperlipidemia, unspecified: Secondary | ICD-10-CM | POA: Diagnosis not present

## 2022-02-16 DIAGNOSIS — I10 Essential (primary) hypertension: Secondary | ICD-10-CM

## 2022-02-16 DIAGNOSIS — F411 Generalized anxiety disorder: Secondary | ICD-10-CM

## 2022-02-16 DIAGNOSIS — L98491 Non-pressure chronic ulcer of skin of other sites limited to breakdown of skin: Secondary | ICD-10-CM | POA: Diagnosis not present

## 2022-02-16 DIAGNOSIS — E559 Vitamin D deficiency, unspecified: Secondary | ICD-10-CM

## 2022-02-16 DIAGNOSIS — R7303 Prediabetes: Secondary | ICD-10-CM

## 2022-02-16 MED ORDER — CEPHALEXIN 500 MG PO CAPS: 500.0000 mg | ORAL_CAPSULE | Freq: Two times a day (BID) | ORAL | 0 refills | Status: DC

## 2022-02-16 MED ORDER — TRIAMTERENE-HCTZ 37.5-25 MG PO TABS: 1.0000 | ORAL_TABLET | Freq: Every day | ORAL | 3 refills | Status: DC

## 2022-02-16 NOTE — Patient Instructions (Addendum)
F/U in 8 weeks, re evaluate weight and blood [pressure, call if you need me sooner ? ?New additional med for blood pressure is triamterene one daily ? ?Keflex is prescribed for 5 days for skin ulcer, then use vaseline ? ?Colonoscopy is due in December , GI will arrange ? ?Labs today cBC, lipid, cmp and EGFr , hBA1C and vitd ? ?It is important that you exercise regularly at least 30 minutes 5 times a week. If you develop chest pain, have severe difficulty breathing, or feel very tired, stop exercising immediately and seek medical attention  ? ?Think about what you will eat, plan ahead. ?Choose " clean, green, fresh or frozen" over canned, processed or packaged foods which are more sugary, salty and fatty. ?70 to 75% of food eaten should be vegetables and fruit. ?Three meals at set times with snacks allowed between meals, but they must be fruit or vegetables. ?Aim to eat over a 12 hour period , example 7 am to 7 pm, and STOP after  your last meal of the day. ?Drink water,generally about 64 ounces per day, no other drink is as healthy. Fruit juice is best enjoyed in a healthy way, by EATING the fruit. ?Thanks for choosing [facility], we consider it a privelige to serve you. ? ?

## 2022-02-17 LAB — CMP14+EGFR
ALT: 20 IU/L (ref 0–32)
AST: 28 IU/L (ref 0–40)
Albumin/Globulin Ratio: 1.5 (ref 1.2–2.2)
Albumin: 4.5 g/dL (ref 3.8–4.9)
Alkaline Phosphatase: 123 IU/L — ABNORMAL HIGH (ref 44–121)
BUN/Creatinine Ratio: 9 (ref 9–23)
BUN: 9 mg/dL (ref 6–24)
Bilirubin Total: 0.6 mg/dL (ref 0.0–1.2)
CO2: 21 mmol/L (ref 20–29)
Calcium: 9.8 mg/dL (ref 8.7–10.2)
Chloride: 102 mmol/L (ref 96–106)
Creatinine, Ser: 0.97 mg/dL (ref 0.57–1.00)
Globulin, Total: 3 g/dL (ref 1.5–4.5)
Glucose: 105 mg/dL — ABNORMAL HIGH (ref 70–99)
Potassium: 4.9 mmol/L (ref 3.5–5.2)
Sodium: 137 mmol/L (ref 134–144)
Total Protein: 7.5 g/dL (ref 6.0–8.5)
eGFR: 67 mL/min/{1.73_m2} (ref >59)

## 2022-02-17 LAB — CBC
Hematocrit: 38.8 % (ref 34.0–46.6)
Hemoglobin: 12.6 g/dL (ref 11.1–15.9)
MCH: 26.6 pg (ref 26.6–33.0)
MCHC: 32.5 g/dL (ref 31.5–35.7)
MCV: 82 fL (ref 79–97)
Platelets: 318 10*3/uL (ref 150–450)
RBC: 4.74 x10E6/uL (ref 3.77–5.28)
RDW: 14.4 % (ref 11.7–15.4)
WBC: 6.9 10*3/uL (ref 3.4–10.8)

## 2022-02-17 LAB — HEMOGLOBIN A1C
Est. average glucose Bld gHb Est-mCnc: 117 mg/dL
Hgb A1c MFr Bld: 5.7 % — ABNORMAL HIGH (ref 4.8–5.6)

## 2022-02-17 LAB — LIPID PANEL
Chol/HDL Ratio: 5.2 ratio — ABNORMAL HIGH (ref 0.0–4.4)
Cholesterol, Total: 193 mg/dL (ref 100–199)
HDL: 37 mg/dL — ABNORMAL LOW (ref >39)
LDL Chol Calc (NIH): 129 mg/dL — ABNORMAL HIGH (ref 0–99)
Triglycerides: 147 mg/dL (ref 0–149)
VLDL Cholesterol Cal: 27 mg/dL (ref 5–40)

## 2022-02-17 LAB — VITAMIN D 25 HYDROXY (VIT D DEFICIENCY, FRACTURES): Vit D, 25-Hydroxy: 25.1 ng/mL — ABNORMAL LOW (ref 30.0–100.0)

## 2022-02-18 ENCOUNTER — Encounter: Payer: Self-pay | Admitting: Family Medicine

## 2022-02-18 DIAGNOSIS — R7303 Prediabetes: Secondary | ICD-10-CM | POA: Insufficient documentation

## 2022-02-18 DIAGNOSIS — L98499 Non-pressure chronic ulcer of skin of other sites with unspecified severity: Secondary | ICD-10-CM | POA: Insufficient documentation

## 2022-02-18 NOTE — Assessment & Plan Note (Signed)
Keflex x 5 days ?

## 2022-02-18 NOTE — Assessment & Plan Note (Signed)
Uncorrected, need to sart daily OTC calcium 1000 IU ?

## 2022-02-18 NOTE — Progress Notes (Signed)
? ?Karen Cox     MRN: [id number]      DOB: 1960-11-25 ? ? ?HPI ?Karen Cox is here for follow up and re-evaluation of chronic medical conditions, medication management and review of any available recent lab and radiology data.  ?Preventive health is updated, specifically  Cancer screening and Immunization.   ?Questions or concerns regarding consultations or procedures which the PT has had in the interim are  addressed. ?The PT denies any adverse reactions to current medications since the last visit.  ?C/o recurrent ulcer between buttocks for months, she uses vaseline on this ?ROS ?Denies recent fever or chills. ?Denies sinus pressure, nasal congestion, ear pain or sore throat. ?Denies chest congestion, productive cough or wheezing. ?Denies chest pains, palpitations and leg swelling ?Denies abdominal pain, nausea, vomiting,diarrhea or constipation.   ?Denies dysuria, frequency, hesitancy or incontinence. ?Denies joint pain, swelling and limitation in mobility. ?Denies headaches, seizures, numbness, or tingling. ?Denies uncontrolled  depression, anxiety or insomnia. ?. ? ? ?PE ? ?BP (!) 152/92   Pulse 80   Resp 16   Ht '5\' 5"'$  (1.651 m)   Wt 279 lb (126.6 kg)   LMP 06/27/2013   SpO2 95%   BMI 46.43 kg/m?  ? ?Patient alert and oriented and in no cardiopulmonary distress. ? ?HEENT: No facial asymmetry, EOMI,     Neck supple . ? ?Chest: Clear to auscultation bilaterally. ? ?CVS: S1, S2 no murmurs, no S3.Regular rate. ? ?ABD: Soft non tender.  ? ?Ext: No edema ? ?MS: Adequate ROM spine, shoulders, hips and knees. ? ?Skin: Ulcer in upper gluteal fold approx 3 cm long ?Psych: Good eye contact, normal affect. Memory intact not anxious or depressed appearing. ? ?CNS: CN 2-12 intact, power,  normal throughout.no focal deficits noted. ? ? ?Assessment & Plan ? ?Essential hypertension ?Uncontrolled ?Add maxzide 25 mg and re eval ?DASH diet and commitment to daily physical activity for a minimum of 30 minutes  discussed and encouraged, as a part of hypertension management. ?The importance of attaining a healthy weight is also discussed. ? ? ?  02/16/2022  ? 11:14 AM 02/16/2022  ? 10:47 AM 10/18/2021  ? 11:01 AM 07/25/2021  ? 11:19 AM 04/21/2021  ? 11:10 AM 01/17/2021  ?  9:51 AM 12/30/2020  ? 10:14 AM  ?BP/Weight  ?Systolic BP 229 798   921  153  ?Diastolic BP 92 91   76  83  ?Wt. (Lbs)  279   277  277  ?BMI  46.43 kg/m2   46.1 kg/m2  46.1 kg/m2  ?  ? Information is confidential and restricted. Go to Review Flowsheets to unlock data.  ? ? ? ? ? ?GAD (generalized anxiety disorder) ?Managed by psych ? ?Morbid obesity ([place]) ? ?Patient re-educated about  the importance of commitment to a  minimum of 150 minutes of exercise per week as able. ? ?The importance of healthy food choices with portion control discussed, as well as eating regularly and within a 12 hour window most days. ?The need to choose "clean , green" food 50 to 75% of the time is discussed, as well as to make water the primary drink and set a goal of 64 ounces water daily. ? ?  ? ?  02/16/2022  ? 10:47 AM 10/18/2021  ? 11:01 AM 07/25/2021  ? 11:19 AM  ?Weight /BMI  ?Weight 279 lb    ?Height '5\' 5"'$  (1.651 m)    ?BMI 46.43 kg/m2    ?  ?  Information is confidential and restricted. Go to Review Flowsheets to unlock data.  ? ? ? ? ?Hyperlipidemia LDL goal <100 ?Hyperlipidemia:Low fat diet discussed and encouraged. ? ? ?Lipid Panel  ?Lab Results  ?Component Value Date  ? CHOL 193 02/16/2022  ? HDL 37 (L) 02/16/2022  ? LDLCALC 129 (H) 02/16/2022  ? TRIG 147 02/16/2022  ? CHOLHDL 5.2 (H) 02/16/2022  ? ? ?Needs to reduce fried and fatty foods, and increase exercise ? ? ?Vitamin D deficiency ?Uncorrected, need to sart daily OTC calcium 1000 IU ? ?Prediabetes ?Patient educated about the importance of limiting  Carbohydrate intake , the need to commit to daily physical activity for a minimum of 30 minutes , and to commit weight loss. ?The fact that changes in all these areas will  reduce or eliminate all together the development of diabetes is stressed.  ? ? ?  Latest Ref Rng & Units 02/16/2022  ? 11:32 AM 07/25/2021  ? 11:12 AM 04/21/2021  ?  2:15 PM 03/18/2020  ? 10:41 AM 08/27/2019  ?  8:03 PM  ?Diabetic Labs  ?HbA1c 4.8 - 5.6 % 5.7   5.6       ?Chol 100 - 199 mg/dL 193    203   221     ?HDL >39 mg/dL 37    35   59     ?Calc LDL 0 - 99 mg/dL 129    130   134     ?Triglycerides 0 - 149 mg/dL 147    211   149     ?Creatinine 0.57 - 1.00 mg/dL 0.97    0.89   0.94   0.88    ? ? ?  02/16/2022  ? 11:14 AM 02/16/2022  ? 10:47 AM 10/18/2021  ? 11:01 AM 07/25/2021  ? 11:19 AM 04/21/2021  ? 11:10 AM 01/17/2021  ?  9:51 AM 12/30/2020  ? 10:14 AM  ?BP/Weight  ?Systolic BP 562 130   865  153  ?Diastolic BP 92 91   76  83  ?Wt. (Lbs)  279   277  277  ?BMI  46.43 kg/m2   46.1 kg/m2  46.1 kg/m2  ?  ? Information is confidential and restricted. Go to Review Flowsheets to unlock data.  ? ?   ? View : No data to display.  ?  ?  ?  ? ? ? ? ?Skin ulcer ([place]) ?Keflex x 5 days ? ? ?

## 2022-02-18 NOTE — Assessment & Plan Note (Signed)
Hyperlipidemia:Low fat diet discussed and encouraged. ? ? ?Lipid Panel  ?Lab Results  ?Component Value Date  ? CHOL 193 02/16/2022  ? HDL 37 (L) 02/16/2022  ? LDLCALC 129 (H) 02/16/2022  ? TRIG 147 02/16/2022  ? CHOLHDL 5.2 (H) 02/16/2022  ? ? ?Needs to reduce fried and fatty foods, and increase exercise ? ?

## 2022-02-18 NOTE — Assessment & Plan Note (Signed)
Managed by psych 

## 2022-02-18 NOTE — Assessment & Plan Note (Signed)
Patient educated about the importance of limiting  Carbohydrate intake , the need to commit to daily physical activity for a minimum of 30 minutes , and to commit weight loss. ?The fact that changes in all these areas will reduce or eliminate all together the development of diabetes is stressed.  ? ? ?  Latest Ref Rng & Units 02/16/2022  ? 11:32 AM 07/25/2021  ? 11:12 AM 04/21/2021  ?  2:15 PM 03/18/2020  ? 10:41 AM 08/27/2019  ?  8:03 PM  ?Diabetic Labs  ?HbA1c 4.8 - 5.6 % 5.7   5.6       ?Chol 100 - 199 mg/dL 193    203   221     ?HDL >39 mg/dL 37    35   59     ?Calc LDL 0 - 99 mg/dL 129    130   134     ?Triglycerides 0 - 149 mg/dL 147    211   149     ?Creatinine 0.57 - 1.00 mg/dL 0.97    0.89   0.94   0.88    ? ? ?  02/16/2022  ? 11:14 AM 02/16/2022  ? 10:47 AM 10/18/2021  ? 11:01 AM 07/25/2021  ? 11:19 AM 04/21/2021  ? 11:10 AM 01/17/2021  ?  9:51 AM 12/30/2020  ? 10:14 AM  ?BP/Weight  ?Systolic BP 726 203   559  153  ?Diastolic BP 92 91   76  83  ?Wt. (Lbs)  279   277  277  ?BMI  46.43 kg/m2   46.1 kg/m2  46.1 kg/m2  ?  ? Information is confidential and restricted. Go to Review Flowsheets to unlock data.  ? ?   ? View : No data to display.  ?  ?  ?  ? ? ? ?

## 2022-02-18 NOTE — Assessment & Plan Note (Signed)
?  Patient re-educated about  the importance of commitment to a  minimum of 150 minutes of exercise per week as able. ? ?The importance of healthy food choices with portion control discussed, as well as eating regularly and within a 12 hour window most days. ?The need to choose "clean , green" food 50 to 75% of the time is discussed, as well as to make water the primary drink and set a goal of 64 ounces water daily. ? ?  ? ?  02/16/2022  ? 10:47 AM 10/18/2021  ? 11:01 AM 07/25/2021  ? 11:19 AM  ?Weight /BMI  ?Weight 279 lb    ?Height '5\' 5"'$  (1.651 m)    ?BMI 46.43 kg/m2    ?  ? Information is confidential and restricted. Go to Review Flowsheets to unlock data.  ? ? ? ?

## 2022-02-18 NOTE — Assessment & Plan Note (Signed)
Uncontrolled ?Add maxzide 25 mg and re eval ?DASH diet and commitment to daily physical activity for a minimum of 30 minutes discussed and encouraged, as a part of hypertension management. ?The importance of attaining a healthy weight is also discussed. ? ? ?  02/16/2022  ? 11:14 AM 02/16/2022  ? 10:47 AM 10/18/2021  ? 11:01 AM 07/25/2021  ? 11:19 AM 04/21/2021  ? 11:10 AM 01/17/2021  ?  9:51 AM 12/30/2020  ? 10:14 AM  ?BP/Weight  ?Systolic BP 916 384   665  153  ?Diastolic BP 92 91   76  83  ?Wt. (Lbs)  279   277  277  ?BMI  46.43 kg/m2   46.1 kg/m2  46.1 kg/m2  ?  ? Information is confidential and restricted. Go to Review Flowsheets to unlock data.  ? ? ? ? ?

## 2022-02-20 ENCOUNTER — Other Ambulatory Visit: Payer: Self-pay | Admitting: Family Medicine

## 2022-02-22 ENCOUNTER — Ambulatory Visit (HOSPITAL_COMMUNITY): Payer: Medicaid Other

## 2022-02-23 ENCOUNTER — Ambulatory Visit (HOSPITAL_COMMUNITY): Payer: Medicaid Other

## 2022-02-27 ENCOUNTER — Encounter: Payer: Self-pay | Admitting: Psychiatry

## 2022-02-27 ENCOUNTER — Telehealth (INDEPENDENT_AMBULATORY_CARE_PROVIDER_SITE_OTHER): Payer: Medicaid Other | Admitting: Psychiatry

## 2022-02-27 DIAGNOSIS — F411 Generalized anxiety disorder: Secondary | ICD-10-CM | POA: Diagnosis not present

## 2022-02-27 DIAGNOSIS — R4183 Borderline intellectual functioning: Secondary | ICD-10-CM | POA: Diagnosis not present

## 2022-02-27 DIAGNOSIS — F251 Schizoaffective disorder, depressive type: Secondary | ICD-10-CM | POA: Diagnosis not present

## 2022-02-27 MED ORDER — QUETIAPINE FUMARATE ER 50 MG PO TB24: ORAL_TABLET | ORAL | 1 refills | Status: DC

## 2022-02-27 MED ORDER — PAROXETINE HCL 30 MG PO TABS: 30.0000 mg | ORAL_TABLET | Freq: Every day | ORAL | 1 refills | Status: DC

## 2022-02-27 MED ORDER — BUPROPION HCL ER (XL) 300 MG PO TB24: 300.0000 mg | ORAL_TABLET | Freq: Every day | ORAL | 1 refills | Status: DC

## 2022-02-27 NOTE — Progress Notes (Signed)
Virtual Visit via Video Note  I connected with Karen Cox on 02/27/22 at 11:00 AM EDT by a video enabled telemedicine application and verified that I am speaking with the correct person using two identifiers.  Location Provider Location : [facility] Patient Location : Home  Participants: Patient , Provider   I discussed the limitations of evaluation and management by telemedicine and the availability of in person appointments. The patient expressed understanding and agreed to proceed.   I discussed the assessment and treatment plan with the patient. The patient was provided an opportunity to ask questions and all were answered. The patient agreed with the plan and demonstrated an understanding of the instructions.   The patient was advised to call back or seek an in-person evaluation if the symptoms worsen or if the condition fails to improve as anticipated.   [facility] MD OP Progress Note  02/27/2022 2:13 PM Karen Cox  MRN:  [id number]  Chief Complaint:  Chief Complaint  Patient presents with   Follow-up : 61 year old African-American female with history of schizoaffective disorder, anxiety disorder, presented for medication management.   HPI: Karen Cox is a 61 year old African-American female, lives in [place], has a history of GAD, schizoaffective disorder, borderline intellectual functioning, morbid obesity, seasonal allergies, insomnia was evaluated by telemedicine today.  Patient today reports she is currently doing fairly well on her medications.  Denies any significant sadness or anxiety.  Reports sleep and appetite is good.  Patient continues to have short-term memory loss, however was able to answer questions appropriately.  Reports she has been functioning okay at home, taking care of her household chores.  Patient denies any side effects to medications.    Patient denies any suicidality, homicidality or perceptual disturbances.  Patient denies any  other concerns today.  Visit Diagnosis:    ICD-10-CM   1. Schizoaffective disorder, depressive type ([place])  F25.1 QUEtiapine (SEROQUEL XR) 50 MG TB24 24 hr tablet    PARoxetine (PAXIL) 30 MG tablet    buPROPion (WELLBUTRIN XL) 300 MG 24 hr tablet    2. GAD (generalized anxiety disorder)  F41.1 PARoxetine (PAXIL) 30 MG tablet    buPROPion (WELLBUTRIN XL) 300 MG 24 hr tablet    3. Borderline intellectual functioning  R41.83       Past Psychiatric History: Reviewed past psychiatric history from progress note on 12/09/2019.  Past trials of BuSpar, hydroxyzine, melatonin, Paxil, Wellbutrin.  Past Medical History:  Past Medical History:  Diagnosis Date   Allergy    Anxiety    Cervical high risk HPV (human papillomavirus) test positive 05/17/2014   Depression    Eczema    GERD (gastroesophageal reflux disease)    Hot flashes 09/01/2013   Hyperlipidemia    Mild mental slowing    Peri-menopause 08/05/2013   PMB (postmenopausal bleeding) 11/16/2013   Urticaria    Vaginal itching 08/05/2013   Yeast infection 08/05/2013    Past Surgical History:  Procedure Laterality Date   CERVICAL POLYPECTOMY N/A 01/06/2014   Procedure: CERVICAL POLYPECTOMY;  Surgeon: Florian Buff, MD;  Location: [facility];  Service: Gynecology;  Laterality: N/A;   COLONOSCOPY  09/19/2012   3 mm sessile polyp removed from the colon (simple adenoma).  Advised 10-year follow-up colonoscopy   DILITATION & CURRETTAGE/HYSTROSCOPY WITH THERMACHOICE ABLATION N/A 01/06/2014   Procedure: DILATATION & CURETTAGE/HYSTEROSCOPY WITH THERMACHOICE ABLATION;  Surgeon: Florian Buff, MD;  Location: [facility];  Service: Gynecology;  Laterality: N/A;  total therapy time:  9 minutes  12 seconds ,     temperature:87 degrees    Family Psychiatric History: Reviewed family psychiatric history from progress note on 12/09/2019.  Family History:  Family History  Problem Relation Age of Onset   Diabetes Father    Heart disease Father    Hypertension  Father    Hyperlipidemia Father    Diabetes Maternal Uncle    Anemia Maternal Uncle    Arthritis Maternal Uncle    Anxiety disorder Maternal Uncle    Arthritis Maternal Grandmother    Anxiety disorder Maternal Grandmother    Heart disease Maternal Grandfather        pacemaker   Anxiety disorder Daughter    Colon cancer Neg Hx    Cancer Neg Hx    Allergic rhinitis Neg Hx    Angioedema Neg Hx    Asthma Neg Hx    Eczema Neg Hx    Immunodeficiency Neg Hx    Urticaria Neg Hx    Atopy Neg Hx     Social History: Reviewed social history from progress note on 12/09/2019. Social History   Socioeconomic History   Marital status: Single    Spouse name: Not on file   Number of children: 1   Years of education: Not on file   Highest education level: High school graduate  Occupational History   Not on file  Tobacco Use   Smoking status: Former    Packs/day: 0.25    Years: 3.00    Pack years: 0.75    Types: Cigarettes    Quit date: 09/03/1981    Years since quitting: 40.5   Smokeless tobacco: Never  Vaping Use   Vaping Use: Never used  Substance and Sexual Activity   Alcohol use: No   Drug use: No   Sexual activity: Never    Birth control/protection: Post-menopausal  Other Topics Concern   Not on file  Social History Narrative   Not on file   Social Determinants of Health   Financial Resource Strain: Not on file  Food Insecurity: Not on file  Transportation Needs: Not on file  Physical Activity: Not on file  Stress: Not on file  Social Connections: Not on file    Allergies:  Allergies  Allergen Reactions   Benadryl  [Diphenhydramine Hcl (Sleep)]    Benadryl [Diphenhydramine Hcl] Other (See Comments)    Makes congestion worst    Dextromethorphan Hbr Hives   Robitussin (Alcohol Free) [Guaifenesin]     Makes cough worse   Aspirin Rash and Hives    rash   Diphenhydramine Hcl Hives and Rash    Makes congestion worst    Latex Rash    Metabolic Disorder  Labs: Lab Results  Component Value Date   HGBA1C 5.7 (H) 02/16/2022   MPG 114 07/25/2021   MPG 94 08/18/2013   Lab Results  Component Value Date   PROLACTIN 18.6 07/25/2021   Lab Results  Component Value Date   CHOL 193 02/16/2022   TRIG 147 02/16/2022   HDL 37 (L) 02/16/2022   CHOLHDL 5.2 (H) 02/16/2022   VLDL 20 08/17/2016   LDLCALC 129 (H) 02/16/2022   LDLCALC 130 (H) 04/21/2021   Lab Results  Component Value Date   TSH 2.346 07/25/2021   TSH 1.83 03/18/2020    Therapeutic Level Labs: No results found for: LITHIUM No results found for: VALPROATE No components found for:  CBMZ  Current Medications: Current Outpatient Medications  Medication Sig Dispense Refill   buPROPion (WELLBUTRIN XL)  300 MG 24 hr tablet Take 1 tablet (300 mg total) by mouth daily. 90 tablet 1   PARoxetine (PAXIL) 30 MG tablet Take 1 tablet (30 mg total) by mouth daily. 90 tablet 1   Blood Pressure Monitoring (BLOOD PRESSURE MONITOR AUTOMAT) DEVI 1 each by Does not apply route daily. 1 each 0   cephALEXin (KEFLEX) 500 MG capsule Take 1 capsule (500 mg total) by mouth 2 (two) times daily. 10 capsule 0   conjugated estrogens (PREMARIN) vaginal cream Use a small amount of cream 3-4x per week     ESTRING 2 MG vaginal ring      medroxyPROGESTERone (PROVERA) 5 MG tablet Take 5 mg by mouth daily.     Multiple Vitamins-Minerals (HAIR SKIN NAILS PO) Take by mouth daily.     pantoprazole (PROTONIX) 40 MG tablet TAKE 1 TABLET BY MOUTH TWICE A DAY BEFORE A MEAL. 60 tablet 0   propranolol ER (INDERAL LA) 60 MG 24 hr capsule Take 1 capsule (60 mg total) by mouth daily. 180 capsule 0   QUEtiapine (SEROQUEL XR) 50 MG TB24 24 hr tablet TAKE (2) TABLETS BY MOUTH AT BEDTIME. 180 tablet 1   triamterene-hydrochlorothiazide (MAXZIDE-25) 37.5-25 MG tablet Take 1 tablet by mouth daily. 30 tablet 3   Vitamin D, Ergocalciferol, (DRISDOL) 1.25 MG (50000 UNIT) CAPS capsule TAKE 1 CAPSULE BY MOUTH ONCE A WEEK. (Patient not  taking: Reported on 02/16/2022) 12 capsule 0   Current Facility-Administered Medications  Medication Dose Route Frequency Provider Last Rate Last Admin   omalizumab Arvid Right) injection 300 mg  300 mg Subcutaneous Q28 days Valentina Shaggy, MD   300 mg at 12/31/17 1035     Musculoskeletal: Strength & Muscle Tone:  UTA Gait & Station:  Seated Patient leans: N/A  Psychiatric Specialty Exam: Review of Systems  Psychiatric/Behavioral:  Positive for hallucinations. Negative for agitation, behavioral problems, confusion, decreased concentration, dysphoric mood, self-injury, sleep disturbance and suicidal ideas. The patient is not nervous/anxious and is not hyperactive.   All other systems reviewed and are negative.  Last menstrual period 06/27/2013.There is no height or weight on file to calculate BMI.  General Appearance: Casual  Eye Contact:  Fair  Speech:  Normal Rate  Volume:  Normal  Mood:  Euthymic  Affect:  Congruent  Thought Process:  Goal Directed and Descriptions of Associations: Intact  Orientation:  Full (Time, Place, and Person)  Thought Content:  AH of some one calling her name - does not bother her    Suicidal Thoughts:  No  Homicidal Thoughts:  No  Memory:  Immediate;   Fair Recent;   Fair Remote;   limited  Judgement:  Fair  Insight:  Fair  Psychomotor Activity:  Normal  Concentration:  Concentration: Fair and Attention Span: Fair  Recall:  [employer] of Knowledge: Fair  Language: Fair  Akathisia:  No  Handed:  Right  AIMS (if indicated): done  Assets:  Communication Skills Desire for Improvement Housing Social Support  ADL's:  Intact  Cognition: baseline  Sleep:  Fair   Screenings: [place] Video Visit from 02/27/2022 in [facility] Total Score 0      Echo Office Visit from 10/13/2019 in [facility] Office Visit from 09/08/2019 in [facility]  Total GAD-7  Score 7 15      PHQ2-9    Flowsheet Row Video Visit from 02/27/2022 in [facility] Office Visit from 02/16/2022 in [facility] Office Visit from 10/18/2021 in [facility] from 08/10/2021 in [facility] Office Visit from 07/25/2021 in [facility]  PHQ-2 Total Score 0 '3 4 2 1  '$ PHQ-9 Total Score -- '9 8 6 '$ --      Flowsheet Row Video Visit from 02/27/2022 in [facility] Office Visit from 02/16/2022 in [facility] from 08/10/2021 in [facility] No Risk Error: Q3, 4, or 5 should not be populated when Q2 is No No Risk        Assessment and Plan: VICTORY DRESDEN is a 61 year old African-American female who lives in [place], has a history of GAD, schizoaffective disorder, borderline intellectual functioning, morbid obesity was evaluated by telemedicine today.  Patient is currently stable.  Plan Schizoaffective disorder-stable Wellbutrin 300 mg p.o. daily Paxil 30 mg p.o. daily Seroquel extended release 100 mg p.o. nightly  GAD-stable Paxil 30 mg p.o. daily Hydroxyzine 25 mg p.o. 3 times daily as needed for severe anxiety   Follow-up in clinic in 3 months or sooner if needed.  Consent: Patient/Guardian gives verbal consent for treatment and assignment of benefits for services provided during this visit. Patient/Guardian expressed understanding and agreed to proceed.   This note was generated in part or whole with voice recognition software. Voice recognition is usually quite accurate but there are transcription errors that can and very often do occur. I apologize for any typographical errors that were not detected and corrected.      Ursula Alert, MD 02/27/2022, 2:13 PM

## 2022-03-06 ENCOUNTER — Ambulatory Visit: Payer: Medicaid Other | Admitting: Gastroenterology

## 2022-03-13 ENCOUNTER — Ambulatory Visit (INDEPENDENT_AMBULATORY_CARE_PROVIDER_SITE_OTHER): Payer: Medicaid Other | Admitting: Family Medicine

## 2022-03-13 ENCOUNTER — Encounter: Payer: Self-pay | Admitting: Family Medicine

## 2022-03-13 DIAGNOSIS — R058 Other specified cough: Secondary | ICD-10-CM

## 2022-03-13 MED ORDER — MONTELUKAST SODIUM 10 MG PO TABS: 10.0000 mg | ORAL_TABLET | Freq: Every day | ORAL | 3 refills | Status: DC

## 2022-03-13 NOTE — Patient Instructions (Signed)
F/u as before, call if you need me sooner  Singulair/ Montelukast is prescribed for uncontrolled allergy symptom causing dry cough  It is important that you exercise regularly at least 30 minutes 5 times a week. If you develop chest pain, have severe difficulty breathing, or feel very tired, stop exercising immediately and seek medical attention  Think about what you will eat, plan ahead. Choose " clean, green, fresh or frozen" over canned, processed or packaged foods which are more sugary, salty and fatty. 70 to 75% of food eaten should be vegetables and fruit. Three meals at set times with snacks allowed between meals, but they must be fruit or vegetables. Aim to eat over a 12 hour period , example 7 am to 7 pm, and STOP after  your last meal of the day. Drink water,generally about 64 ounces per day, no other drink is as healthy. Fruit juice is best enjoyed in a healthy way, by EATING the fruit. Thanks for choosing [facility], we consider it a privelige to serve you.

## 2022-03-13 NOTE — Assessment & Plan Note (Signed)
Uncontrolled x 1 week, start singulair daily

## 2022-03-13 NOTE — Progress Notes (Signed)
Virtual Visit via Telephone Note  I connected with Karen Cox on 03/13/22 at 11:40 AM EDT by telephone and verified that I am speaking with the correct person using two identifiers.  Location: Patient: home Provider: office   I discussed the limitations, risks, security and privacy concerns of performing an evaluation and management service by telephone and the availability of in person appointments. I also discussed with the patient that there may be a patient responsible charge related to this service. The patient expressed understanding and agreed to proceed.   History of Present Illness: Choking/ cough which is dry x 1 week. No sputum, no fever or chills. Mainly in evening and at night    Observations/Objective: LMP 06/27/2013  Good communication with no confusion and intact memory. Alert and oriented x 3 No signs of respiratory distress during speech   Assessment and Plan:  Allergic cough Uncontrolled x 1 week, start singulair daily  Follow Up Instructions:    I discussed the assessment and treatment plan with the patient. The patient was provided an opportunity to ask questions and all were answered. The patient agreed with the plan and demonstrated an understanding of the instructions.   The patient was advised to call back or seek an in-person evaluation if the symptoms worsen or if the condition fails to improve as anticipated.  I provided 8 minutes of non-face-to-face time during this encounter.   [name], MD

## 2022-03-27 ENCOUNTER — Ambulatory Visit: Payer: Medicaid Other | Admitting: Gastroenterology

## 2022-04-06 ENCOUNTER — Other Ambulatory Visit: Payer: Self-pay | Admitting: Internal Medicine

## 2022-04-16 ENCOUNTER — Ambulatory Visit (HOSPITAL_COMMUNITY): Payer: Medicaid Other

## 2022-04-17 ENCOUNTER — Ambulatory Visit: Payer: Medicaid Other | Admitting: Family Medicine

## 2022-04-17 NOTE — Progress Notes (Unsigned)
GI Office Note    Referring Provider: Fayrene Helper, MD Primary Care Physician:  Fayrene Helper, MD Primary GI: Dr. Abbey Chatters  Date:  04/18/2022  ID:  Karen Cox, DOB 1960-11-10, MRN [id number]   Chief Complaint   Chief Complaint  Patient presents with   Follow-up    Reflux comes up in her throat at night.     History of Present Illness  Karen Cox is a 61 y.o. female with a history of Anxiety, depression, GERD, and HLD presenting today for follow up.   Last seen for consultation of GERD March 2022.  She is complaining of burning back of her throat for about a year, she had tried multiple medications without relief.  Initially felt Pepcid was helpful seemed like it stopped after a while.  Reported a 70 pound weight gain in a year which she attributed to poor dietary choices.  She says her symptoms usually occurred in the evenings after lying down.  She will gargle Listerine or juice vomiting to have relief.  She also says she felt like food was sticking in her upper chest sometimes needing to regurgitate.  She was started on pantoprazole twice daily before meals and encouraged antireflux measures.  Was also advised weight loss to help improve symptoms.  She was advised to follow-up in 6 weeks with Dr. Abbey Chatters if not significantly improved.  She is also recommended to have endoscopy if not improved.  Last colonoscopy in 2013 -simple adenoma removed.  Due for repeat this year.   Today: Is taking the pantoprazole 40 mg, 2 tablets in the morning. She states it was not working well taking one in the morning and one at night. Sometimes it wakes her up at night with the burning and does have some regurgitation a times. She states it feels like flames in her throat. Uses ice or ice water to help soothe her throat. Until she gets relief she can not go back to sleep. This has been going on for a while. Denies dysphagia.   Has a bowel movement about one to two a week.  Does have to strain. Has sensation at times where she feels like she needs to go but then can't. Used to take some pepto bismol to see if that would help. Stools are mostly firm.   Denies melena or hematochezia. Denies shortness of breath or chest pain. Denies lack of appetite. Has lost some weight recently, has been walking.     Past Medical History:  Diagnosis Date   Allergy    Anxiety    Cervical high risk HPV (human papillomavirus) test positive 05/17/2014   Depression    Eczema    GERD (gastroesophageal reflux disease)    Hot flashes 09/01/2013   Hyperlipidemia    Mild mental slowing    Peri-menopause 08/05/2013   PMB (postmenopausal bleeding) 11/16/2013   Urticaria    Vaginal itching 08/05/2013   Yeast infection 08/05/2013    Past Surgical History:  Procedure Laterality Date   CERVICAL POLYPECTOMY N/A 01/06/2014   Procedure: CERVICAL POLYPECTOMY;  Surgeon: Florian Buff, MD;  Location: [facility];  Service: Gynecology;  Laterality: N/A;   COLONOSCOPY  09/19/2012   3 mm sessile polyp removed from the colon (simple adenoma).  Advised 10-year follow-up colonoscopy   DILITATION & CURRETTAGE/HYSTROSCOPY WITH THERMACHOICE ABLATION N/A 01/06/2014   Procedure: DILATATION & CURETTAGE/HYSTEROSCOPY WITH THERMACHOICE ABLATION;  Surgeon: Florian Buff, MD;  Location: [facility];  Service: Gynecology;  Laterality:  N/A;  total therapy time:  9 minutes 12 seconds ,     temperature:87 degrees    Current Outpatient Medications  Medication Sig Dispense Refill   Biotin 1000 MCG tablet Take 1,000 mcg by mouth daily.     Blood Pressure Monitoring (BLOOD PRESSURE MONITOR AUTOMAT) DEVI 1 each by Does not apply route daily. 1 each 0   buPROPion (WELLBUTRIN XL) 300 MG 24 hr tablet Take 1 tablet (300 mg total) by mouth daily. 90 tablet 1   conjugated estrogens (PREMARIN) vaginal cream Use a small amount of cream 3-4x per week     medroxyPROGESTERone (PROVERA) 5 MG tablet Take 5 mg by mouth daily.      montelukast (SINGULAIR) 10 MG tablet Take 1 tablet (10 mg total) by mouth at bedtime. 30 tablet 3   Omega-3 Fatty Acids (FISH OIL) 600 MG CAPS Take by mouth.     pantoprazole (PROTONIX) 40 MG tablet TAKE 1 TABLET BY MOUTH TWICE A DAY BEFORE A MEAL. 60 tablet 0   PARoxetine (PAXIL) 30 MG tablet Take 1 tablet (30 mg total) by mouth daily. 90 tablet 1   QUEtiapine (SEROQUEL XR) 50 MG TB24 24 hr tablet TAKE (2) TABLETS BY MOUTH AT BEDTIME. 180 tablet 1   triamterene-hydrochlorothiazide (MAXZIDE-25) 37.5-25 MG tablet Take 1 tablet by mouth daily. 30 tablet 3   Vitamin D3 (VITAMIN D) 25 MCG tablet Take 1,000 Units by mouth daily.     Current Facility-Administered Medications  Medication Dose Route Frequency Provider Last Rate Last Admin   omalizumab Arvid Right) injection 300 mg  300 mg Subcutaneous Q28 days Valentina Shaggy, MD   300 mg at 12/31/17 1035    Allergies as of 04/18/2022 - Review Complete 04/18/2022  Allergen Reaction Noted   Benadryl  [diphenhydramine hcl (sleep)]     Benadryl [diphenhydramine hcl] Other (See Comments) 12/31/2010   Dextromethorphan hbr Hives 05/29/2016   Robitussin (alcohol free) [guaifenesin]  05/17/2014   Aspirin Rash and Hives 05/08/2011   Diphenhydramine hcl Hives and Rash 03/01/2016   Latex Rash 09/04/2011    Family History  Problem Relation Age of Onset   Diabetes Father    Heart disease Father    Hypertension Father    Hyperlipidemia Father    Diabetes Maternal Uncle    Anemia Maternal Uncle    Arthritis Maternal Uncle    Anxiety disorder Maternal Uncle    Arthritis Maternal Grandmother    Anxiety disorder Maternal Grandmother    Heart disease Maternal Grandfather        pacemaker   Anxiety disorder Daughter    Colon cancer Neg Hx    Cancer Neg Hx    Allergic rhinitis Neg Hx    Angioedema Neg Hx    Asthma Neg Hx    Eczema Neg Hx    Immunodeficiency Neg Hx    Urticaria Neg Hx    Atopy Neg Hx     Social History   Socioeconomic History    Marital status: Single    Spouse name: Not on file   Number of children: 1   Years of education: Not on file   Highest education level: High school graduate  Occupational History   Not on file  Tobacco Use   Smoking status: Former    Packs/day: 0.25    Years: 3.00    Total pack years: 0.75    Types: Cigarettes    Quit date: 09/03/1981    Years since quitting: 40.6   Smokeless tobacco:  Never  Vaping Use   Vaping Use: Never used  Substance and Sexual Activity   Alcohol use: Yes    Comment: occ   Drug use: No   Sexual activity: Not Currently    Birth control/protection: Post-menopausal  Other Topics Concern   Not on file  Social History Narrative   Not on file   Social Determinants of Health   Financial Resource Strain: Not on file  Food Insecurity: Not on file  Transportation Needs: Not on file  Physical Activity: Not on file  Stress: Not on file  Social Connections: Not on file     Review of Systems   Gen: Denies fever, chills, anorexia. Denies fatigue, weakness, weight loss.  CV: Denies chest pain, palpitations, syncope, peripheral edema, and claudication. Resp: Denies dyspnea at rest, cough, wheezing, coughing up blood, and pleurisy. GI: see HPI Derm: Denies rash, itching, dry skin Psych: Denies depression, anxiety, memory loss, confusion. No homicidal or suicidal ideation.  Heme: Denies bruising, bleeding, and enlarged lymph nodes.   Physical Exam   BP 135/89 (BP Location: Left Arm, Patient Position: Sitting, Cuff Size: Large)   Pulse (!) 109   Temp 97.6 F (36.4 C) (Oral)   Ht '5\' 5"'$  (1.651 m)   Wt 266 lb 9.6 oz (120.9 kg)   LMP 06/27/2013   SpO2 99%   BMI 44.36 kg/m   General:   Alert and oriented. No distress noted. Pleasant and cooperative.  Head:  Normocephalic and atraumatic. Eyes:  Conjuctiva clear without scleral icterus. Mouth:  Oral mucosa pink and moist. Good dentition. No lesions. Lungs:  Clear to auscultation bilaterally. No  wheezes, rales, or rhonchi. No distress.  Heart:  S1, S2 present without murmurs appreciated.  Abdomen: Obese,+BS, soft, non-tender and non-distended. No rebound or guarding. No HSM or masses noted. Rectal: deferred Msk:  Symmetrical without gross deformities. Normal posture. Extremities:  Without edema. Neurologic:  Alert and  oriented x4 Psych:  Alert and cooperative.  Anxious.    Assessment  HYLA COARD is a 61 y.o. female with a history of Anxiety, depression, GERD, and HLD presenting today for follow up.  GERD: Patient has pantoprazole 40 mg twice daily prescribed to her.  She states that when she was taking it as prescribed she felt like it was not helping her so she has been taking 2 tablets in the morning.  She continues to have nocturnal symptoms of burning in her throat with intermittent regurgitation.  Patient stated felt like flames are in her throat.  We will have to get up and drink ice water to help soothe her throat and not able to go back to sleep until she has relief.  She denies any dysphagia, denies headache, early satiety.  She has also weight recently but that is intentional and she has been increasing her exercise.  I have recommended for her to take pantoprazole as prescribed to her once in the morning and once in the evening prior to dinner.  I have advised her to begin taking famotidine 20 mg nightly prior to going to bed.  GERD diet and lifestyle modifications reinforced and separate handout provided.  Given no improvement in symptoms this far we will proceed with upper endoscopy to further evaluate.  Constipation: Patient reports only having about 1-2 bowel movements a week.  Does have to strain frequently as stools are very firm.[place] like she needs to go but then cannot go when she sits down.  States she used to  take Pepto-Bismol to help her go to the bathroom.  She denies any melena or hematochezia.  I advised her to stop taking Pepto-Bismol for constipation  and have advised her to take Miralax 1 capful nightly.  Also encouraged high-fiber diet, examples of high fiber containing foods provided.  Continue exercise and weight loss efforts.  History of colon polyps: Last colonoscopy in 2013 with 1 simple adenoma removed.  Due this year for colonoscopy.  No alarm symptoms present.  Recent weight loss has been intentional.  We will proceed with colonoscopy with propofol in the near future with Dr. Abbey Chatters.   PLAN   Proceed with EGD and colonoscopy with propofol by Dr. Abbey Chatters  in near future: the risks, benefits, and alternatives have been discussed with the patient in detail. The patient states understanding and desires to proceed. ASA 3 Pantoprazole 40 mg BID Famotidine 20 mg nightly. Miralax 1 capful nightly Follow high-fiber diet GERD diet/active modifications reinforced, handout provided Continue exercise and weight loss efforts Stop Pepto-Bismol Follow up 2 months post procedure.    Venetia Night, MSN, FNP-BC, AGACNP-BC [facility]

## 2022-04-18 ENCOUNTER — Telehealth: Payer: Self-pay | Admitting: *Deleted

## 2022-04-18 ENCOUNTER — Ambulatory Visit (INDEPENDENT_AMBULATORY_CARE_PROVIDER_SITE_OTHER): Payer: Medicaid Other | Admitting: Gastroenterology

## 2022-04-18 ENCOUNTER — Encounter: Payer: Self-pay | Admitting: Gastroenterology

## 2022-04-18 VITALS — BP 135/89 | HR 109 | Temp 97.6°F | Ht 65.0 in | Wt 266.6 lb

## 2022-04-18 DIAGNOSIS — K219 Gastro-esophageal reflux disease without esophagitis: Secondary | ICD-10-CM | POA: Diagnosis not present

## 2022-04-18 DIAGNOSIS — K5900 Constipation, unspecified: Secondary | ICD-10-CM | POA: Diagnosis not present

## 2022-04-18 DIAGNOSIS — Z86010 Personal history of colonic polyps: Secondary | ICD-10-CM | POA: Diagnosis not present

## 2022-04-18 MED ORDER — FAMOTIDINE 20 MG PO TABS: 20.0000 mg | ORAL_TABLET | Freq: Every day | ORAL | 3 refills | Status: DC

## 2022-04-18 MED ORDER — PANTOPRAZOLE SODIUM 40 MG PO TBEC: DELAYED_RELEASE_TABLET | ORAL | 0 refills | Status: DC

## 2022-04-18 NOTE — Telephone Encounter (Signed)
Patient does not want to have procedures done until Oct/Nov. Spoke with [name] and she stated to schedule patient a virtual visit late September early october have her do a virtual visit just to make sure no new issues have come up.  Please schedule Karen Cox thanks!

## 2022-04-18 NOTE — Patient Instructions (Signed)
We will schedule you for an upper endoscopy and colonoscopy with Dr. Abbey Chatters in the near future.  For your reflux I want you to continue taking her pantoprazole but I want you to take it once in the morning before breakfast and once in the evening before dinner.  I am also sending in famotidine for you to take 20 mg nightly prior to going to bed.  Please continue to follow GERD diet: Avoid caffeine, chocolate, spicy, fatty/greasy foods. I want you to sleep with your head propped up at night. Avoid increased dairy products   For constipation when she did begin taking MiraLAX 17 g (1 capful) nightly.  Also when she did follow a high-fiber diet.  I am attaching a handout regarding foods that you should eat.  It was a pleasure to see you today. I want to create trusting relationships with patients. If you receive a survey regarding your visit,  I greatly appreciate you taking time to fill this out on paper or through your MyChart. I value your feedback.  Venetia Night, MSN, FNP-BC, AGACNP-BC [facility]

## 2022-04-25 ENCOUNTER — Ambulatory Visit (HOSPITAL_COMMUNITY): Payer: Medicaid Other

## 2022-05-03 ENCOUNTER — Telehealth: Payer: Self-pay | Admitting: *Deleted

## 2022-05-03 NOTE — Telephone Encounter (Signed)
Informed pt of recommendations. Pt voiced understanding.

## 2022-05-03 NOTE — Telephone Encounter (Signed)
Spoke to pt, she is wondering if it is ok to drink energy drinks  Monster and Powerade.

## 2022-05-10 ENCOUNTER — Telehealth: Payer: Self-pay | Admitting: Internal Medicine

## 2022-05-10 ENCOUNTER — Other Ambulatory Visit: Payer: Self-pay | Admitting: Gastroenterology

## 2022-05-10 DIAGNOSIS — K219 Gastro-esophageal reflux disease without esophagitis: Secondary | ICD-10-CM

## 2022-05-10 MED ORDER — PANTOPRAZOLE SODIUM 40 MG PO TBEC: DELAYED_RELEASE_TABLET | ORAL | 5 refills | Status: DC

## 2022-05-10 NOTE — Telephone Encounter (Signed)
Pt needs refill on her pantoprazole and uses [facility]. [phone]

## 2022-05-10 NOTE — Telephone Encounter (Signed)
Spoke to pt, she needs pantoprazole sent to [facility].

## 2022-05-11 NOTE — Telephone Encounter (Signed)
Noted. Informed pt.  

## 2022-05-16 ENCOUNTER — Ambulatory Visit (HOSPITAL_COMMUNITY): Payer: Medicaid Other

## 2022-05-22 ENCOUNTER — Other Ambulatory Visit: Payer: Self-pay | Admitting: Family Medicine

## 2022-05-28 ENCOUNTER — Telehealth: Payer: Self-pay | Admitting: Psychiatry

## 2022-05-28 NOTE — Telephone Encounter (Signed)
Patient called to reschedule appt 05-29-22 to Sept. 19. But until her appointment wants to know if you will refill her Paroxetine 30 mg and Seroquel 50 mg and Wellbutrin 300 mg. Please advise.

## 2022-05-28 NOTE — Telephone Encounter (Signed)
Patient was provided 90 days supply with 1 refill for Paxil, Seroquel and Wellbutrin on Feb 27, 2022.  Patient needs to contact her pharmacy for refill since she does have a refill available.  Will have Janett Billow CMA let patient know.

## 2022-05-29 ENCOUNTER — Ambulatory Visit: Payer: Medicaid Other | Admitting: Psychiatry

## 2022-05-29 ENCOUNTER — Ambulatory Visit: Payer: Medicaid Other | Admitting: Internal Medicine

## 2022-05-29 NOTE — Telephone Encounter (Signed)
spoke with patinet she is going to check with the pharmacy.

## 2022-05-31 ENCOUNTER — Ambulatory Visit: Payer: Medicaid Other | Admitting: Internal Medicine

## 2022-06-01 ENCOUNTER — Ambulatory Visit: Payer: Medicaid Other | Admitting: Family Medicine

## 2022-06-01 ENCOUNTER — Ambulatory Visit (INDEPENDENT_AMBULATORY_CARE_PROVIDER_SITE_OTHER): Payer: Medicaid Other | Admitting: Family Medicine

## 2022-06-01 ENCOUNTER — Encounter: Payer: Self-pay | Admitting: Family Medicine

## 2022-06-01 ENCOUNTER — Telehealth: Payer: Self-pay | Admitting: Family Medicine

## 2022-06-01 VITALS — BP 134/90 | HR 111 | Ht 65.0 in | Wt 263.1 lb

## 2022-06-01 DIAGNOSIS — Z23 Encounter for immunization: Secondary | ICD-10-CM | POA: Diagnosis not present

## 2022-06-01 DIAGNOSIS — I10 Essential (primary) hypertension: Secondary | ICD-10-CM

## 2022-06-01 DIAGNOSIS — R002 Palpitations: Secondary | ICD-10-CM | POA: Diagnosis not present

## 2022-06-01 DIAGNOSIS — F411 Generalized anxiety disorder: Secondary | ICD-10-CM

## 2022-06-01 DIAGNOSIS — F321 Major depressive disorder, single episode, moderate: Secondary | ICD-10-CM

## 2022-06-01 MED ORDER — TRIAMTERENE-HCTZ 37.5-25 MG PO TABS: 1.0000 | ORAL_TABLET | Freq: Every day | ORAL | 3 refills | Status: DC

## 2022-06-01 MED ORDER — AMLODIPINE BESYLATE 2.5 MG PO TABS: 2.5000 mg | ORAL_TABLET | Freq: Every day | ORAL | 3 refills | Status: DC

## 2022-06-01 NOTE — Patient Instructions (Addendum)
F/U in 3 months, call if you need me sooner  EKG today   New additional medication for blood pressure is amlodipine 2.5 mg daily  Continue triamterene one daily as before   Flu vaccine and TdAP today  You may eat 4 lemons per week  Please have pt update her DPR at checkout  Thanks for choosing [facility], we consider it a privelige to serve you.

## 2022-06-01 NOTE — Telephone Encounter (Signed)
Patient called in wants a call back in regard to if light beer is okay to drink

## 2022-06-04 ENCOUNTER — Encounter: Payer: Self-pay | Admitting: Family Medicine

## 2022-06-04 DIAGNOSIS — R002 Palpitations: Secondary | ICD-10-CM | POA: Insufficient documentation

## 2022-06-04 NOTE — Telephone Encounter (Signed)
lmtrc

## 2022-06-04 NOTE — Progress Notes (Signed)
Karen Cox     MRN: [id number]      DOB: Sep 20, 1961   HPI Karen Cox is here for follow up and re-evaluation of chronic medical conditions, medication management and review of any available recent lab and radiology data.  Preventive health is updated, specifically  Cancer screening and Immunization.   Questions or concerns regarding consultations or procedures which the PT has had in the interim are  addressed. The PT denies any adverse reactions to current medications since the last visit.  C/o irregular heart beat andd fluctuauting blood pressure, has log of her home records, was on propranoilol in the past  ROS Denies recent fever or chills. Denies sinus pressure, nasal congestion, ear pain or sore throat. Denies chest congestion, productive cough or wheezing. Denies chest pains, PND , orthopneaand leg swelling Denies abdominal pain, nausea, vomiting,diarrhea or constipation.   Denies dysuria, frequency, hesitancy or incontinence. Denies joint pain, swelling and limitation in mobility. Denies headaches, seizures, numbness, or tingling. Denies uncontrolled depression, anxiety or insomnia. Denies skin break down or rash.   PE  BP (!) 134/90   Pulse (!) 111   Ht '5\' 5"'$  (1.651 m)   Wt 263 lb 1.9 oz (119.4 kg)   LMP 06/27/2013   SpO2 97%   BMI 43.79 kg/m   Patient alert and oriented and in no cardiopulmonary distress.  HEENT: No facial asymmetry, EOMI,     Neck supple .  Chest: Clear to auscultation bilaterally.  CVS: S1, S2 no murmurs, no S3.Regular rate.  ABD: Soft non tender.   Ext: No edema  MS: Adequate ROM spine, shoulders, hips and knees.  Skin: Intact, no ulcerations or rash noted.  Psych: Good eye contact, normal affect. Memory intact not anxious or depressed appearing.  CNS: CN 2-12 intact, power,  normal throughout.no focal deficits noted.   Assessment & Plan  Palpitations Sinus tach, rate 104, no lVH , no acute ischemia, non specific  t wave abnormalities  Essential hypertension Uncontrolled Add amlodipin 2.5 mg daily DASH diet and commitment to daily physical activity for a minimum of 30 minutes discussed and encouraged, as a part of hypertension management. The importance of attaining a healthy weight is also discussed.     06/01/2022    1:27 PM 06/01/2022    1:00 PM 04/18/2022    8:33 AM 02/16/2022   11:14 AM 02/16/2022   10:47 AM 10/18/2021   11:01 AM 07/25/2021   11:19 AM  BP/Weight  Systolic BP 333 832 919 166 060    Diastolic BP 90 88 89 92 91    Wt. (Lbs)  263.12 266.6  279    BMI  43.79 kg/m2 44.36 kg/m2  46.43 kg/m2       Information is confidential and restricted. Go to Review Flowsheets to unlock data.       Morbid obesity ([place])  Patient re-educated about  the importance of commitment to a  minimum of 150 minutes of exercise per week as able.  The importance of healthy food choices with portion control discussed, as well as eating regularly and within a 12 hour window most days. The need to choose "clean , green" food 50 to 75% of the time is discussed, as well as to make water the primary drink and set a goal of 64 ounces water daily.       06/01/2022    1:00 PM 04/18/2022    8:33 AM 02/16/2022   10:47 AM  Weight /BMI  Weight  263 lb 1.9 oz 266 lb 9.6 oz 279 lb  Height '5\' 5"'$  (1.651 m) '5\' 5"'$  (1.651 m) '5\' 5"'$  (1.651 m)  BMI 43.79 kg/m2 44.36 kg/m2 46.43 kg/m2    , she is applauded on thisimproved  GAD (generalized anxiety disorder) Managed by psych,  stable  Depression, major, single episode, moderate ([place]) Managed by Psych, stable

## 2022-06-04 NOTE — Assessment & Plan Note (Signed)
Uncontrolled Add amlodipin 2.5 mg daily DASH diet and commitment to daily physical activity for a minimum of 30 minutes discussed and encouraged, as a part of hypertension management. The importance of attaining a healthy weight is also discussed.     06/01/2022    1:27 PM 06/01/2022    1:00 PM 04/18/2022    8:33 AM 02/16/2022   11:14 AM 02/16/2022   10:47 AM 10/18/2021   11:01 AM 07/25/2021   11:19 AM  BP/Weight  Systolic BP 694 503 888 280 034    Diastolic BP 90 88 89 92 91    Wt. (Lbs)  263.12 266.6  279    BMI  43.79 kg/m2 44.36 kg/m2  46.43 kg/m2       Information is confidential and restricted. Go to Review Flowsheets to unlock data.

## 2022-06-04 NOTE — Assessment & Plan Note (Signed)
Sinus tach, rate 104, no lVH , no acute ischemia, non specific t wave abnormalities

## 2022-06-04 NOTE — Assessment & Plan Note (Signed)
Managed by Psych, stable 

## 2022-06-04 NOTE — Assessment & Plan Note (Signed)
Managed by psych,  stable

## 2022-06-04 NOTE — Assessment & Plan Note (Signed)
  Patient re-educated about  the importance of commitment to a  minimum of 150 minutes of exercise per week as able.  The importance of healthy food choices with portion control discussed, as well as eating regularly and within a 12 hour window most days. The need to choose "clean , green" food 50 to 75% of the time is discussed, as well as to make water the primary drink and set a goal of 64 ounces water daily.       06/01/2022    1:00 PM 04/18/2022    8:33 AM 02/16/2022   10:47 AM  Weight /BMI  Weight 263 lb 1.9 oz 266 lb 9.6 oz 279 lb  Height '5\' 5"'$  (1.651 m) '5\' 5"'$  (1.651 m) '5\' 5"'$  (1.651 m)  BMI 43.79 kg/m2 44.36 kg/m2 46.43 kg/m2    , she is applauded on thisimproved

## 2022-06-05 ENCOUNTER — Telehealth: Payer: Self-pay | Admitting: Family Medicine

## 2022-06-05 NOTE — Telephone Encounter (Signed)
I don't see where you stopped her inderal. It looks like the GI office deleted it off her med list back on 04/18/22 due to pt stating she was not taking. PLs advise before I call daughter back

## 2022-06-05 NOTE — Telephone Encounter (Signed)
Information mailed out

## 2022-06-05 NOTE — Telephone Encounter (Signed)
Patient called in requesting a diet sheet   Patient wants to know what she can and cannot eat and what she can and cannot drink.  Would like list mailed to home.

## 2022-06-05 NOTE — Telephone Encounter (Signed)
Daughter  Caryl Pina called in requesting a call back.  Wants to speak with provider in regard to patients med change. Patient heart rate has been up since patient has stopped taking med   propranolol (INDERAL) 40 MG tablet [

## 2022-06-06 ENCOUNTER — Telehealth: Payer: Self-pay | Admitting: Family Medicine

## 2022-06-06 ENCOUNTER — Other Ambulatory Visit: Payer: Self-pay | Admitting: Family Medicine

## 2022-06-06 MED ORDER — PROPRANOLOL HCL ER 60 MG PO CP24: 60.0000 mg | ORAL_CAPSULE | Freq: Every day | ORAL | 1 refills | Status: DC

## 2022-06-06 NOTE — Telephone Encounter (Signed)
Daughter is very concerned because her mom is still c/o her pulse is still beating really high over 100 and the daughter was upset about the propanolol and wants her to go back on it. She is asking if you will send it back in since it was taken off her list by accident and wants to know if she needs to stop one of the bp meds once she goes back on the propanolol. Please advise.  Also wants to know how much weight u recommend she lose so she can help her mother with this

## 2022-06-06 NOTE — Telephone Encounter (Signed)
Pt daughter called wanting to know if she could please get a call back from her message from yesterday ?   [phone]

## 2022-06-06 NOTE — Telephone Encounter (Signed)
See previous message

## 2022-06-06 NOTE — Telephone Encounter (Signed)
Daughter aware of message. (Pls schedule 1-1.5 month follow up) I tried to scheduled but didn't see anything open for the morning

## 2022-06-19 ENCOUNTER — Ambulatory Visit: Payer: Medicaid Other | Admitting: Family Medicine

## 2022-06-22 ENCOUNTER — Telehealth: Payer: Self-pay

## 2022-06-22 ENCOUNTER — Other Ambulatory Visit: Payer: Self-pay

## 2022-06-22 MED ORDER — PROPRANOLOL HCL ER 60 MG PO CP24: 60.0000 mg | ORAL_CAPSULE | Freq: Every day | ORAL | 1 refills | Status: DC

## 2022-06-22 MED ORDER — TRIAMTERENE-HCTZ 37.5-25 MG PO TABS: 1.0000 | ORAL_TABLET | Freq: Every day | ORAL | 3 refills | Status: DC

## 2022-06-22 NOTE — Telephone Encounter (Signed)
Patient called need med refill  triamterene-hydrochlorothiazide (MAXZIDE-25) 37.5-25 MG tablet   propranolol ER (INDERAL LA) 60 MG 24 hr capsule   Pharmacy: [facility]

## 2022-06-22 NOTE — Telephone Encounter (Signed)
Refills sent

## 2022-06-23 ENCOUNTER — Other Ambulatory Visit: Payer: Self-pay | Admitting: Psychiatry

## 2022-06-23 DIAGNOSIS — F251 Schizoaffective disorder, depressive type: Secondary | ICD-10-CM

## 2022-06-25 ENCOUNTER — Encounter: Payer: Self-pay | Admitting: *Deleted

## 2022-06-26 ENCOUNTER — Ambulatory Visit (INDEPENDENT_AMBULATORY_CARE_PROVIDER_SITE_OTHER): Payer: Medicaid Other | Admitting: Psychiatry

## 2022-06-26 ENCOUNTER — Encounter: Payer: Self-pay | Admitting: Psychiatry

## 2022-06-26 VITALS — BP 138/71 | HR 82 | Temp 97.2°F | Wt 265.0 lb

## 2022-06-26 DIAGNOSIS — F251 Schizoaffective disorder, depressive type: Secondary | ICD-10-CM

## 2022-06-26 DIAGNOSIS — R4183 Borderline intellectual functioning: Secondary | ICD-10-CM

## 2022-06-26 DIAGNOSIS — F411 Generalized anxiety disorder: Secondary | ICD-10-CM | POA: Diagnosis not present

## 2022-06-26 NOTE — Progress Notes (Unsigned)
[facility] MD OP Progress Note  06/26/2022 10:55 AM Karen Cox  MRN:  [id number]  Chief Complaint:  Chief Complaint  Patient presents with   Follow-up   Medication Refill: 61 year old African-American female with history of schizoaffective disorder, anxiety, presented for medication management.   HPI: Karen Cox is a 61 year old African-American female, lives in [place], has a history of GAD, schizoaffective disorder, borderline intellectual functioning, morbid obesity, seasonal allergies, insomnia was evaluated in office today.  Patient today reports she is currently stable on the current medication regimen.  Denies any significant sadness, anxiety.  Denies any mood swings.  Denies any hallucinations, did not appear to be paranoid or delusional.  Patient reports appetite as good.  She however has been watching her diet, trying to lose weight.  Reports sleep is overall good.  Does continue to have short-term memory problems.  However she has been able to function okay at home.  Interested in starting driving her vehicle again, wonders if she will be able to drive or not.  Discussed referral for occupational therapy evaluation, agreeable to do so.  Denies any suicidality, homicidality.  Does report auditory hallucinations of her name being called 2-3 times a week, it does not bother her.  Denies any side effects to medications.     Visit Diagnosis:    ICD-10-CM   1. Schizoaffective disorder, depressive type ([place])  F25.1 Ambulatory referral to Occupational Therapy    2. GAD (generalized anxiety disorder)  F41.1     3. Borderline intellectual functioning  R41.83 Ambulatory referral to Occupational Therapy      Past Psychiatric History: Reviewed past psychiatric history from progress note on 12/09/2019.  Past trials of BuSpar, hydroxyzine, melatonin, Paxil, Wellbutrin.  Past Medical History:  Past Medical History:  Diagnosis Date   Allergy    Anxiety    Cervical  high risk HPV (human papillomavirus) test positive 05/17/2014   Depression    Eczema    GERD (gastroesophageal reflux disease)    Hot flashes 09/01/2013   Hyperlipidemia    Mild mental slowing    Peri-menopause 08/05/2013   PMB (postmenopausal bleeding) 11/16/2013   Urticaria    Vaginal itching 08/05/2013   Yeast infection 08/05/2013    Past Surgical History:  Procedure Laterality Date   CERVICAL POLYPECTOMY N/A 01/06/2014   Procedure: CERVICAL POLYPECTOMY;  Surgeon: Florian Buff, MD;  Location: [facility];  Service: Gynecology;  Laterality: N/A;   COLONOSCOPY  09/19/2012   3 mm sessile polyp removed from the colon (simple adenoma).  Advised 10-year follow-up colonoscopy   DILITATION & CURRETTAGE/HYSTROSCOPY WITH THERMACHOICE ABLATION N/A 01/06/2014   Procedure: DILATATION & CURETTAGE/HYSTEROSCOPY WITH THERMACHOICE ABLATION;  Surgeon: Florian Buff, MD;  Location: [facility];  Service: Gynecology;  Laterality: N/A;  total therapy time:  9 minutes 12 seconds ,     temperature:87 degrees    Family Psychiatric History: Reviewed family psychiatric history from progress note on 12/09/2019.  Family History:  Family History  Problem Relation Age of Onset   Diabetes Father    Heart disease Father    Hypertension Father    Hyperlipidemia Father    Diabetes Maternal Uncle    Anemia Maternal Uncle    Arthritis Maternal Uncle    Anxiety disorder Maternal Uncle    Arthritis Maternal Grandmother    Anxiety disorder Maternal Grandmother    Heart disease Maternal Grandfather        pacemaker   Anxiety disorder Daughter    Colon cancer  Neg Hx    Cancer Neg Hx    Allergic rhinitis Neg Hx    Angioedema Neg Hx    Asthma Neg Hx    Eczema Neg Hx    Immunodeficiency Neg Hx    Urticaria Neg Hx    Atopy Neg Hx     Social History: Reviewed social history from progress note on 12/09/2019. Social History   Socioeconomic History   Marital status: Single    Spouse name: Not on file   Number of children: 1    Years of education: Not on file   Highest education level: High school graduate  Occupational History   Not on file  Tobacco Use   Smoking status: Former    Packs/day: 0.25    Years: 3.00    Total pack years: 0.75    Types: Cigarettes    Quit date: 09/03/1981    Years since quitting: 40.8   Smokeless tobacco: Never  Vaping Use   Vaping Use: Never used  Substance and Sexual Activity   Alcohol use: Yes    Comment: occ   Drug use: No   Sexual activity: Not Currently    Birth control/protection: Post-menopausal  Other Topics Concern   Not on file  Social History Narrative   Not on file   Social Determinants of Health   Financial Resource Strain: Not on file  Food Insecurity: Not on file  Transportation Needs: Not on file  Physical Activity: Not on file  Stress: Not on file  Social Connections: Not on file    Allergies:  Allergies  Allergen Reactions   Benadryl  [Diphenhydramine Hcl (Sleep)]    Benadryl [Diphenhydramine Hcl] Other (See Comments)    Makes congestion worst    Dextromethorphan Hbr Hives   Robitussin (Alcohol Free) [Guaifenesin]     Makes cough worse   Robitussin Dm Max Day-Night Other (See Comments)   Aspirin Rash and Hives    rash   Diphenhydramine Hcl Hives and Rash    Makes congestion worst    Latex Rash    Metabolic Disorder Labs: Lab Results  Component Value Date   HGBA1C 5.7 (H) 02/16/2022   MPG 114 07/25/2021   MPG 94 08/18/2013   Lab Results  Component Value Date   PROLACTIN 18.6 07/25/2021   Lab Results  Component Value Date   CHOL 193 02/16/2022   TRIG 147 02/16/2022   HDL 37 (L) 02/16/2022   CHOLHDL 5.2 (H) 02/16/2022   VLDL 20 08/17/2016   LDLCALC 129 (H) 02/16/2022   LDLCALC 130 (H) 04/21/2021   Lab Results  Component Value Date   TSH 2.346 07/25/2021   TSH 1.83 03/18/2020    Therapeutic Level Labs: No results found for: "LITHIUM" No results found for: "VALPROATE" No results found for: "CBMZ"  Current  Medications: Current Outpatient Medications  Medication Sig Dispense Refill   Biotin 1000 MCG tablet Take 1,000 mcg by mouth daily.     buPROPion (WELLBUTRIN XL) 300 MG 24 hr tablet Take 1 tablet (300 mg total) by mouth daily. 90 tablet 1   Cyanocobalamin (B-12) 500 MCG TABS Take by mouth.     famotidine (PEPCID) 20 MG tablet Take 1 tablet (20 mg total) by mouth at bedtime. 30 tablet 3   hydrOXYzine (ATARAX) 25 MG tablet Take 25 mg by mouth 3 (three) times daily as needed.     medroxyPROGESTERone (PROVERA) 5 MG tablet Take 5 mg by mouth daily.     melatonin 1 MG TABS  tablet Take 1 mg by mouth at bedtime as needed.     montelukast (SINGULAIR) 10 MG tablet Take 1 tablet (10 mg total) by mouth at bedtime. 30 tablet 3   Omega-3 Fatty Acids (FISH OIL) 600 MG CAPS Take by mouth.     PARoxetine (PAXIL) 30 MG tablet Take 1 tablet (30 mg total) by mouth daily. 90 tablet 1   propranolol ER (INDERAL LA) 60 MG 24 hr capsule Take 1 capsule (60 mg total) by mouth daily. 30 capsule 1   pyridoxine (B-6) 100 MG tablet Take 100 mg by mouth daily.     QUEtiapine (SEROQUEL XR) 50 MG TB24 24 hr tablet TAKE (2) TABLETS BY MOUTH AT BEDTIME. 180 tablet 0   triamterene-hydrochlorothiazide (MAXZIDE-25) 37.5-25 MG tablet Take 1 tablet by mouth daily. 90 tablet 3   Vitamin D3 (VITAMIN D) 25 MCG tablet Take 1,000 Units by mouth daily.     pantoprazole (PROTONIX) 40 MG tablet TAKE 1 TABLET BY MOUTH TWICE A DAY BEFORE A MEAL. (Patient not taking: Reported on 06/26/2022) 60 tablet 5   Current Facility-Administered Medications  Medication Dose Route Frequency Provider Last Rate Last Admin   omalizumab Arvid Right) injection 300 mg  300 mg Subcutaneous Q28 days Valentina Shaggy, MD   300 mg at 12/31/17 1035     Musculoskeletal: Strength & Muscle Tone: within normal limits Gait & Station: normal Patient leans: N/A  Psychiatric Specialty Exam: Review of Systems  Psychiatric/Behavioral:  Positive for hallucinations.    All other systems reviewed and are negative.   Blood pressure 138/71, pulse 82, temperature (!) 97.2 F (36.2 C), temperature source Temporal, weight 265 lb (120.2 kg), last menstrual period 06/27/2013.Body mass index is 44.1 kg/m.  General Appearance: Casual  Eye Contact:  Fair  Speech:  Clear and Coherent  Volume:  Normal  Mood:  Euthymic  Affect:  Congruent  Thought Process:  Goal Directed and Descriptions of Associations: Intact  Orientation:  Full (Time, Place, and Person)  Thought Content: Hallucinations: Auditory Hear her name being called, 2-3 times a week-does not bother her.  Suicidal Thoughts:  No  Homicidal Thoughts:  No  Memory:  Immediate;   Fair Recent;   Fair Remote;   Poor  Judgement:  Fair  Insight:  Fair  Psychomotor Activity:  Normal  Concentration:  Concentration: Fair and Attention Span: Good  Recall:  [place] of Knowledge: Good  Language: Fair  Akathisia:  No  Handed:  Right  AIMS (if indicated): done  Assets:  Communication Skills Desire for Improvement Housing Social Support  ADL's:  Intact  Cognition: baseline  Sleep:  Fair   Screenings: [facility] Office Visit from 06/26/2022 in [facility] Video Visit from 02/27/2022 in [facility] Total Score 0 0      GAD-7    [place] Visit from 06/26/2022 in [facility] Office Visit from 10/13/2019 in [facility] Office Visit from 09/08/2019 in [facility]  Total GAD-7 Score '3 7 15      '$ PHQ2-9    [place] Visit from 06/26/2022 in [facility] Visit from 06/01/2022 in [facility] Visit from 03/13/2022 in [facility] Video Visit from 02/27/2022 in [facility] Visit from 02/16/2022 in [facility]  PHQ-2 Total Score 0 1 0 0 3  PHQ-9 Total Score --  -- -- --  [place] Office Visit from 06/26/2022 in [facility] Video Visit from 02/27/2022 in [facility] Office Visit from 02/16/2022 in [facility]  C-SSRS RISK CATEGORY No Risk No Risk Error: Q3, 4, or 5 should not be populated when Q2 is No        Assessment and Plan: MANNAT BENEDETTI is a 61 year old African-American female who lives in [place], has a history of GAD, schizoaffective disorder, borderline intellectual functioning, morbid obesity was evaluated in office today.  Patient is currently stable.  Plan as noted below.  Plan Schizoaffective disorder-stable Wellbutrin 300 mg p.o. daily Paxil 30 mg p.o. daily Seroquel extended release 100 mg p.o. nightly.  GAD-stable Paxil 30 mg p.o. daily Hydroxyzine 25 mg p.o. 3 times daily as needed.  Borderline intellectual functioning-chronic-stable Patient to continue CBT as needed. Patient is interested in occupational therapy evaluation to determine her ability to drive her vehicle, will refer for the same.  Follow-up in clinic in 3 months or sooner if needed.  This note was generated in part or whole with voice recognition software. Voice recognition is usually quite accurate but there are transcription errors that can and very often do occur. I apologize for any typographical errors that were not detected and corrected.     Ursula Alert, MD 06/27/2022, 8:21 AM

## 2022-06-27 ENCOUNTER — Telehealth: Payer: Self-pay

## 2022-06-27 ENCOUNTER — Other Ambulatory Visit: Payer: Self-pay

## 2022-06-27 MED ORDER — PROPRANOLOL HCL ER 60 MG PO CP24: 60.0000 mg | ORAL_CAPSULE | Freq: Every day | ORAL | 1 refills | Status: DC

## 2022-06-27 NOTE — Telephone Encounter (Signed)
Patient called said pharmacy has not received this meds yet. Patient request refills on this medicine  propranolol ER (INDERAL LA) 60 MG 24 hr capsule   Pharmacy: [facility]

## 2022-06-27 NOTE — Telephone Encounter (Signed)
Refills sent

## 2022-07-02 ENCOUNTER — Telehealth: Payer: Self-pay | Admitting: Psychiatry

## 2022-07-02 NOTE — Telephone Encounter (Signed)
Karen Cox could you please take a look at what needs to be done for OT referral. I had placed an order in the system and it looks like patient never received a call.

## 2022-07-02 NOTE — Telephone Encounter (Signed)
Patient called stating she has not heard from occupational therapy regarding an appointment. Saw notes in referral for new location regarding these services. Not sure if a referral has to be sent to them but gave patient name and phone number of the [facility] in [place].

## 2022-07-11 ENCOUNTER — Ambulatory Visit (HOSPITAL_COMMUNITY)
Admission: RE | Admit: 2022-07-11 | Discharge: 2022-07-11 | Disposition: A | Discharge status: Home / Self Care | Payer: Medicaid Other | Source: Ambulatory Visit | Attending: Family Medicine | Admitting: Family Medicine

## 2022-07-11 DIAGNOSIS — Z1231 Encounter for screening mammogram for malignant neoplasm of breast: Secondary | ICD-10-CM | POA: Diagnosis not present

## 2022-07-13 ENCOUNTER — Ambulatory Visit: Payer: Medicaid Other | Admitting: Family Medicine

## 2022-08-16 ENCOUNTER — Encounter: Payer: Self-pay | Admitting: Family Medicine

## 2022-08-16 ENCOUNTER — Ambulatory Visit: Payer: Medicaid Other | Admitting: Family Medicine

## 2022-08-16 VITALS — BP 120/82 | HR 84 | Ht 64.0 in | Wt 257.0 lb

## 2022-08-16 DIAGNOSIS — J302 Other seasonal allergic rhinitis: Secondary | ICD-10-CM

## 2022-08-16 DIAGNOSIS — Z23 Encounter for immunization: Secondary | ICD-10-CM

## 2022-08-16 DIAGNOSIS — N62 Hypertrophy of breast: Secondary | ICD-10-CM

## 2022-08-16 DIAGNOSIS — F251 Schizoaffective disorder, depressive type: Secondary | ICD-10-CM

## 2022-08-16 DIAGNOSIS — E785 Hyperlipidemia, unspecified: Secondary | ICD-10-CM | POA: Diagnosis not present

## 2022-08-16 DIAGNOSIS — I10 Essential (primary) hypertension: Secondary | ICD-10-CM | POA: Diagnosis not present

## 2022-08-16 MED ORDER — MUPIROCIN CALCIUM 2 % EX CREA: 1.0000 | TOPICAL_CREAM | Freq: Two times a day (BID) | CUTANEOUS | 0 refills | Status: DC

## 2022-08-16 MED ORDER — MONTELUKAST SODIUM 10 MG PO TABS: 10.0000 mg | ORAL_TABLET | Freq: Every day | ORAL | 1 refills | Status: DC

## 2022-08-16 MED ORDER — PROPRANOLOL HCL ER 60 MG PO CP24: 60.0000 mg | ORAL_CAPSULE | Freq: Every day | ORAL | 3 refills | Status: DC

## 2022-08-16 NOTE — Progress Notes (Signed)
Karen Cox     MRN: [id number]      DOB: January 12, 1961   HPI Ms. Rafter is here for follow up and re-evaluation of chronic medical conditions, medication management and review of any available recent lab and radiology data.  Preventive health is updated, specifically  Cancer screening and Immunization.   Requests letter of medical neccesity be sent to Dr Nadara Mustard for breast reduction. The PT denies any adverse reactions to current medications since the last visit.  Wears 53 DDD not fitting, chronic back and shoulder pain ROS Denies recent fever or chills. C/o increased sinus pressure and drainage which is clear and sneezing, denies , ear pain or sore throat. Denies chest congestion, productive cough or wheezing. Denies chest pains, palpitations and leg swelling Denies abdominal pain, nausea, vomiting,diarrhea or constipation.   Denies dysuria, frequency, hesitancy or incontinence.  Denies headaches, seizures, numbness, or tingling. Denies uncontrolled  depression, anxiety or insomnia. Denies skin break down or rash.   PE  BP 120/82   Pulse 84   Ht '5\' 4"'$  (1.626 m)   Wt 257 lb (116.6 kg)   LMP 06/27/2013   SpO2 95%   BMI 44.11 kg/m   Patient alert and oriented and in no cardiopulmonary distress.  HEENT: No facial asymmetry, EOMI,     Neck supple .No sinus tenderness  Chest: Clear to auscultation bilaterally.  CVS: S1, S2 no murmurs, no S3.Regular rate.  ABD: Soft non tender.   Ext: No edema  MS: Adequate ROM spine, shoulders, hips and knees.  Skin: indentation and hyperpigmentation of shoulder , also recurrent skin breakdown reported in   Psych: Good eye contact, normal affect. Memory intact not anxious or depressed appearing.  CNS: CN 2-12 intact, power,  normal throughout.no focal deficits noted.   Assessment & Plan  Essential hypertension Need to change med though controlled due to aKI, stop triamterene , start amlodipine 5 mg daily, re eval in 5  to 6 weeks DASH diet and commitment to daily physical activity for a minimum of 30 minutes discussed and encouraged, as a part of hypertension management. The importance of attaining a healthy weight is also discussed.     08/16/2022   11:54 AM 08/16/2022   11:24 AM 06/26/2022   10:04 AM 06/01/2022    1:27 PM 06/01/2022    1:00 PM 04/18/2022    8:33 AM 02/16/2022   11:14 AM  BP/Weight  Systolic BP 170 017  494 496 759 163  Diastolic BP 82 82  90 88 89 92  Wt. (Lbs)  257   263.12 266.6   BMI  44.11 kg/m2   43.79 kg/m2 44.36 kg/m2      Information is confidential and restricted. Go to Review Flowsheets to unlock data.       Seasonal allergies Uncontrolled start daily medication which is prescribed  Schizoaffective disorder, depressive type ([place]) Controlled and managed by Psych  Morbid obesity ([place]) Improved which s great  Patient re-educated about  the importance of commitment to a  minimum of 150 minutes of exercise per week as able.  The importance of healthy food choices with portion control discussed, as well as eating regularly and within a 12 hour window most days. The need to choose "clean , green" food 50 to 75% of the time is discussed, as well as to make water the primary drink and set a goal of 64 ounces water daily.       08/16/2022   11:24 AM 06/26/2022  10:04 AM 06/01/2022    1:00 PM  Weight /BMI  Weight 257 lb  263 lb 1.9 oz  Height '5\' 4"'$  (1.626 m)  '5\' 5"'$  (1.651 m)  BMI 44.11 kg/m2  43.79 kg/m2     Information is confidential and restricted. Go to Review Flowsheets to unlock data.      Macromastia Currently wears 53 DDD bra which still does not fit, has chronic upper back and shoulder pain needs and will benefit from breast reduction surgery referral sent , and letter of medical necessity to follow

## 2022-08-16 NOTE — Patient Instructions (Addendum)
F/U in 4 months, Shingrix #2 at visit  You have lost 22 pounds in the past 6 months, which is eXCELLENT  Shingrix #1 today  Lipid, cmp and EGFr and hBA1C today after 1 pm\  Medication for allergies and topical antibiotic are prescribed  Letter of medical necessity will be included on referral info to Dr of your choice Nadara Mustard, gen surg clinic, [address], [place],  [phone] (fax)  It is important that you exercise regularly at least 30 minutes 5 times a week. If you develop chest pain, have severe difficulty breathing, or feel very tired, stop exercising immediately and seek medical attention   Think about what you will eat, plan ahead. Choose " clean, green, fresh or frozen" over canned, processed or packaged foods which are more sugary, salty and fatty. 70 to 75% of food eaten should be vegetables and fruit. Three meals at set times with snacks allowed between meals, but they must be fruit or vegetables. Aim to eat over a 12 hour period , example 7 am to 7 pm, and STOP after  your last meal of the day. Drink water,generally about 64 ounces per day, no other drink is as healthy. Fruit juice is best enjoyed in a healthy way, by EATING the fruit.  Thanks for choosing [facility], we consider it a privelige to serve you.

## 2022-08-17 ENCOUNTER — Other Ambulatory Visit: Payer: Self-pay

## 2022-08-17 DIAGNOSIS — I10 Essential (primary) hypertension: Secondary | ICD-10-CM

## 2022-08-17 LAB — CMP14+EGFR
ALT: 19 IU/L (ref 0–32)
AST: 22 IU/L (ref 0–40)
Albumin/Globulin Ratio: 1.7 (ref 1.2–2.2)
Albumin: 4.8 g/dL (ref 3.8–4.9)
Alkaline Phosphatase: 105 IU/L (ref 44–121)
BUN/Creatinine Ratio: 11 — ABNORMAL LOW (ref 12–28)
BUN: 17 mg/dL (ref 8–27)
Bilirubin Total: 0.8 mg/dL (ref 0.0–1.2)
CO2: 21 mmol/L (ref 20–29)
Calcium: 10.3 mg/dL (ref 8.7–10.3)
Chloride: 102 mmol/L (ref 96–106)
Creatinine, Ser: 1.5 mg/dL — ABNORMAL HIGH (ref 0.57–1.00)
Globulin, Total: 2.9 g/dL (ref 1.5–4.5)
Glucose: 86 mg/dL (ref 70–99)
Potassium: 4.2 mmol/L (ref 3.5–5.2)
Sodium: 144 mmol/L (ref 134–144)
Total Protein: 7.7 g/dL (ref 6.0–8.5)
eGFR: 40 mL/min/{1.73_m2} — ABNORMAL LOW (ref >59)

## 2022-08-17 LAB — HEMOGLOBIN A1C
Est. average glucose Bld gHb Est-mCnc: 114 mg/dL
Hgb A1c MFr Bld: 5.6 % (ref 4.8–5.6)

## 2022-08-17 LAB — LIPID PANEL
Chol/HDL Ratio: 5.8 ratio — ABNORMAL HIGH (ref 0.0–4.4)
Cholesterol, Total: 210 mg/dL — ABNORMAL HIGH (ref 100–199)
HDL: 36 mg/dL — ABNORMAL LOW (ref >39)
LDL Chol Calc (NIH): 139 mg/dL — ABNORMAL HIGH (ref 0–99)
Triglycerides: 193 mg/dL — ABNORMAL HIGH (ref 0–149)
VLDL Cholesterol Cal: 35 mg/dL (ref 5–40)

## 2022-08-17 MED ORDER — AMLODIPINE BESYLATE 5 MG PO TABS: 5.0000 mg | ORAL_TABLET | Freq: Every day | ORAL | 2 refills | Status: DC

## 2022-08-17 NOTE — Assessment & Plan Note (Signed)
Need to change med though controlled due to aKI, stop triamterene , start amlodipine 5 mg daily, re eval in 5 to 6 weeks DASH diet and commitment to daily physical activity for a minimum of 30 minutes discussed and encouraged, as a part of hypertension management. The importance of attaining a healthy weight is also discussed.     08/16/2022   11:54 AM 08/16/2022   11:24 AM 06/26/2022   10:04 AM 06/01/2022    1:27 PM 06/01/2022    1:00 PM 04/18/2022    8:33 AM 02/16/2022   11:14 AM  BP/Weight  Systolic BP 564 332  951 884 166 063  Diastolic BP 82 82  90 88 89 92  Wt. (Lbs)  257   263.12 266.6   BMI  44.11 kg/m2   43.79 kg/m2 44.36 kg/m2      Information is confidential and restricted. Go to Review Flowsheets to unlock data.

## 2022-08-19 ENCOUNTER — Encounter: Payer: Self-pay | Admitting: Family Medicine

## 2022-08-19 NOTE — Assessment & Plan Note (Signed)
Currently wears 53 DDD bra which still does not fit, has chronic upper back and shoulder pain needs and will benefit from breast reduction surgery referral sent , and letter of medical necessity to follow

## 2022-08-19 NOTE — Assessment & Plan Note (Signed)
Controlled and managed by Psych

## 2022-08-19 NOTE — Assessment & Plan Note (Signed)
Improved which s great  Patient re-educated about  the importance of commitment to a  minimum of 150 minutes of exercise per week as able.  The importance of healthy food choices with portion control discussed, as well as eating regularly and within a 12 hour window most days. The need to choose "clean , green" food 50 to 75% of the time is discussed, as well as to make water the primary drink and set a goal of 64 ounces water daily.       08/16/2022   11:24 AM 06/26/2022   10:04 AM 06/01/2022    1:00 PM  Weight /BMI  Weight 257 lb  263 lb 1.9 oz  Height '5\' 4"'$  (1.626 m)  '5\' 5"'$  (1.651 m)  BMI 44.11 kg/m2  43.79 kg/m2     Information is confidential and restricted. Go to Review Flowsheets to unlock data.

## 2022-08-19 NOTE — Assessment & Plan Note (Signed)
Uncontrolled start daily medication which is prescribed

## 2022-08-24 ENCOUNTER — Encounter: Payer: Self-pay | Admitting: *Deleted

## 2022-09-03 ENCOUNTER — Other Ambulatory Visit: Payer: Self-pay

## 2022-09-03 ENCOUNTER — Telehealth: Payer: Self-pay | Admitting: Family Medicine

## 2022-09-03 ENCOUNTER — Other Ambulatory Visit: Payer: Self-pay | Admitting: Family Medicine

## 2022-09-03 MED ORDER — AMLODIPINE BESYLATE 5 MG PO TABS: ORAL_TABLET | ORAL | 2 refills | Status: DC

## 2022-09-03 NOTE — Telephone Encounter (Signed)
Patient need refill request 90 day supply  amLODipine (NORVASC) 5 MG tablet [[id number]  Pharmacy  [facility], [place], [place] [address] Phone: [phone]  Fax: [phone] DEA #: --  DAW Reason: --

## 2022-09-03 NOTE — Telephone Encounter (Signed)
Refill sent.

## 2022-09-05 ENCOUNTER — Ambulatory Visit: Payer: Medicaid Other | Admitting: Family Medicine

## 2022-09-05 IMAGING — MG DIGITAL SCREENING BILAT W/ TOMO W/ CAD
6 of 15 series · 6 of 40 positions shown · non-contrast
Comparison: Previous exam(s).

ACR Breast Density Category a: The breast tissue is almost entirely
fatty.

CLINICAL DATA: Screening.

EXAM:
DIGITAL SCREENING BILATERAL MAMMOGRAM WITH TOMO AND CAD

[R CV synth-2D]
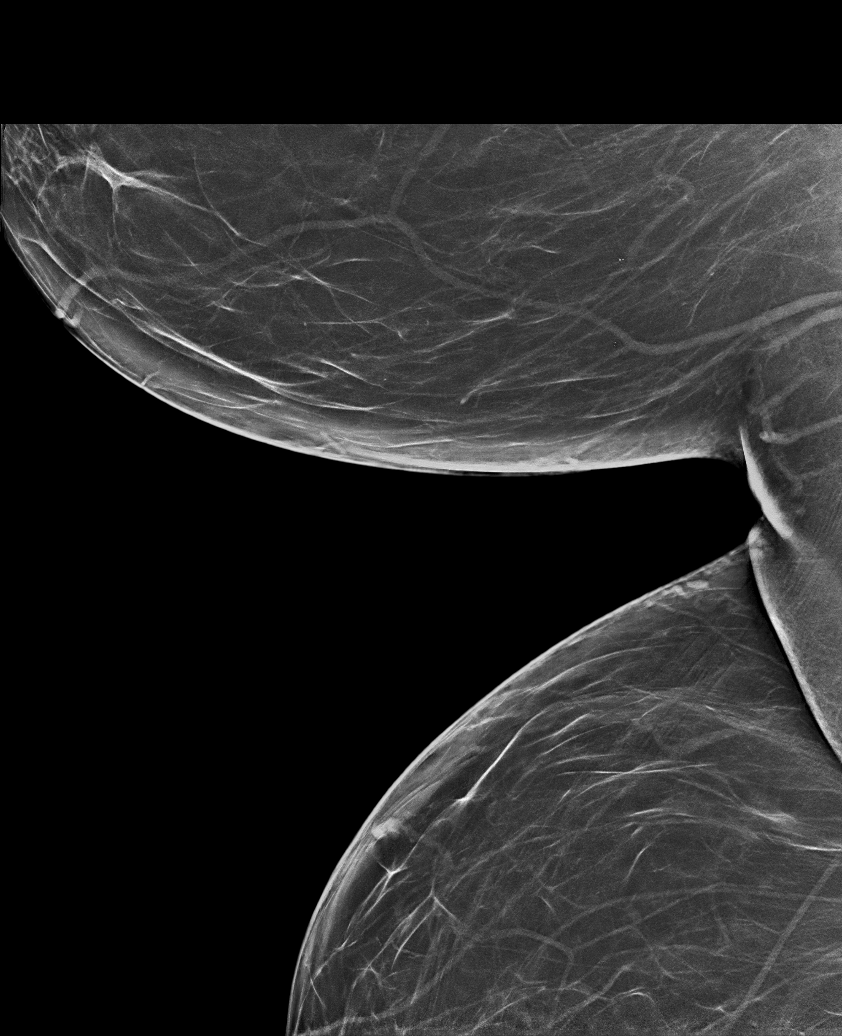

[L MLO synth-2D (1 of 2)]
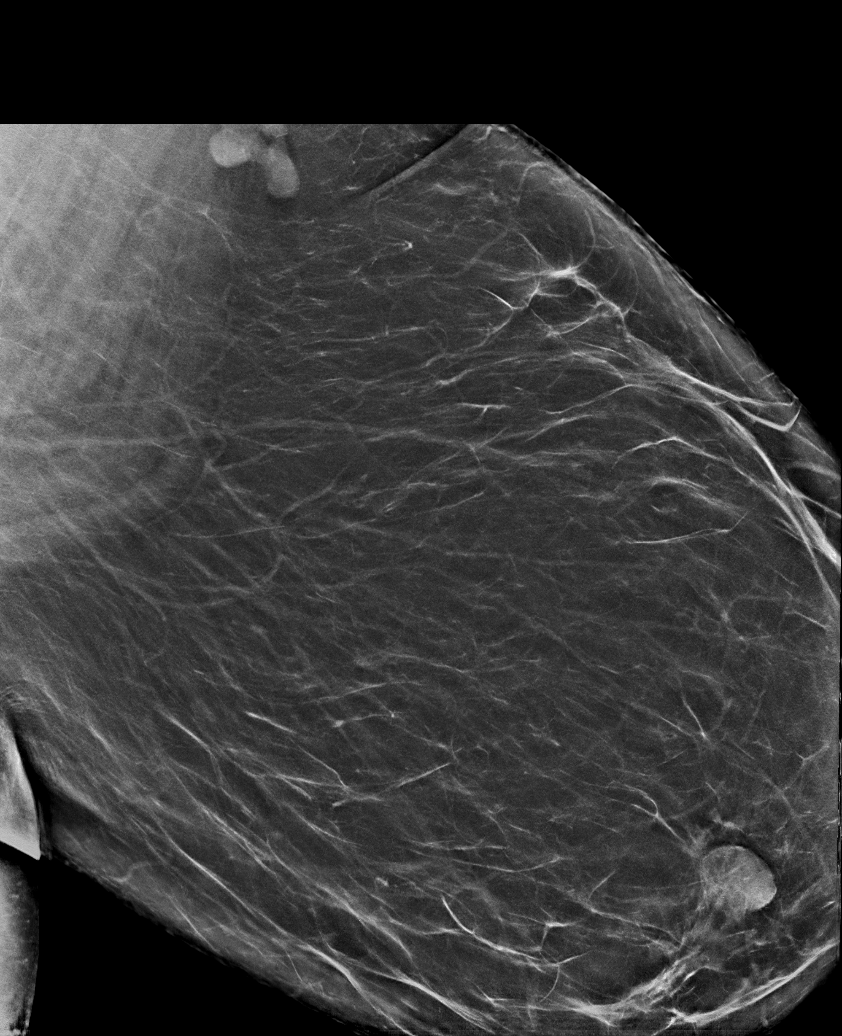

[L CC synth-2D]
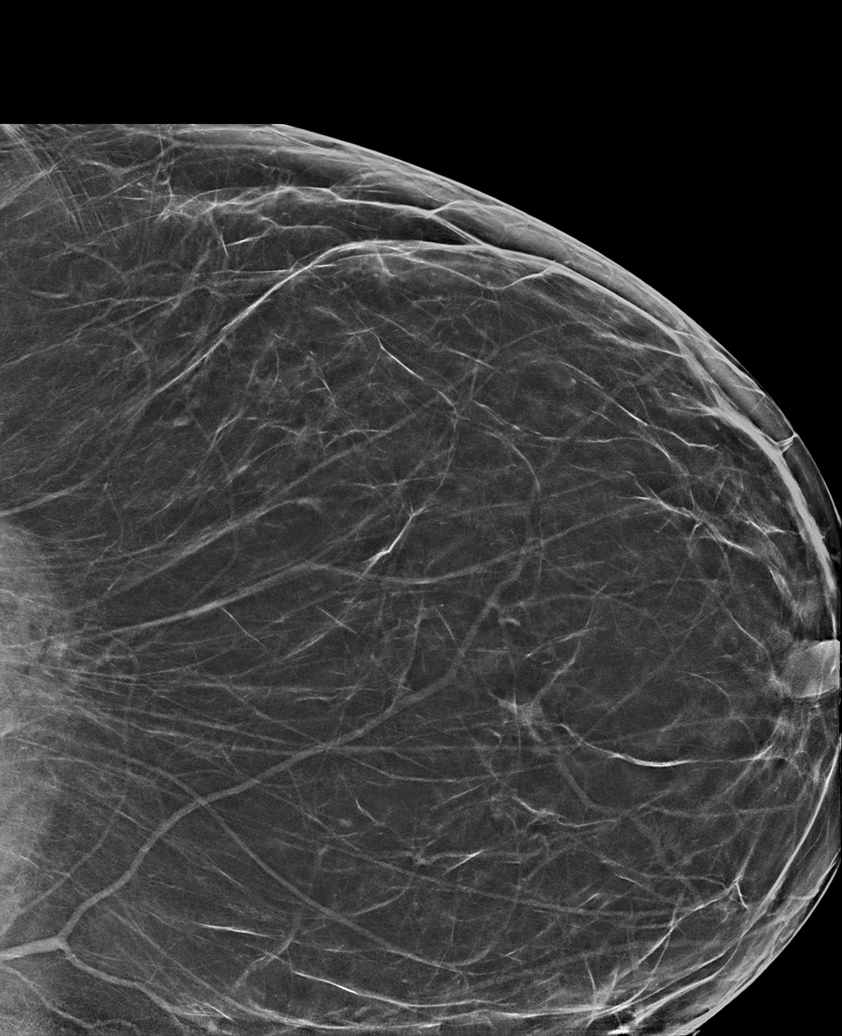

[R MLO synth-2D]
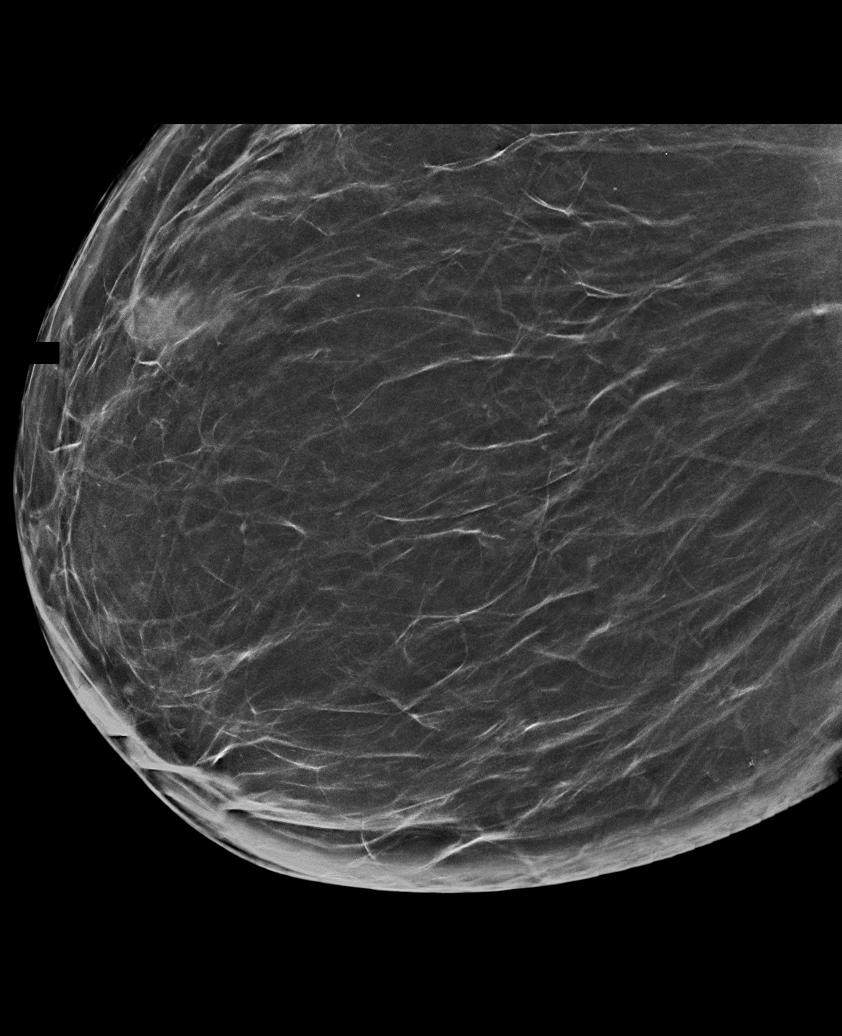

[L MLO synth-2D (2 of 2)]
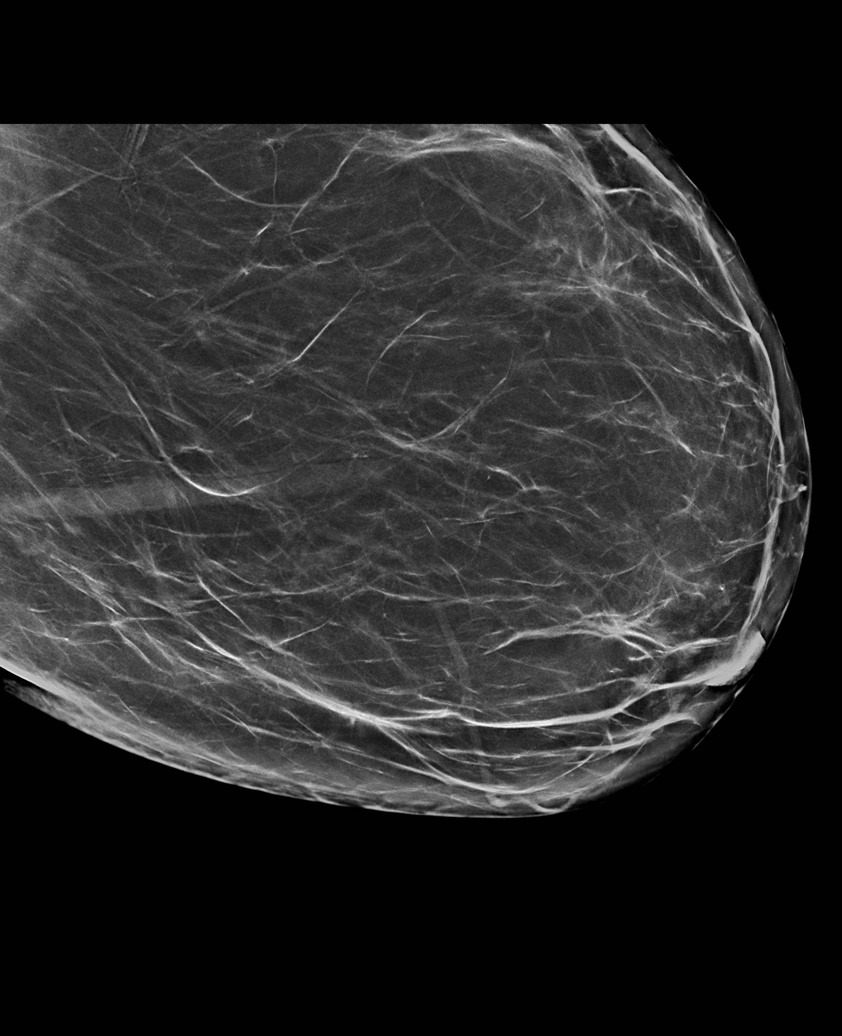

[R CC synth-2D]
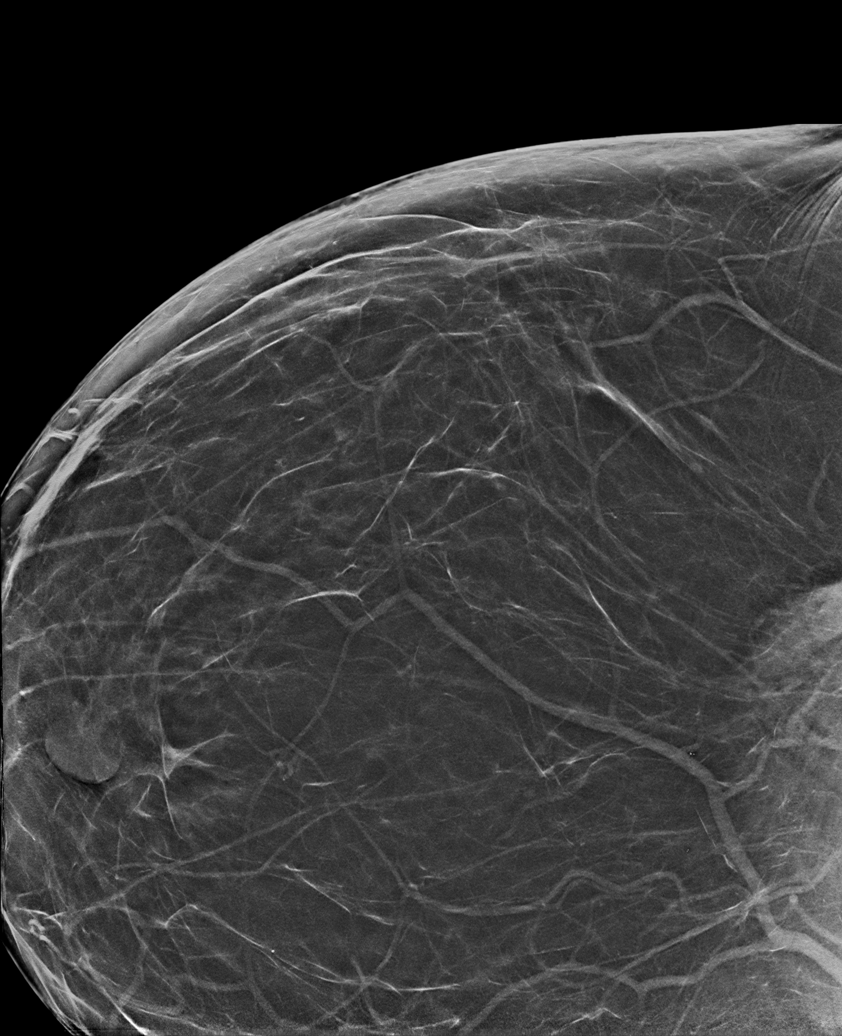

[6 of 40 positions shown; findings below may reference images not displayed]

FINDINGS: There are no findings suspicious for malignancy. Images were
processed with CAD.
IMPRESSION: No mammographic evidence of malignancy. A result letter of this
screening mammogram will be mailed directly to the patient.

RECOMMENDATION:
Screening mammogram in one year. (Code:[id number])

BI-RADS CATEGORY  1: Negative.

## 2022-09-14 ENCOUNTER — Other Ambulatory Visit: Payer: Self-pay | Admitting: Family Medicine

## 2022-09-18 ENCOUNTER — Other Ambulatory Visit: Payer: Self-pay | Admitting: Psychiatry

## 2022-09-18 DIAGNOSIS — F251 Schizoaffective disorder, depressive type: Secondary | ICD-10-CM

## 2022-09-19 ENCOUNTER — Ambulatory Visit: Payer: Medicaid Other | Admitting: Gastroenterology

## 2022-09-19 ENCOUNTER — Other Ambulatory Visit: Payer: Self-pay | Admitting: Psychiatry

## 2022-09-19 DIAGNOSIS — F251 Schizoaffective disorder, depressive type: Secondary | ICD-10-CM

## 2022-09-19 DIAGNOSIS — F411 Generalized anxiety disorder: Secondary | ICD-10-CM

## 2022-09-26 ENCOUNTER — Ambulatory Visit: Payer: Medicaid Other | Admitting: Psychiatry

## 2022-09-27 ENCOUNTER — Ambulatory Visit: Payer: Medicaid Other | Admitting: Family Medicine

## 2022-10-04 ENCOUNTER — Ambulatory Visit: Payer: Medicaid Other | Admitting: Family Medicine

## 2022-10-11 ENCOUNTER — Ambulatory Visit (INDEPENDENT_AMBULATORY_CARE_PROVIDER_SITE_OTHER): Payer: Medicaid Other | Admitting: Family Medicine

## 2022-10-11 ENCOUNTER — Encounter: Payer: Self-pay | Admitting: Family Medicine

## 2022-10-11 VITALS — BP 124/84 | HR 92 | Ht 65.0 in | Wt 261.1 lb

## 2022-10-11 DIAGNOSIS — I10 Essential (primary) hypertension: Secondary | ICD-10-CM | POA: Diagnosis not present

## 2022-10-11 DIAGNOSIS — R7303 Prediabetes: Secondary | ICD-10-CM | POA: Diagnosis not present

## 2022-10-11 DIAGNOSIS — R87810 Cervical high risk human papillomavirus (HPV) DNA test positive: Secondary | ICD-10-CM

## 2022-10-11 DIAGNOSIS — N62 Hypertrophy of breast: Secondary | ICD-10-CM

## 2022-10-11 DIAGNOSIS — E785 Hyperlipidemia, unspecified: Secondary | ICD-10-CM

## 2022-10-11 MED ORDER — ROSUVASTATIN CALCIUM 10 MG PO TABS: 10.0000 mg | ORAL_TABLET | Freq: Every day | ORAL | 1 refills | Status: DC

## 2022-10-11 MED ORDER — AMLODIPINE BESYLATE 5 MG PO TABS: 5.0000 mg | ORAL_TABLET | Freq: Every day | ORAL | 3 refills | Status: DC

## 2022-10-11 MED ORDER — WEGOVY 0.25 MG/0.5ML ~~LOC~~ SOAJ: 0.2500 mg | SUBCUTANEOUS | 0 refills | Status: DC

## 2022-10-11 NOTE — Patient Instructions (Signed)
Follow-up in 5 weeks to reevaluate weight, call if you need me sooner.  Shingrix No. 2 at follow-up visit per patient request.  Chem-7 and EGFR today to reassess kidney function.  Blood pressure has improved continue same medications as prescribed.  New for cholesterol is Crestor 10 mg daily.  It is important that you change food choices mainly vegetable and fruit so that your cholesterol will be lowered also.  New medication Wegovy once weekly is prescribed to help with weight loss.  We will see if your insurance will cover this.  It is vital that you do change food choices and drinks.  Commit to 64 ounces of water daily and reduce and eliminate sweets and  limit white starchy foods.  Please also try to start an regular exercise program where you are doing physical activity for 30 minutes 5 days/week.  Your colonoscopy is due please follow through and have this done also  Thanks for choosing [facility], we consider it a privelige to serve you.

## 2022-10-12 ENCOUNTER — Telehealth: Payer: Self-pay | Admitting: Family Medicine

## 2022-10-12 LAB — BMP8+EGFR
BUN/Creatinine Ratio: 13 (ref 12–28)
BUN: 13 mg/dL (ref 8–27)
CO2: 20 mmol/L (ref 20–29)
Calcium: 9.7 mg/dL (ref 8.7–10.3)
Chloride: 104 mmol/L (ref 96–106)
Creatinine, Ser: 1.04 mg/dL — ABNORMAL HIGH (ref 0.57–1.00)
Glucose: 96 mg/dL (ref 70–99)
Potassium: 4.6 mmol/L (ref 3.5–5.2)
Sodium: 144 mmol/L (ref 134–144)
eGFR: 62 mL/min/{1.73_m2} (ref >59)

## 2022-10-12 NOTE — Telephone Encounter (Signed)
Spoke with patient.

## 2022-10-12 NOTE — Telephone Encounter (Signed)
Pt returned call

## 2022-10-13 ENCOUNTER — Encounter: Payer: Self-pay | Admitting: Family Medicine

## 2022-10-13 NOTE — Assessment & Plan Note (Signed)
Followed by Karen Cox

## 2022-10-13 NOTE — Progress Notes (Signed)
Karen Cox     MRN: [id number]      DOB: April 21, 1961   HPI Karen Cox is here for follow up and re-evaluation of chronic medical conditions, medication management and review of any available recent lab and radiology data.  Preventive health is updated, specifically  Cancer screening and Immunization.   Questions or concerns regarding consultations or procedures which the PT has had in the interim are  addressed. The PT denies any adverse reactions to current medications since the last visit.  There are no new concerns.  There are no specific complaints   ROS Denies recent fever or chills. Denies sinus pressure, nasal congestion, ear pain or sore throat. Denies chest congestion, productive cough or wheezing. Denies chest pains, palpitations and leg swelling Denies abdominal pain, nausea, vomiting,diarrhea or constipation.   Denies dysuria, frequency, hesitancy or incontinence. C/o upper back and shoulder pai, has tried getting breast reduction surgery , but needs to lose weight Denies headaches, seizures, numbness, or tingling. Denies uncontrolled depression, anxiety or insomnia. Denies skin break down or rash.   PE  BP 124/84   Pulse 92   Ht '5\' 5"'$  (1.651 m)   Wt 261 lb 1.3 oz (118.4 kg)   LMP 06/27/2013   SpO2 96%   BMI 43.45 kg/m   Patient alert and oriented and in no cardiopulmonary distress.  HEENT: No facial asymmetry, EOMI,     Neck supple .  Chest: Clear to auscultation bilaterally.  CVS: S1, S2 no murmurs, no S3.Regular rate.  ABD: Soft non tender.   Ext: No edema  MS: Adequate ROM spine, shoulders, hips and knees.  Skin: Intact, no ulcerations or rash noted.  Psych: Good eye contact, normal affect. Memory intact not anxious or depressed appearing.  CNS: CN 2-12 intact, power,  normal throughout.no focal deficits noted.   Assessment & Plan  Essential hypertension Controlled, no change in medication DASH diet and commitment to daily  physical activity for a minimum of 30 minutes discussed and encouraged, as a part of hypertension management. The importance of attaining a healthy weight is also discussed.     10/11/2022   10:15 AM 10/11/2022   10:01 AM 10/11/2022    9:35 AM 08/16/2022   11:54 AM 08/16/2022   11:24 AM 06/26/2022   10:04 AM 06/01/2022    1:27 PM  BP/Weight  Systolic BP 220 254 270 623 762  831  Diastolic BP 84 86 93 82 82  90  Wt. (Lbs)   261.08  257    BMI   43.45 kg/m2  44.11 kg/m2       Information is confidential and restricted. Go to Review Flowsheets to unlock data.       Morbid obesity ([place])  Patient re-educated about  the importance of commitment to a  minimum of 150 minutes of exercise per week as able.  The importance of healthy food choices with portion control discussed, as well as eating regularly and within a 12 hour window most days. The need to choose "clean , green" food 50 to 75% of the time is discussed, as well as to make water the primary drink and set a goal of 64 ounces water daily.       10/11/2022    9:35 AM 08/16/2022   11:24 AM 06/26/2022   10:04 AM  Weight /BMI  Weight 261 lb 1.3 oz 257 lb   Height '5\' 5"'$  (1.651 m) '5\' 4"'$  (1.626 m)   BMI 43.45 kg/m2  44.11 kg/m2      Information is confidential and restricted. Go to Review Flowsheets to unlock data.      Prediabetes Patient educated about the importance of limiting  Carbohydrate intake , the need to commit to daily physical activity for a minimum of 30 minutes , and to commit weight loss. The fact that changes in all these areas will reduce or eliminate all together the development of diabetes is stressed.      Latest Ref Rng & Units 10/11/2022   10:29 AM 08/16/2022    1:07 PM 02/16/2022   11:32 AM 07/25/2021   11:12 AM 04/21/2021    2:15 PM  Diabetic Labs  HbA1c 4.8 - 5.6 %  5.6  5.7  5.6    Chol 100 - 199 mg/dL  210  193   203   HDL >39 mg/dL  36  37   35   Calc LDL 0 - 99 mg/dL  139  129   130   Triglycerides 0  - 149 mg/dL  193  147   211   Creatinine 0.57 - 1.00 mg/dL 1.04  1.50  0.97   0.89       10/11/2022   10:15 AM 10/11/2022   10:01 AM 10/11/2022    9:35 AM 08/16/2022   11:54 AM 08/16/2022   11:24 AM 06/26/2022   10:04 AM 06/01/2022    1:27 PM  BP/Weight  Systolic BP 858 850 277 412 878  676  Diastolic BP 84 86 93 82 82  90  Wt. (Lbs)   261.08  257    BMI   43.45 kg/m2  44.11 kg/m2       Information is confidential and restricted. Go to Review Flowsheets to unlock data.       No data to display            Macromastia Cause of upper back and shoulder pain, will benefit from surgery  Hyperlipidemia LDL goal <100 Hyperlipidemia:Low fat diet discussed and encouraged.   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

## 2022-10-13 NOTE — Assessment & Plan Note (Signed)
Patient educated about the importance of limiting  Carbohydrate intake , the need to commit to daily physical activity for a minimum of 30 minutes , and to commit weight loss. The fact that changes in all these areas will reduce or eliminate all together the development of diabetes is stressed.      Latest Ref Rng & Units 10/11/2022   10:29 AM 08/16/2022    1:07 PM 02/16/2022   11:32 AM 07/25/2021   11:12 AM 04/21/2021    2:15 PM  Diabetic Labs  HbA1c 4.8 - 5.6 %  5.6  5.7  5.6    Chol 100 - 199 mg/dL  210  193   203   HDL >39 mg/dL  36  37   35   Calc LDL 0 - 99 mg/dL  139  129   130   Triglycerides 0 - 149 mg/dL  193  147   211   Creatinine 0.57 - 1.00 mg/dL 1.04  1.50  0.97   0.89       10/11/2022   10:15 AM 10/11/2022   10:01 AM 10/11/2022    9:35 AM 08/16/2022   11:54 AM 08/16/2022   11:24 AM 06/26/2022   10:04 AM 06/01/2022    1:27 PM  BP/Weight  Systolic BP 291 916 606 004 599  774  Diastolic BP 84 86 93 82 82  90  Wt. (Lbs)   261.08  257    BMI   43.45 kg/m2  44.11 kg/m2       Information is confidential and restricted. Go to Review Flowsheets to unlock data.       No data to display

## 2022-10-13 NOTE — Assessment & Plan Note (Signed)
  Patient re-educated about  the importance of commitment to a  minimum of 150 minutes of exercise per week as able.  The importance of healthy food choices with portion control discussed, as well as eating regularly and within a 12 hour window most days. The need to choose "clean , green" food 50 to 75% of the time is discussed, as well as to make water the primary drink and set a goal of 64 ounces water daily.       10/11/2022    9:35 AM 08/16/2022   11:24 AM 06/26/2022   10:04 AM  Weight /BMI  Weight 261 lb 1.3 oz 257 lb   Height '5\' 5"'$  (1.651 m) '5\' 4"'$  (1.626 m)   BMI 43.45 kg/m2 44.11 kg/m2      Information is confidential and restricted. Go to Review Flowsheets to unlock data.

## 2022-10-13 NOTE — Assessment & Plan Note (Signed)
Controlled, no change in medication DASH diet and commitment to daily physical activity for a minimum of 30 minutes discussed and encouraged, as a part of hypertension management. The importance of attaining a healthy weight is also discussed.     10/11/2022   10:15 AM 10/11/2022   10:01 AM 10/11/2022    9:35 AM 08/16/2022   11:54 AM 08/16/2022   11:24 AM 06/26/2022   10:04 AM 06/01/2022    1:27 PM  BP/Weight  Systolic BP 371 696 789 381 017  510  Diastolic BP 84 86 93 82 82  90  Wt. (Lbs)   261.08  257    BMI   43.45 kg/m2  44.11 kg/m2       Information is confidential and restricted. Go to Review Flowsheets to unlock data.

## 2022-10-13 NOTE — Assessment & Plan Note (Signed)
Cause of upper back and shoulder pain, will benefit from surgery

## 2022-10-13 NOTE — Assessment & Plan Note (Signed)
Hyperlipidemia:Low fat diet discussed and encouraged.   Lipid Panel  Lab Results  Component Value Date   CHOL 210 (H) 08/16/2022   HDL 36 (L) 08/16/2022   LDLCALC 139 (H) 08/16/2022   TRIG 193 (H) 08/16/2022   CHOLHDL 5.8 (H) 08/16/2022     Start crestor and modify diet

## 2022-10-16 ENCOUNTER — Ambulatory Visit: Payer: Medicaid Other | Admitting: Gastroenterology

## 2022-10-18 ENCOUNTER — Telehealth: Payer: Self-pay | Admitting: Family Medicine

## 2022-10-18 NOTE — Telephone Encounter (Signed)
Patient called in regard to  rosuvastatin (CRESTOR) 10 MG tablet   Patient wants to know if medication affects hormones, wants a call back in regard

## 2022-10-19 NOTE — Telephone Encounter (Signed)
My chart msg sent to patient. 

## 2022-10-22 ENCOUNTER — Telehealth: Payer: Self-pay | Admitting: Psychiatry

## 2022-10-22 DIAGNOSIS — F251 Schizoaffective disorder, depressive type: Secondary | ICD-10-CM

## 2022-10-22 DIAGNOSIS — F411 Generalized anxiety disorder: Secondary | ICD-10-CM

## 2022-10-22 NOTE — Telephone Encounter (Signed)
Patient schedule for appt 10-23-22 but calling for bad weather and afraid to come out. Have her rescheduled to 11-22-22 but also states she will need refills on medication. Seroquel XR 50 mg, Paxil 30 mg, and Wellbutrin XL 300 mg. [facility] [place]

## 2022-10-23 ENCOUNTER — Ambulatory Visit: Payer: Medicaid Other | Admitting: Psychiatry

## 2022-10-23 MED ORDER — BUPROPION HCL ER (XL) 300 MG PO TB24: 300.0000 mg | ORAL_TABLET | Freq: Every day | ORAL | 1 refills | Status: DC

## 2022-10-23 NOTE — Telephone Encounter (Signed)
I have sent Wellbutrin XL 300 mg to [facility].  Paxil and Seroquel already sent out in December 2023-90 days supply.  Patient will have to contact her pharmacist.

## 2022-10-23 NOTE — Telephone Encounter (Signed)
tried to call pt but mail box was full and no message could be left.

## 2022-10-24 ENCOUNTER — Other Ambulatory Visit: Payer: Self-pay

## 2022-10-24 ENCOUNTER — Telehealth: Payer: Self-pay | Admitting: Family Medicine

## 2022-10-24 DIAGNOSIS — I10 Essential (primary) hypertension: Secondary | ICD-10-CM

## 2022-10-24 MED ORDER — PROPRANOLOL HCL ER 60 MG PO CP24: 60.0000 mg | ORAL_CAPSULE | Freq: Every day | ORAL | 3 refills | Status: DC

## 2022-10-24 NOTE — Telephone Encounter (Signed)
Patient called has [facility] Need all meds refill  Prescription Request  10/24/2022  Is this a "Controlled Substance" medicine? No  LOV: 10/11/2022  What is the name of the medication or equipment? amLODipine (NORVASC) 5 MG tablet [[id number]]   rosuvastatin (CRESTOR) 10 MG tablet [[id number]]   montelukast (SINGULAIR) 10 MG tablet [[id number]]   propranolol ER (INDERAL LA) 60 MG 24 hr capsule [[id number]]   Semaglutide-Weight Management (WEGOVY) 0.25 MG/0.5ML Darden Palmer [[id number]]   Have you contacted your pharmacy to request a refill? Yes   Which pharmacy would you like this sent to? [facility]    Patient notified that their request is being sent to the clinical staff for review and that they should receive a response within 2 business days.   Please advise at Mobile [phone] (mobile)   [facility]

## 2022-10-24 NOTE — Telephone Encounter (Signed)
Spoke with patient. Meds were just filled 2 weeks ago, pharmacy was updated.

## 2022-10-25 ENCOUNTER — Encounter: Payer: Self-pay | Admitting: Gastroenterology

## 2022-10-26 ENCOUNTER — Telehealth: Payer: Self-pay | Admitting: Family Medicine

## 2022-10-26 NOTE — Telephone Encounter (Addendum)
Patient needs Semaglutide-Weight Management (WEGOVY) 0.25 MG/0.5ML SOAJ  [facility] [place]   [facility] because [facility] will [facility] faster and notify patient when it is avaliable

## 2022-10-29 ENCOUNTER — Other Ambulatory Visit: Payer: Self-pay | Admitting: Family Medicine

## 2022-10-29 MED ORDER — WEGOVY 0.25 MG/0.5ML ~~LOC~~ SOAJ: 0.2500 mg | SUBCUTANEOUS | 0 refills | Status: DC

## 2022-10-30 ENCOUNTER — Other Ambulatory Visit: Payer: Self-pay | Admitting: Gastroenterology

## 2022-10-30 ENCOUNTER — Telehealth: Payer: Self-pay

## 2022-10-30 ENCOUNTER — Other Ambulatory Visit: Payer: Self-pay | Admitting: Family Medicine

## 2022-10-30 DIAGNOSIS — K219 Gastro-esophageal reflux disease without esophagitis: Secondary | ICD-10-CM

## 2022-10-30 MED ORDER — PANTOPRAZOLE SODIUM 40 MG PO TBEC: DELAYED_RELEASE_TABLET | ORAL | 5 refills | Status: DC

## 2022-10-30 NOTE — Telephone Encounter (Signed)
Pt called requesting refills on her pantoprazole. Pt was last seen on 04/18/2022. Pt is requesting that it be sent to [facility] [place].

## 2022-11-15 ENCOUNTER — Ambulatory Visit (INDEPENDENT_AMBULATORY_CARE_PROVIDER_SITE_OTHER): Payer: Medicaid Other | Admitting: Family Medicine

## 2022-11-15 ENCOUNTER — Encounter: Payer: Self-pay | Admitting: Family Medicine

## 2022-11-15 VITALS — BP 140/94 | HR 109 | Ht 65.0 in | Wt 262.1 lb

## 2022-11-15 DIAGNOSIS — I10 Essential (primary) hypertension: Secondary | ICD-10-CM | POA: Diagnosis not present

## 2022-11-15 DIAGNOSIS — R0683 Snoring: Secondary | ICD-10-CM | POA: Diagnosis not present

## 2022-11-15 DIAGNOSIS — G8929 Other chronic pain: Secondary | ICD-10-CM

## 2022-11-15 DIAGNOSIS — R7303 Prediabetes: Secondary | ICD-10-CM

## 2022-11-15 DIAGNOSIS — E785 Hyperlipidemia, unspecified: Secondary | ICD-10-CM

## 2022-11-15 DIAGNOSIS — M549 Dorsalgia, unspecified: Secondary | ICD-10-CM

## 2022-11-15 DIAGNOSIS — F411 Generalized anxiety disorder: Secondary | ICD-10-CM

## 2022-11-15 DIAGNOSIS — F321 Major depressive disorder, single episode, moderate: Secondary | ICD-10-CM

## 2022-11-15 MED ORDER — AMLODIPINE BESYLATE 10 MG PO TABS: 10.0000 mg | ORAL_TABLET | Freq: Every day | ORAL | 3 refills | Status: DC

## 2022-11-15 NOTE — Assessment & Plan Note (Signed)
  Patient re-educated about  the importance of commitment to a  minimum of 150 minutes of exercise per week as able.  The importance of healthy food choices with portion control discussed, as well as eating regularly and within a 12 hour window most days. The need to choose "clean , green" food 50 to 75% of the time is discussed, as well as to make water the primary drink and set a goal of 64 ounces water daily.       11/15/2022    9:58 AM 10/11/2022    9:35 AM 08/16/2022   11:24 AM  Weight /BMI  Weight 262 lb 1.3 oz 261 lb 1.3 oz 257 lb  Height '5\' 5"'$  (1.651 m) '5\' 5"'$  (1.651 m) '5\' 4"'$  (1.626 m)  BMI 43.61 kg/m2 43.45 kg/m2 44.11 kg/m2    Will start phentermine once BP is controlled

## 2022-11-15 NOTE — Patient Instructions (Addendum)
F/U in  2 to 3 weeks, re evaluate blood pressure, morning appointment please  BP is  high, new higher dose of amlodipine is 10 mg daily  Nurse please provide diet sheet 1500 cal   I recommend tylenol 500 mg up to three times daily for back pain if needed, weight loss will lessen pain significantly also  You are referred to pulmonary for evaluation for sleep apnea.Morning appt requested  Please change food choices of food and vegetable and stop potato chips and cookies also drink only water.  Please start exercising 30 minutes 5 days/week either inside the home or outside when able.  Thanks for choosing [facility], we consider it a privelige to serve you.

## 2022-11-15 NOTE — Progress Notes (Signed)
Karen Cox     MRN: [id number]      DOB: 14-Nov-1960   HPI Karen Cox is here for follow up and re-evaluation of chronic medical conditions, medication management and review of any available recent lab and radiology data.  Preventive health is updated, specifically  Cancer screening and Immunization.   Follows with Psych  Wants tablet for weight loss as injectable not approved, unfortunately BP still uncontrolled   ROS Denies recent fever or chills. Denies sinus pressure, nasal congestion, ear pain or sore throat. Denies chest congestion, productive cough or wheezing. Denies chest pains, palpitations and leg swelling Denies abdominal pain, nausea, vomiting,diarrhea or constipation.   Denies dysuria, frequency, hesitancy or incontinence. Denies headaches, seizures, numbness, or tingling. Denies uncontrolled  depression, anxiety or insomnia. Denies skin break down or rash.   PE  BP (!) 140/94   Pulse (!) 109   Ht '5\' 5"'$  (1.651 m)   Wt 262 lb 1.3 oz (118.9 kg)   LMP 06/27/2013   SpO2 96%   BMI 43.61 kg/m   Patient alert and oriented and in no cardiopulmonary distress.  HEENT: No facial asymmetry, EOMI,     Neck supple .  Chest: Clear to auscultation bilaterally.  CVS: S1, S2 no murmurs, no S3.Regular rate.  ABD: Soft non tender.   Ext: No edema  MS: Adequate ROM spine, shoulders, hips and knees.  Skin: Intact, no ulcerations or rash noted.  Psych: Good eye contact, normal affect. Memory intact not anxious or depressed appearing.  CNS: CN 2-12 intact, power,  normal throughout.no focal deficits noted.   Assessment & Plan  Snoring Snoring, fatigue , obesity refer for sleep eval  Essential hypertension Uncontrolled inc amlodipine dose to 10 mg daily DASH diet and commitment to daily physical activity for a minimum of 30 minutes discussed and encouraged, as a part of hypertension management. The importance of attaining a healthy weight is also  discussed.     11/15/2022   10:18 AM 11/15/2022    9:58 AM 10/11/2022   10:15 AM 10/11/2022   10:01 AM 10/11/2022    9:35 AM 08/16/2022   11:54 AM 08/16/2022   11:24 AM  BP/Weight  Systolic BP 263 335 456 256 389 373 428  Diastolic BP 94 96 84 86 93 82 82  Wt. (Lbs)  262.08   261.08  257  BMI  43.61 kg/m2   43.45 kg/m2  44.11 kg/m2     '  Morbid obesity [facility])  Patient re-educated about  the importance of commitment to a  minimum of 150 minutes of exercise per week as able.  The importance of healthy food choices with portion control discussed, as well as eating regularly and within a 12 hour window most days. The need to choose "clean , green" food 50 to 75% of the time is discussed, as well as to make water the primary drink and set a goal of 64 ounces water daily.       11/15/2022    9:58 AM 10/11/2022    9:35 AM 08/16/2022   11:24 AM  Weight /BMI  Weight 262 lb 1.3 oz 261 lb 1.3 oz 257 lb  Height '5\' 5"'$  (1.651 m) '5\' 5"'$  (1.651 m) '5\' 4"'$  (1.626 m)  BMI 43.61 kg/m2 43.45 kg/m2 44.11 kg/m2    Will start phentermine once BP is controlled  Prediabetes Patient educated about the importance of limiting  Carbohydrate intake , the need to commit to daily physical activity for  a minimum of 30 minutes , and to commit weight loss. The fact that changes in all these areas will reduce or eliminate all together the development of diabetes is stressed.      Latest Ref Rng & Units 10/11/2022   10:29 AM 08/16/2022    1:07 PM 02/16/2022   11:32 AM 07/25/2021   11:12 AM 04/21/2021    2:15 PM  Diabetic Labs  HbA1c 4.8 - 5.6 %  5.6  5.7  5.6    Chol 100 - 199 mg/dL  210  193   203   HDL >39 mg/dL  36  37   35   Calc LDL 0 - 99 mg/dL  139  129   130   Triglycerides 0 - 149 mg/dL  193  147   211   Creatinine 0.57 - 1.00 mg/dL 1.04  1.50  0.97   0.89       11/15/2022   10:18 AM 11/15/2022    9:58 AM 10/11/2022   10:15 AM 10/11/2022   10:01 AM 10/11/2022    9:35 AM 08/16/2022   11:54 AM 08/16/2022   11:24  AM  BP/Weight  Systolic BP 093 818 299 371 696 789 381  Diastolic BP 94 96 84 86 93 82 82  Wt. (Lbs)  262.08   261.08  257  BMI  43.61 kg/m2   43.45 kg/m2  44.11 kg/m2       No data to display            Hyperlipidemia LDL goal <100 Hyperlipidemia:Low fat diet discussed and encouraged.   Lipid Panel  Lab Results  Component Value Date   CHOL 210 (H) 08/16/2022   HDL 36 (L) 08/16/2022   LDLCALC 139 (H) 08/16/2022   TRIG 193 (H) 08/16/2022   CHOLHDL 5.8 (H) 08/16/2022     Needs to reduce fried and fatty foods  GAD (generalized anxiety disorder) Controlled and managed by Psych  Depression, major, single episode, moderate ([facility]) Controlled and managed by psych  Back pain Upper and lower back pain due to macromastia and obesity, has been trying to have breast reducton for years , with weight loss pain will improve and likely to have surgery, may use tylenol for pain in the interim

## 2022-11-15 NOTE — Assessment & Plan Note (Signed)
Snoring, fatigue , obesity refer for sleep eval

## 2022-11-15 NOTE — Assessment & Plan Note (Signed)
Controlled and managed by psych

## 2022-11-15 NOTE — Assessment & Plan Note (Signed)
Upper and lower back pain due to macromastia and obesity, has been trying to have breast reducton for years , with weight loss pain will improve and likely to have surgery, may use tylenol for pain in the interim

## 2022-11-15 NOTE — Assessment & Plan Note (Signed)
Controlled and managed by Psych

## 2022-11-15 NOTE — Assessment & Plan Note (Signed)
Patient educated about the importance of limiting  Carbohydrate intake , the need to commit to daily physical activity for a minimum of 30 minutes , and to commit weight loss. The fact that changes in all these areas will reduce or eliminate all together the development of diabetes is stressed.      Latest Ref Rng & Units 10/11/2022   10:29 AM 08/16/2022    1:07 PM 02/16/2022   11:32 AM 07/25/2021   11:12 AM 04/21/2021    2:15 PM  Diabetic Labs  HbA1c 4.8 - 5.6 %  5.6  5.7  5.6    Chol 100 - 199 mg/dL  210  193   203   HDL >39 mg/dL  36  37   35   Calc LDL 0 - 99 mg/dL  139  129   130   Triglycerides 0 - 149 mg/dL  193  147   211   Creatinine 0.57 - 1.00 mg/dL 1.04  1.50  0.97   0.89       11/15/2022   10:18 AM 11/15/2022    9:58 AM 10/11/2022   10:15 AM 10/11/2022   10:01 AM 10/11/2022    9:35 AM 08/16/2022   11:54 AM 08/16/2022   11:24 AM  BP/Weight  Systolic BP 997 741 423 953 202 334 356  Diastolic BP 94 96 84 86 93 82 82  Wt. (Lbs)  262.08   261.08  257  BMI  43.61 kg/m2   43.45 kg/m2  44.11 kg/m2       No data to display

## 2022-11-15 NOTE — Assessment & Plan Note (Signed)
Hyperlipidemia:Low fat diet discussed and encouraged.   Lipid Panel  Lab Results  Component Value Date   CHOL 210 (H) 08/16/2022   HDL 36 (L) 08/16/2022   LDLCALC 139 (H) 08/16/2022   TRIG 193 (H) 08/16/2022   CHOLHDL 5.8 (H) 08/16/2022     Needs to reduce fried and fatty foods

## 2022-11-15 NOTE — Assessment & Plan Note (Signed)
Uncontrolled inc amlodipine dose to 10 mg daily DASH diet and commitment to daily physical activity for a minimum of 30 minutes discussed and encouraged, as a part of hypertension management. The importance of attaining a healthy weight is also discussed.     11/15/2022   10:18 AM 11/15/2022    9:58 AM 10/11/2022   10:15 AM 10/11/2022   10:01 AM 10/11/2022    9:35 AM 08/16/2022   11:54 AM 08/16/2022   11:24 AM  BP/Weight  Systolic BP 836 629 476 546 503 546 568  Diastolic BP 94 96 84 86 93 82 82  Wt. (Lbs)  262.08   261.08  257  BMI  43.61 kg/m2   43.45 kg/m2  44.11 kg/m2     '

## 2022-11-19 ENCOUNTER — Ambulatory Visit: Payer: Medicaid Other | Admitting: Gastroenterology

## 2022-11-20 ENCOUNTER — Telehealth: Payer: Self-pay | Admitting: Psychiatry

## 2022-11-20 ENCOUNTER — Telehealth: Payer: Self-pay

## 2022-11-20 NOTE — Telephone Encounter (Signed)
Patient is not due for for any of these medications yet, she could requested through her pharmacy when she is due.  She has enough at least till mid March and for the Wellbutrin she has refills also.

## 2022-11-20 NOTE — Telephone Encounter (Signed)
Patient called to reschedule her appointment on 2-15-2. She did not arrange transportation. Her next appointment is 01-14-23 and she is wanting to know about her prescriptions. She is not sure if she needs any filled at this time.

## 2022-11-20 NOTE — Telephone Encounter (Signed)
Patient is requesting  refill for the following medications    QUEtiapine (SEROQUEL XR) 50 MG TB24 24 hr tablet   Disp Refills Start End   PARoxetine (PAXIL) 30 MG tablet         Disp Refills Start End   hydrOXYzine (ATARAX) 25 MG tablet        [id number] [place] [place] [phone] Fax: [phone]

## 2022-11-20 NOTE — Telephone Encounter (Signed)
I do not think she is currently due for any medication refills.  However when she is getting close to running out she could request it through her pharmacy.

## 2022-11-21 NOTE — Telephone Encounter (Signed)
Noted  

## 2022-11-22 ENCOUNTER — Ambulatory Visit: Payer: Medicaid Other | Admitting: Psychiatry

## 2022-11-27 ENCOUNTER — Ambulatory Visit (HOSPITAL_COMMUNITY): Payer: Medicaid Other | Admitting: Licensed Clinical Social Worker

## 2022-11-29 ENCOUNTER — Ambulatory Visit (INDEPENDENT_AMBULATORY_CARE_PROVIDER_SITE_OTHER): Payer: Medicaid Other | Admitting: Family Medicine

## 2022-11-29 ENCOUNTER — Encounter: Payer: Self-pay | Admitting: Family Medicine

## 2022-11-29 VITALS — BP 120/84 | HR 99 | Ht 65.0 in | Wt 266.1 lb

## 2022-11-29 DIAGNOSIS — E785 Hyperlipidemia, unspecified: Secondary | ICD-10-CM

## 2022-11-29 DIAGNOSIS — I10 Essential (primary) hypertension: Secondary | ICD-10-CM | POA: Diagnosis not present

## 2022-11-29 DIAGNOSIS — G8929 Other chronic pain: Secondary | ICD-10-CM

## 2022-11-29 DIAGNOSIS — M549 Dorsalgia, unspecified: Secondary | ICD-10-CM

## 2022-11-29 MED ORDER — PHENTERMINE HCL 15 MG PO CAPS: 15.0000 mg | ORAL_CAPSULE | ORAL | 1 refills | Status: DC

## 2022-11-29 NOTE — Patient Instructions (Signed)
F/U in 7 to 8 weeks re evaluate weight and blood pressure  Please get fasting lipid , cmp and EGFr for next visit, if able to get it drawn the week before we will discuss at visit  Propranolol 60 mg one daily has your blood pressure conntrolled New is phentermine 15 mg one daily to help with apetite to lose weight  Thanks for choosing [facility], we consider it a privelige to serve you.

## 2022-11-29 NOTE — Assessment & Plan Note (Signed)
  Patient re-educated about  the importance of commitment to a  minimum of 150 minutes of exercise per week as able.  The importance of healthy food choices with portion control discussed, as well as eating regularly and within a 12 hour window most days. The need to choose "clean , green" food 50 to 75% of the time is discussed, as well as to make water the primary drink and set a goal of 64 ounces water daily.       11/29/2022    9:58 AM 11/15/2022    9:58 AM 10/11/2022    9:35 AM  Weight /BMI  Weight 266 lb 1.9 oz 262 lb 1.3 oz 261 lb 1.3 oz  Height 5' 5"$  (1.651 m) 5' 5"$  (1.651 m) 5' 5"$  (1.651 m)  BMI 44.28 kg/m2 43.61 kg/m2 43.45 kg/m2    Deteriorated , start phentermine 15 mg daily

## 2022-11-29 NOTE — Assessment & Plan Note (Signed)
Updated lab needed at/ before next visit. Low fat diet discussed and encouraged

## 2022-11-29 NOTE — Progress Notes (Signed)
Karen Cox     MRN: [id number]      DOB: 10/27/1960   HPI Karen Cox is here for follow up and re-evaluation of chronic medical conditions, in particular uncontrolled HTN and obesity Denies intolerance of medication, only taking one pill for BP. C/p weight gain anxious t ostart appetite suppressant C/I mid and upper back pain, has macromastia and  has been trying to have surgery ROS Denies recent fever or chills. Denies sinus pressure, nasal congestion, ear pain or sore throat. Denies chest congestion, productive cough or wheezing. Denies chest pains, palpitations and leg swelling Denies abdominal pain, nausea, vomiting,diarrhea or constipation.   Denies dysuria, frequency, hesitancy or incontinence. Denies joint pain, swelling and limitation in mobility. Denies headaches, seizures, numbness, or tingling. Denies uncontrolled depression, anxiety or insomnia.Treated by Psych as well as Therapist Denies skin break down or rash.   PE  BP 120/84   Pulse 99   Ht 5' 5"$  (1.651 m)   Wt 266 lb 1.9 oz (120.7 kg)   LMP 06/27/2013   SpO2 96%   BMI 44.28 kg/m   Patient alert and oriented and in no cardiopulmonary distress.  HEENT: No facial asymmetry, EOMI,     Neck supple .  Chest: Clear to auscultation bilaterally.  CVS: S1, S2 no murmurs, no S3.Regular rate.  ABD: Soft non tender.   Ext: No edema  MS: Adequate ROM spine, shoulders, hips and knees.  Skin: Intact, no ulcerations or rash noted.  Psych: Good eye contact, normal affect. Memory intact not anxious or depressed appearing.  CNS: CN 2-12 intact, power,  normal throughout.no focal deficits noted.   Assessment & Plan Essential hypertension Controlled, no change in medication DASH diet and commitment to daily physical activity for a minimum of 30 minutes discussed and encouraged, as a part of hypertension management. The importance of attaining a healthy weight is also discussed.     11/29/2022    10:13 AM 11/29/2022    9:58 AM 11/15/2022   10:18 AM 11/15/2022    9:58 AM 10/11/2022   10:15 AM 10/11/2022   10:01 AM 10/11/2022    9:35 AM  BP/Weight  Systolic BP 123456 123456 [id number]  Diastolic BP 84 85 94 96 84 86 93  Wt. (Lbs)  266.12  262.08   261.08  BMI  44.28 kg/m2  43.61 kg/m2   43.45 kg/m2       Morbid obesity (HCC)  Patient re-educated about  the importance of commitment to a  minimum of 150 minutes of exercise per week as able.  The importance of healthy food choices with portion control discussed, as well as eating regularly and within a 12 hour window most days. The need to choose "clean , green" food 50 to 75% of the time is discussed, as well as to make water the primary drink and set a goal of 64 ounces water daily.       11/29/2022    9:58 AM 11/15/2022    9:58 AM 10/11/2022    9:35 AM  Weight /BMI  Weight 266 lb 1.9 oz 262 lb 1.3 oz 261 lb 1.3 oz  Height 5' 5"$  (1.651 m) 5' 5"$  (1.651 m) 5' 5"$  (1.651 m)  BMI 44.28 kg/m2 43.61 kg/m2 43.45 kg/m2    Deteriorated , start phentermine 15 mg daily  Back pain Chronic mid back pain, advised tylenol ,topical rubs and  massages  Hyperlipidemia LDL goal <100 Updated lab needed at/ before next  visit. Low fat diet discussed and encouraged

## 2022-11-29 NOTE — Assessment & Plan Note (Signed)
Controlled, no change in medication DASH diet and commitment to daily physical activity for a minimum of 30 minutes discussed and encouraged, as a part of hypertension management. The importance of attaining a healthy weight is also discussed.     11/29/2022   10:13 AM 11/29/2022    9:58 AM 11/15/2022   10:18 AM 11/15/2022    9:58 AM 10/11/2022   10:15 AM 10/11/2022   10:01 AM 10/11/2022    9:35 AM  BP/Weight  Systolic BP 123456 123456 [id number]  Diastolic BP 84 85 94 96 84 86 93  Wt. (Lbs)  266.12  262.08   261.08  BMI  44.28 kg/m2  43.61 kg/m2   43.45 kg/m2

## 2022-11-29 NOTE — Assessment & Plan Note (Signed)
Chronic mid back pain, advised tylenol ,topical rubs and  massages

## 2022-12-07 ENCOUNTER — Telehealth: Payer: Self-pay

## 2022-12-07 DIAGNOSIS — F251 Schizoaffective disorder, depressive type: Secondary | ICD-10-CM

## 2022-12-07 DIAGNOSIS — F411 Generalized anxiety disorder: Secondary | ICD-10-CM

## 2022-12-07 MED ORDER — QUETIAPINE FUMARATE ER 50 MG PO TB24: 100.0000 mg | ORAL_TABLET | Freq: Every day | ORAL | 0 refills | Status: DC

## 2022-12-07 MED ORDER — BUPROPION HCL ER (XL) 300 MG PO TB24: 300.0000 mg | ORAL_TABLET | Freq: Every morning | ORAL | 1 refills | Status: DC

## 2022-12-07 MED ORDER — PAROXETINE HCL 30 MG PO TABS: 30.0000 mg | ORAL_TABLET | Freq: Every day | ORAL | 0 refills | Status: DC

## 2022-12-07 NOTE — Telephone Encounter (Signed)
Patient called stating that she needs refills for the following medications as I looked in the chart the last refills were sent to [place]  patient stated that she no longer use this pharmacy. Called [place] spoke to Melody she stated that the patient had not picked up any of the medication since  09/03/22 (wellbutrin) 09/19/22 (seroquel) 09/24/22 (paxil) requested to cancel rx for these she voice understanding. Patient advise me to remove [place] from chart she is currently using  [facility] [place] [facility]  Last visit 11/20/22 Next visit 01/14/23   Disp Refills Start End   buPROPion (WELLBUTRIN XL) 300 MG 24 hr tablet 90 tablet 1 1/16/2    PARoxetine (PAXIL) 30 MG tablet  QUEtiapine (SEROQUEL XR) 50 MG TB24 24 hr tablet   [id number] [place] [place] [address] [facility]: [phone] Fax: [phone]

## 2022-12-07 NOTE — Addendum Note (Signed)
Addended byUrsula Alert on: 12/07/2022 11:19 AM   Modules accepted: Orders

## 2022-12-07 NOTE — Telephone Encounter (Signed)
I had send Seroquel XR 2 tablets of 50 mg daily at bedtime, Wellbutrin 300 mg and Paxil 30 mg to [facility] [place].

## 2022-12-18 ENCOUNTER — Ambulatory Visit: Payer: Medicaid Other | Admitting: Family Medicine

## 2022-12-20 ENCOUNTER — Ambulatory Visit: Payer: Medicaid Other | Admitting: Gastroenterology

## 2023-01-08 ENCOUNTER — Ambulatory Visit: Payer: Medicaid Other | Admitting: Gastroenterology

## 2023-01-08 NOTE — Progress Notes (Unsigned)
GI Office Note    Referring Provider: Fayrene Helper, MD Primary Care Physician:  Fayrene Helper, MD Primary Gastroenterologist: Elon Alas. Abbey Chatters, DO  Date:  01/09/2023  ID:  Karen Cox, DOB [birth year], MRN [id number]   Chief Complaint   Chief Complaint  Patient presents with   Colonoscopy    Ov before colonoscopy   History of Present Illness  Karen Cox is a 62 y.o. female with a history of anxiety, depression, GERD, and HLD presenting today to schedule colonoscopy.  Last colonoscopy 2013: -Tubular adenoma removed -Repeat in 10 years.  Last office visit 04/18/22.  Taking pantoprazole 40 mg with 2 tablets in the morning.  Not working well taking 1 in the morning 1 at night.  Has burning that times a week Serpe melanite and does have some regurgitation.  Feels like flames in her throat.  Uses ice or ice water to help soothe her throat.  Denied any dysphagia.  Bowel movement 1-2 times per week does have to strain.  Also with some incomplete emptying.  Uses Pepto-Bismol at times to see if this helps.  Stools are usually firm.  Denies any melena or hematochezia. Scheduled for EGD and Colonoscopy. PPI BID, famotidine 20 mg nightly. Use miralax 1 capful nightly. GERD diet.   Today: GERD - doing alright as long as she takes her medication. Has burning at night if she forgets to take her medication. Sometimes she may eat fruits or crackers at night before laying down. Will sometimes have to use ice for relief. At times it makes her feel like she wants to vomit. Symptoms worsen with carbonated beverages.   Has some constipation at times. Feels like she wants to go but unable to go. Stools alternate from hard to soft. Does have to strain. No taking anything otc right now.   No chest pain, shortness of breath, edema. No melena or brbpr. Has a good appetite. Taking phentermine. Just staretd recently to help with appetite to lose weight.   Has had 2 cousins with  stomach cancer and one with colon cancer.     Current Outpatient Medications  Medication Sig Dispense Refill   buPROPion (WELLBUTRIN XL) 300 MG 24 hr tablet Take 1 tablet (300 mg total) by mouth in the morning. 90 tablet 1   PARoxetine (PAXIL) 30 MG tablet Take 1 tablet (30 mg total) by mouth daily. 90 tablet 0   phentermine 15 MG capsule Take 1 capsule (15 mg total) by mouth every morning. 30 capsule 1   propranolol ER (INDERAL LA) 60 MG 24 hr capsule Take 1 capsule (60 mg total) by mouth daily. 90 capsule 3   QUEtiapine (SEROQUEL XR) 50 MG TB24 24 hr tablet Take 2 tablets (100 mg total) by mouth at bedtime. 180 tablet 0   rosuvastatin (CRESTOR) 10 MG tablet Take 1 tablet (10 mg total) by mouth daily. 90 tablet 1   Vitamin D3 (VITAMIN D) 25 MCG tablet Take 1,000 Units by mouth daily.     pantoprazole (PROTONIX) 40 MG tablet TAKE 1 TABLET BY MOUTH TWICE A DAY BEFORE A MEAL. 180 tablet 3   Current Facility-Administered Medications  Medication Dose Route Frequency Provider Last Rate Last Admin   omalizumab Arvid Right) injection 300 mg  300 mg Subcutaneous Q28 days Valentina Shaggy, MD   300 mg at 12/31/17 1035    Past Medical History:  Diagnosis Date   Allergy    Anxiety    Cervical high risk HPV (  human papillomavirus) test positive 05/17/2014   Depression    Eczema    GERD (gastroesophageal reflux disease)    Hot flashes 09/01/2013   Hyperlipidemia    Mild mental slowing    Peri-menopause 08/05/2013   PMB (postmenopausal bleeding) 11/16/2013   Urticaria    Vaginal itching 08/05/2013   Yeast infection 08/05/2013    Past Surgical History:  Procedure Laterality Date   CERVICAL POLYPECTOMY N/A 01/06/2014   Procedure: CERVICAL POLYPECTOMY;  Surgeon: Florian Buff, MD;  Location: [facility];  Service: Gynecology;  Laterality: N/A;   COLONOSCOPY  09/19/2012   3 mm sessile polyp removed from the colon (simple adenoma).  Advised 10-year follow-up colonoscopy   DILITATION &  CURRETTAGE/HYSTROSCOPY WITH THERMACHOICE ABLATION N/A 01/06/2014   Procedure: DILATATION & CURETTAGE/HYSTEROSCOPY WITH THERMACHOICE ABLATION;  Surgeon: Florian Buff, MD;  Location: [facility];  Service: Gynecology;  Laterality: N/A;  total therapy time:  9 minutes 12 seconds ,     temperature:87 degrees    Family History  Problem Relation Age of Onset   Diabetes Father    Heart disease Father    Hypertension Father    Hyperlipidemia Father    Arthritis Maternal Grandmother    Anxiety disorder Maternal Grandmother    Heart disease Maternal Grandfather        pacemaker   Anxiety disorder Daughter    Diabetes Maternal Uncle    Anemia Maternal Uncle    Arthritis Maternal Uncle    Anxiety disorder Maternal Uncle    Colon cancer Cousin    Stomach cancer Cousin    Cancer Neg Hx    Allergic rhinitis Neg Hx    Angioedema Neg Hx    Asthma Neg Hx    Eczema Neg Hx    Immunodeficiency Neg Hx    Urticaria Neg Hx    Atopy Neg Hx     Allergies as of 01/09/2023 - Review Complete 01/09/2023  Allergen Reaction Noted   Benadryl  [diphenhydramine hcl (sleep)]     Benadryl [diphenhydramine hcl] Other (See Comments) 12/31/2010   Dextromethorphan hbr Hives 05/29/2016   Robitussin (alcohol free) [guaifenesin]  05/17/2014   Robitussin dm max day-night Other (See Comments) 06/26/2022   Aspirin Rash and Hives 05/08/2011   Diphenhydramine hcl Hives and Rash 03/01/2016   Latex Rash 09/04/2011    Social History   Socioeconomic History   Marital status: Single    Spouse name: Not on file   Number of children: 1   Years of education: Not on file   Highest education level: High school graduate  Occupational History   Not on file  Tobacco Use   Smoking status: Former    Packs/day: 0.25    Years: 3.00    Additional pack years: 0.00    Total pack years: 0.75    Types: Cigarettes    Quit date: 09/03/1981    Years since quitting: 41.3   Smokeless tobacco: Never  Vaping Use   Vaping Use: Never  used  Substance and Sexual Activity   Alcohol use: Yes    Comment: occ   Drug use: No   Sexual activity: Not Currently    Birth control/protection: Post-menopausal  Other Topics Concern   Not on file  Social History Narrative   Not on file   Social Determinants of Health   Financial Resource Strain: Not on file  Food Insecurity: Not on file  Transportation Needs: Not on file  Physical Activity: Not on file  Stress: Not  on file  Social Connections: Not on file     Review of Systems   Gen: Denies fever, chills, anorexia. Denies fatigue, weakness, weight loss.  CV: Denies chest pain, palpitations, syncope, peripheral edema, and claudication. Resp: Denies dyspnea at rest, cough, wheezing, coughing up blood, and pleurisy. GI: See HPI Derm: Denies rash, itching, dry skin Psych: Denies depression, anxiety, memory loss, confusion. No homicidal or suicidal ideation.  Heme: Denies bruising, bleeding, and enlarged lymph nodes.   Physical Exam   BP 110/74   Pulse 89   Temp 97.8 F (36.6 C)   Ht 5\' 5"  (1.651 m)   Wt 270 lb 3.2 oz (122.6 kg)   LMP 06/27/2013   BMI 44.96 kg/m   General:   Alert and oriented. No distress noted. Pleasant and cooperative.  Head:  Normocephalic and atraumatic. Eyes:  Conjuctiva clear without scleral icterus. Mouth:  Oral mucosa pink and moist. Good dentition. No lesions. Lungs:  Clear to auscultation bilaterally. No wheezes, rales, or rhonchi. No distress.  Heart:  S1, S2 present without murmurs appreciated.  Abdomen:  +BS, soft, non-tender and non-distended. No rebound or guarding. No HSM or masses noted. Rectal: deferred Msk:  Symmetrical without gross deformities. Normal posture. Extremities:  Without edema. Neurologic:  Alert and  oriented x4 Psych:  Alert and cooperative. Normal mood and affect.   Assessment  Karen Cox is a 62 y.o. female with a history of anxiety, depression, GERD, and HLD presenting today to schedule  colonoscopy.  History of colon polyps: Last colonoscopy in 2013 with tubular adenoma removed.  Currently overdue.  Originally scheduled last year but had difficulties with finding a ride to get scheduled.  Has some constipation as noted below.  Denies any melena, BRBPR, lack of appetite, early satiety, unintentional weight loss.   Constipation: Only has about 1-2 bowel movements per week.  Stools most of the time are firm and does have to strain.  Has some incomplete emptying and at times she feels the need to use the bathroom but then is unable to go.  Not currently taking anything over-the-counter to help with constipation.  She has not done previous recommendations with MiraLAX or high-fiber diet.  Currently is on phentermine to help with weight loss.  Advise she need to continue regular exercise and that she would benefit from MiraLAX once daily.  GERD: Has been taking pantoprazole 40 mg twice daily.  Has significant symptoms if she forgets to take her medication or she runs out.  Not currently taking anything for breakthrough however multiple times per week she is having nocturnal symptoms primarily related to body habitus and dietary factors.  Admits to eating citrus fruits or drinking OJ or eating other junk food just prior to laying down at night.  At times uses ice to help soothe her throat due to the burning.  Given her ongoing symptoms we will evaluate with an upper endoscopy for gastritis, esophagitis, duodenitis.  She denies any nausea, vomiting, or dysphagia.  Unfortunately she is not a candidate for a TIF procedure or other antireflux surgery given her BMI.  PLAN   Continue pantoprazole 40 mg BID (90 day supply sent) Famotidine 20 mg nightly.  GERD diet/lifestyle modifications Start miralax 17g once daily.  Proceed with upper endoscopy and colonoscopy with propofol by Dr. Abbey Chatters in near future: the risks, benefits, and alternatives have been discussed with the patient in detail. The  patient states understanding and desires to proceed. ASA 3 (patient request procedure and  may, contact her daughter to schedule) Extra half day of clear liquids Linzess 145 mcg for 3 days prior.  Given 72 mcg samples which she will need to take 2 of these. Hold phentermine for 7 days Follow-up in 3 months     Venetia Night, MSN, FNP-BC, AGACNP-BC [facility]

## 2023-01-09 ENCOUNTER — Ambulatory Visit (INDEPENDENT_AMBULATORY_CARE_PROVIDER_SITE_OTHER): Payer: Medicaid Other | Admitting: Gastroenterology

## 2023-01-09 ENCOUNTER — Encounter: Payer: Self-pay | Admitting: Gastroenterology

## 2023-01-09 VITALS — BP 110/74 | HR 89 | Temp 97.8°F | Ht 65.0 in | Wt 270.2 lb

## 2023-01-09 DIAGNOSIS — K219 Gastro-esophageal reflux disease without esophagitis: Secondary | ICD-10-CM

## 2023-01-09 DIAGNOSIS — K5900 Constipation, unspecified: Secondary | ICD-10-CM | POA: Diagnosis not present

## 2023-01-09 DIAGNOSIS — Z86010 Personal history of colonic polyps: Secondary | ICD-10-CM | POA: Diagnosis not present

## 2023-01-09 MED ORDER — PANTOPRAZOLE SODIUM 40 MG PO TBEC: DELAYED_RELEASE_TABLET | ORAL | 3 refills | Status: DC

## 2023-01-09 NOTE — Patient Instructions (Addendum)
We are going to schedule you for endoscopy and colonoscopy in the near future with Dr. Abbey Chatters.  Continue taking your pantoprazole 40 mg twice daily. For breakthrough symptoms I want you to start taking famotidine (Pepcid) 20 mg once nightly.  Follow a GERD diet:  Avoid fried, fatty, greasy, spicy, citrus foods and beverages, tomato based products. Avoid caffeine and carbonated beverages. Avoid chocolate. Try eating 4-6 small meals a day rather than 3 large meals. Do not eat within 2-3 hours of laying down. Prop head of bed up on wood or bricks to create a 6 inch incline.  Start taking MiraLAX 17 g (1 capful) daily in 8 ounces of fluid of your choice.  It was a pleasure to see you today. I want to create trusting relationships with patients. If you receive a survey regarding your visit,  I greatly appreciate you taking time to fill this out on paper or through your MyChart. I value your feedback.  Venetia Night, MSN, FNP-BC, AGACNP-BC [facility]

## 2023-01-10 ENCOUNTER — Institutional Professional Consult (permissible substitution): Payer: Medicaid Other | Admitting: Pulmonary Disease

## 2023-01-10 ENCOUNTER — Encounter: Payer: Self-pay | Admitting: *Deleted

## 2023-01-14 ENCOUNTER — Encounter: Payer: Self-pay | Admitting: *Deleted

## 2023-01-14 ENCOUNTER — Ambulatory Visit: Payer: Medicaid Other | Admitting: Psychiatry

## 2023-01-17 ENCOUNTER — Ambulatory Visit: Payer: Medicaid Other | Admitting: Family Medicine

## 2023-01-25 ENCOUNTER — Other Ambulatory Visit: Payer: Self-pay | Admitting: Family Medicine

## 2023-01-25 ENCOUNTER — Encounter: Payer: Self-pay | Admitting: Family Medicine

## 2023-01-25 ENCOUNTER — Ambulatory Visit (INDEPENDENT_AMBULATORY_CARE_PROVIDER_SITE_OTHER): Payer: Medicaid Other | Admitting: Family Medicine

## 2023-01-25 VITALS — BP 127/84 | HR 83 | Resp 16 | Ht 65.0 in | Wt 265.0 lb

## 2023-01-25 DIAGNOSIS — F411 Generalized anxiety disorder: Secondary | ICD-10-CM

## 2023-01-25 DIAGNOSIS — Z23 Encounter for immunization: Secondary | ICD-10-CM | POA: Diagnosis not present

## 2023-01-25 DIAGNOSIS — Z780 Asymptomatic menopausal state: Secondary | ICD-10-CM

## 2023-01-25 DIAGNOSIS — F321 Major depressive disorder, single episode, moderate: Secondary | ICD-10-CM

## 2023-01-25 DIAGNOSIS — I10 Essential (primary) hypertension: Secondary | ICD-10-CM

## 2023-01-25 MED ORDER — ORLISTAT 120 MG PO CAPS: 120.0000 mg | ORAL_CAPSULE | Freq: Three times a day (TID) | ORAL | 5 refills | Status: DC

## 2023-01-25 MED ORDER — PHENTERMINE HCL 15 MG PO CAPS
15.0000 mg | ORAL_CAPSULE | ORAL | 1 refills | Status: DC
Start: 2023-01-27 — End: 2023-03-20

## 2023-01-25 NOTE — Patient Instructions (Signed)
F/U in 8 weeks, call if you need me sooner  Labs today  Please give 1500 calorie diet sheet  New additional medication for weight loss Xenical 1 capsule 3 times daily , take with meal, if you eat fatty food, you will have a bowel movement on yourself, so it helps you to change to healthy low fat eating, which will help wit weight loss  Continue phentermine capsule one every morning to help to curb appetite It is important that you exercise regularly at least 30 minutes 5 times a week. If you develop chest pain, have severe difficulty breathing, or feel very tired, stop exercising immediately and seek medical attention    Thanks for choosing [facility], we consider it a privelige to serve you.

## 2023-01-26 LAB — CMP14+EGFR
ALT: 16 IU/L (ref 0–32)
AST: 17 IU/L (ref 0–40)
Albumin/Globulin Ratio: 1.7 (ref 1.2–2.2)
Albumin: 4.5 g/dL (ref 3.8–4.9)
Alkaline Phosphatase: 128 IU/L — ABNORMAL HIGH (ref 44–121)
BUN/Creatinine Ratio: 9 — ABNORMAL LOW (ref 12–28)
BUN: 9 mg/dL (ref 8–27)
Bilirubin Total: 0.7 mg/dL (ref 0.0–1.2)
CO2: 22 mmol/L (ref 20–29)
Calcium: 9.9 mg/dL (ref 8.7–10.3)
Chloride: 105 mmol/L (ref 96–106)
Creatinine, Ser: 1.01 mg/dL — ABNORMAL HIGH (ref 0.57–1.00)
Globulin, Total: 2.7 g/dL (ref 1.5–4.5)
Glucose: 91 mg/dL (ref 70–99)
Potassium: 4.7 mmol/L (ref 3.5–5.2)
Sodium: 140 mmol/L (ref 134–144)
Total Protein: 7.2 g/dL (ref 6.0–8.5)
eGFR: 64 mL/min/{1.73_m2} (ref >59)

## 2023-01-26 LAB — LIPID PANEL
Chol/HDL Ratio: 3 ratio (ref 0.0–4.4)
Cholesterol, Total: 125 mg/dL (ref 100–199)
HDL: 41 mg/dL (ref >39)
LDL Chol Calc (NIH): 62 mg/dL (ref 0–99)
Triglycerides: 126 mg/dL (ref 0–149)
VLDL Cholesterol Cal: 22 mg/dL (ref 5–40)

## 2023-01-28 ENCOUNTER — Encounter: Payer: Self-pay | Admitting: Family Medicine

## 2023-01-28 NOTE — Assessment & Plan Note (Signed)
Controlled, no change in medication Managed by Psych 

## 2023-01-28 NOTE — Progress Notes (Signed)
Karen Cox     MRN: [id number]      DOB: 12/30/1960   HPI Karen Cox is here for follow up and re-evaluation of chronic medical conditions, medication management and review of any available recent lab and radiology data.  Preventive health is updated, specifically  Cancer screening and Immunization.   Questions or concerns regarding consultations or procedures which the PT has had in the interim are  addressed. The PT denies any adverse reactions to current medications since the last visit.  There are no new concerns.  There are no specific complaints   ROS Denies recent fever or chills. Denies sinus pressure, nasal congestion, ear pain or sore throat. Denies chest congestion, productive cough or wheezing. Denies chest pains, palpitations and leg swelling Denies abdominal pain, nausea, vomiting,diarrhea or constipation.   Denies dysuria, frequency, hesitancy or incontinence. Denies joint pain, swelling and limitation in mobility. Denies headaches, seizures, numbness, or tingling. Denies depression, anxiety or insomnia. Denies skin break down or rash.   PE  BP 127/84   Pulse 83   Resp 16   Ht  (1.651 m)   Wt 265 lb (120.2 kg)   LMP 06/27/2013   SpO2 97%   BMI 44.10 kg/m   Patient alert and oriented and in no cardiopulmonary distress.  HEENT: No facial asymmetry, EOMI,     Neck supple .  Chest: Clear to auscultation bilaterally.  CVS: S1, S2 no murmurs, no S3.Regular rate.  ABD: Soft non tender.   Ext: No edema  MS: Adequate ROM spine, shoulders, hips and knees.  Skin: Intact, no ulcerations or rash noted.  Psych: Good eye contact, normal affect. Memory intact not anxious or depressed appearing.  CNS: CN 2-12 intact, power,  normal throughout.no focal deficits noted.   Assessment & Plan  Essential hypertension Controlled, no change in medication DASH diet and commitment to daily physical activity for a minimum of 30 minutes discussed  and encouraged, as a part of hypertension management. The importance of attaining a healthy weight is also discussed.     01/25/2023    9:25 AM 01/09/2023    9:29 AM 11/29/2022   10:13 AM 11/29/2022    9:58 AM 11/15/2022   10:18 AM 11/15/2022    9:58 AM 10/11/2022   10:15 AM  BP/Weight  Systolic BP 127 110 120 120 140 138 124  Diastolic BP 84 74 84 85 94 96 84  Wt. (Lbs) 265 270.2  266.12  262.08   BMI 44.1 kg/m2 44.96 kg/m2  44.28 kg/m2  43.61 kg/m2        Morbid obesity (HCC)  Patient re-educated about  the importance of commitment to a  minimum of 150 minutes of exercise per week as able.  The importance of healthy food choices with portion control discussed, as well as eating regularly and within a 12 hour window most days. The need to choose "clean , green" food 50 to 75% of the time is discussed, as well as to make water the primary drink and set a goal of 64 ounces water daily.       01/25/2023    9:25 AM 01/09/2023    9:29 AM 11/29/2022    9:58 AM  Weight /BMI  Weight 265 lb 270 lb 3.2 oz 266 lb 1.9 oz  Height  (1.651 m)  (1.651 m)  (1.651 m)  BMI 44.1 kg/m2 44.96 kg/m2 44.28 kg/m2    No major change ad xenical  Postmenopause Hyperlipidemia:Low fat diet discussed and encouraged.   Lipid Panel  Lab Results  Component Value Date   CHOL 125 01/25/2023   HDL 41 01/25/2023   LDLCALC 62 01/25/2023   TRIG 126 01/25/2023   CHOLHDL 3.0 01/25/2023     Controlled, no change in medication   GAD (generalized anxiety disorder) Controlled, no change in medication Managed by Psych  Depression, major, single episode, moderate (HCC) Controlled by Psych

## 2023-01-28 NOTE — Assessment & Plan Note (Signed)
Controlled, no change in medication DASH diet and commitment to daily physical activity for a minimum of 30 minutes discussed and encouraged, as a part of hypertension management. The importance of attaining a healthy weight is also discussed.     01/25/2023    9:25 AM 01/09/2023    9:29 AM 11/29/2022   10:13 AM 11/29/2022    9:58 AM 11/15/2022   10:18 AM 11/15/2022    9:58 AM 10/11/2022   10:15 AM  BP/Weight  Systolic BP 127 110 120 120 140 138 124  Diastolic BP 84 74 84 85 94 96 84  Wt. (Lbs) 265 270.2  266.12  262.08   BMI 44.1 kg/m2 44.96 kg/m2  44.28 kg/m2  43.61 kg/m2

## 2023-01-28 NOTE — Assessment & Plan Note (Signed)
Hyperlipidemia:Low fat diet discussed and encouraged.   Lipid Panel  Lab Results  Component Value Date   CHOL 125 01/25/2023   HDL 41 01/25/2023   LDLCALC 62 01/25/2023   TRIG 126 01/25/2023   CHOLHDL 3.0 01/25/2023     Controlled, no change in medication

## 2023-01-28 NOTE — Assessment & Plan Note (Signed)
Controlled by Psych

## 2023-01-28 NOTE — Assessment & Plan Note (Addendum)
  Patient re-educated about  the importance of commitment to a  minimum of 150 minutes of exercise per week as able.  The importance of healthy food choices with portion control discussed, as well as eating regularly and within a 12 hour window most days. The need to choose "clean , green" food 50 to 75% of the time is discussed, as well as to make water the primary drink and set a goal of 64 ounces water daily.       01/25/2023    9:25 AM 01/09/2023    9:29 AM 11/29/2022    9:58 AM  Weight /BMI  Weight 265 lb 270 lb 3.2 oz 266 lb 1.9 oz  Height  (1.651 m)  (1.651 m)  (1.651 m)  BMI 44.1 kg/m2 44.96 kg/m2 44.28 kg/m2    No major change ad xenical

## 2023-01-29 ENCOUNTER — Ambulatory Visit (HOSPITAL_COMMUNITY): Payer: Medicaid Other | Admitting: Licensed Clinical Social Worker

## 2023-02-07 ENCOUNTER — Ambulatory Visit (HOSPITAL_COMMUNITY): Payer: Medicaid Other | Admitting: Licensed Clinical Social Worker

## 2023-02-07 ENCOUNTER — Telehealth: Payer: Self-pay | Admitting: Family Medicine

## 2023-02-07 ENCOUNTER — Other Ambulatory Visit: Payer: Self-pay

## 2023-02-07 MED ORDER — ROSUVASTATIN CALCIUM 10 MG PO TABS: 10.0000 mg | ORAL_TABLET | Freq: Every day | ORAL | 1 refills | Status: DC

## 2023-02-07 NOTE — Telephone Encounter (Signed)
The diet pills that Dr Lodema Hong wrote for patient does it gives her more energy instead of craving eat and loose weight? Patient asked for nurse to give her a call to discuss if this is what the pill suppose to do. Patient said has not worked on her yet.

## 2023-02-07 NOTE — Telephone Encounter (Signed)
Patient is asking will provider write prescription for Orlistat Xenicale (diet pill) ?  [facility]: [facility] [place]

## 2023-02-07 NOTE — Telephone Encounter (Signed)
This was sent in at her office visit already

## 2023-02-07 NOTE — Telephone Encounter (Signed)
Patient called need med refill  rosuvastatin (CRESTOR) 10 MG tablet [[id number]]   Ph[facility]acy: [facility] [place]

## 2023-02-07 NOTE — Telephone Encounter (Signed)
Refills sent to pharmacy. 

## 2023-02-08 NOTE — Telephone Encounter (Signed)
LMTRC-KG 

## 2023-02-13 ENCOUNTER — Ambulatory Visit (HOSPITAL_COMMUNITY): Payer: No Typology Code available for payment source | Admitting: Licensed Clinical Social Worker

## 2023-02-13 ENCOUNTER — Telehealth (HOSPITAL_COMMUNITY): Payer: Self-pay | Admitting: Psychiatry

## 2023-02-13 ENCOUNTER — Encounter (HOSPITAL_COMMUNITY): Payer: Self-pay

## 2023-02-13 NOTE — Progress Notes (Signed)
Therapist contacted patient through text for assessment and she did not show

## 2023-02-13 NOTE — Telephone Encounter (Signed)
Patient no-showed for New Patient Therapy appointment today. One previous no-show for this appointment type as well as 3 cancellations. Patient should be referred to a different practice if therapy is still recommended/needed.

## 2023-02-13 NOTE — Telephone Encounter (Signed)
Agree with plan 

## 2023-02-25 ENCOUNTER — Other Ambulatory Visit: Payer: Self-pay | Admitting: Psychiatry

## 2023-02-25 ENCOUNTER — Other Ambulatory Visit: Payer: Self-pay | Admitting: *Deleted

## 2023-02-25 DIAGNOSIS — F251 Schizoaffective disorder, depressive type: Secondary | ICD-10-CM

## 2023-02-25 MED ORDER — PEG 3350-KCL-NA BICARB-NACL 420 G PO SOLR: 4000.0000 mL | Freq: Once | ORAL | 0 refills | Status: AC

## 2023-03-01 ENCOUNTER — Telehealth: Payer: Self-pay | Admitting: Family Medicine

## 2023-03-01 ENCOUNTER — Other Ambulatory Visit: Payer: Self-pay

## 2023-03-01 DIAGNOSIS — I10 Essential (primary) hypertension: Secondary | ICD-10-CM

## 2023-03-01 MED ORDER — PROPRANOLOL HCL ER 60 MG PO CP24
60.0000 mg | ORAL_CAPSULE | Freq: Every day | ORAL | 3 refills | Status: DC
Start: 2023-03-01 — End: 2024-04-15

## 2023-03-01 NOTE — Telephone Encounter (Signed)
Tried calling patient, no answer and no VM.  

## 2023-03-01 NOTE — Telephone Encounter (Signed)
Patient called said the phentermine 15 MG capsule  is not working for her with her appetite. Asking does she need to take something else. Please return patient call.

## 2023-03-01 NOTE — Telephone Encounter (Signed)
Pt also called and requested a refill on the following medication    propranolol ER (INDERAL LA) 60 MG 24 hr capsule [[id number]]

## 2023-03-05 ENCOUNTER — Telehealth: Payer: Self-pay | Admitting: Family Medicine

## 2023-03-05 ENCOUNTER — Encounter (HOSPITAL_COMMUNITY)
Admission: RE | Admit: 2023-03-05 | Discharge: 2023-03-05 | Disposition: A | Discharge status: Home / Self Care | Payer: Medicaid Other | Source: Ambulatory Visit | Attending: Internal Medicine | Admitting: Internal Medicine

## 2023-03-05 ENCOUNTER — Encounter (HOSPITAL_COMMUNITY): Payer: Self-pay

## 2023-03-05 HISTORY — DX: Essential (primary) hypertension: I10

## 2023-03-05 NOTE — Telephone Encounter (Signed)
Spoke with patient.

## 2023-03-05 NOTE — Telephone Encounter (Unsigned)
Pt called back about previous encounter. [phone]  [phone]

## 2023-03-08 ENCOUNTER — Ambulatory Visit (HOSPITAL_COMMUNITY): Payer: Medicaid Other | Admitting: Certified Registered Nurse Anesthetist

## 2023-03-08 ENCOUNTER — Ambulatory Visit (HOSPITAL_BASED_OUTPATIENT_CLINIC_OR_DEPARTMENT_OTHER): Payer: Medicaid Other | Admitting: Certified Registered Nurse Anesthetist

## 2023-03-08 ENCOUNTER — Ambulatory Visit (HOSPITAL_COMMUNITY)
Admission: RE | Admit: 2023-03-08 | Discharge: 2023-03-08 | Disposition: A | Discharge status: Home / Self Care | Payer: Medicaid Other | Source: Ambulatory Visit | Attending: Internal Medicine | Admitting: Internal Medicine

## 2023-03-08 ENCOUNTER — Encounter (HOSPITAL_COMMUNITY): Payer: Self-pay

## 2023-03-08 ENCOUNTER — Encounter (HOSPITAL_COMMUNITY)
Admission: RE | Disposition: A | Discharge status: Home / Self Care | Payer: Self-pay | Source: Ambulatory Visit | Attending: Internal Medicine

## 2023-03-08 DIAGNOSIS — Z87891 Personal history of nicotine dependence: Secondary | ICD-10-CM | POA: Diagnosis not present

## 2023-03-08 DIAGNOSIS — Z86010 Personal history of colonic polyps: Secondary | ICD-10-CM | POA: Diagnosis not present

## 2023-03-08 DIAGNOSIS — K2950 Unspecified chronic gastritis without bleeding: Secondary | ICD-10-CM

## 2023-03-08 DIAGNOSIS — I10 Essential (primary) hypertension: Secondary | ICD-10-CM | POA: Insufficient documentation

## 2023-03-08 DIAGNOSIS — F419 Anxiety disorder, unspecified: Secondary | ICD-10-CM | POA: Diagnosis not present

## 2023-03-08 DIAGNOSIS — Z6841 Body Mass Index (BMI) 40.0 and over, adult: Secondary | ICD-10-CM | POA: Diagnosis not present

## 2023-03-08 DIAGNOSIS — F32A Depression, unspecified: Secondary | ICD-10-CM | POA: Diagnosis not present

## 2023-03-08 DIAGNOSIS — K449 Diaphragmatic hernia without obstruction or gangrene: Secondary | ICD-10-CM | POA: Diagnosis not present

## 2023-03-08 DIAGNOSIS — K648 Other hemorrhoids: Secondary | ICD-10-CM | POA: Diagnosis not present

## 2023-03-08 DIAGNOSIS — Z1211 Encounter for screening for malignant neoplasm of colon: Secondary | ICD-10-CM

## 2023-03-08 DIAGNOSIS — K2970 Gastritis, unspecified, without bleeding: Secondary | ICD-10-CM

## 2023-03-08 DIAGNOSIS — K219 Gastro-esophageal reflux disease without esophagitis: Secondary | ICD-10-CM | POA: Diagnosis not present

## 2023-03-08 HISTORY — PX: BIOPSY: SHX5522

## 2023-03-08 HISTORY — PX: COLONOSCOPY WITH PROPOFOL: SHX5780

## 2023-03-08 HISTORY — PX: ESOPHAGOGASTRODUODENOSCOPY (EGD) WITH PROPOFOL: SHX5813

## 2023-03-08 SURGERY — COLONOSCOPY WITH PROPOFOL
Anesthesia: General

## 2023-03-08 MED ORDER — LACTATED RINGERS IV SOLN: INTRAVENOUS | Status: DC

## 2023-03-08 MED ORDER — PROPOFOL 10 MG/ML IV BOLUS
INTRAVENOUS | Status: DC | PRN
  Administered 2023-03-08: 80 mg via INTRAVENOUS

## 2023-03-08 MED ORDER — PROPOFOL 500 MG/50ML IV EMUL
INTRAVENOUS | Status: DC | PRN
  Administered 2023-03-08: 150 ug/kg/min via INTRAVENOUS

## 2023-03-08 MED ORDER — STERILE WATER FOR IRRIGATION IR SOLN
Status: DC | PRN
  Administered 2023-03-08: 60 mL

## 2023-03-08 MED ORDER — LIDOCAINE HCL (CARDIAC) PF 100 MG/5ML IV SOSY
PREFILLED_SYRINGE | INTRAVENOUS | Status: DC | PRN
  Administered 2023-03-08: 50 mg via INTRAVENOUS

## 2023-03-08 NOTE — H&P (Signed)
Primary Care Physician:  Kerri Perches, MD Primary Gastroenterologist:  Dr. Marletta Lor  Pre-Procedure History & Physical: HPI:  Karen Cox is a 62 y.o. female is here for an EGD for chronic GERD and a colonoscopy to be performed for colon cancer screening purposes.  Past Medical History:  Diagnosis Date   Allergy    Anxiety    Cervical high risk HPV (human papillomavirus) test positive 05/17/2014   Depression    Eczema    GERD (gastroesophageal reflux disease)    Hot flashes 09/01/2013   Hyperlipidemia    Hypertension    Mild mental slowing    Peri-menopause 08/05/2013   PMB (postmenopausal bleeding) 11/16/2013   Urticaria    Vaginal itching 08/05/2013   Yeast infection 08/05/2013    Past Surgical History:  Procedure Laterality Date   CERVICAL POLYPECTOMY N/A 01/06/2014   Procedure: CERVICAL POLYPECTOMY;  Surgeon: Lazaro Arms, MD;  Location: [facility];  Service: Gynecology;  Laterality: N/A;   COLONOSCOPY  09/19/2012   3 mm sessile polyp removed from the colon (simple adenoma).  Advised 10-year follow-up colonoscopy   DILITATION & CURRETTAGE/HYSTROSCOPY WITH THERMACHOICE ABLATION N/A 01/06/2014   Procedure: DILATATION & CURETTAGE/HYSTEROSCOPY WITH THERMACHOICE ABLATION;  Surgeon: Lazaro Arms, MD;  Location: [facility];  Service: Gynecology;  Laterality: N/A;  total therapy time:  9 minutes 12 seconds ,     temperature:87 degrees    Prior to Admission medications   Medication Sig Start Date End Date Taking? Authorizing Provider  medroxyPROGESTERone (PROVERA) 5 MG tablet Take 5 mg by mouth daily.   Yes [provider]  pantoprazole (PROTONIX) 40 MG tablet TAKE 1 TABLET BY MOUTH TWICE A DAY BEFORE A MEAL. 01/09/23  Yes Mahon, Courtney L, NP  PARoxetine (PAXIL) 30 MG tablet Take 1 tablet (30 mg total) by mouth daily. 12/07/22  Yes Jomarie Longs, MD  propranolol ER (INDERAL LA) 60 MG 24 hr capsule Take 1 capsule (60 mg total) by mouth daily. 03/01/23  Yes Kerri Perches, MD  QUEtiapine (SEROQUEL XR) 50 MG TB24 24 hr tablet TAKE 2 TABLETS BY MOUTH AT BEDTIME. 02/26/23  Yes Eappen, Saramma, MD  rosuvastatin (CRESTOR) 10 MG tablet Take 1 tablet (10 mg total) by mouth daily. 02/07/23  Yes Kerri Perches, MD  buPROPion (WELLBUTRIN XL) 300 MG 24 hr tablet Take 1 tablet (300 mg total) by mouth in the morning. 12/07/22   Jomarie Longs, MD  orlistat (XENICAL) 120 MG capsule Take 1 capsule (120 mg total) by mouth 3 (three) times daily with meals. Patient not taking: Reported on 02/26/2023 01/25/23   Kerri Perches, MD  phentermine 15 MG capsule Take 1 capsule (15 mg total) by mouth every morning. Patient not taking: Reported on 02/26/2023 01/27/23   Kerri Perches, MD    Allergies as of 01/14/2023 - Review Complete 01/09/2023  Allergen Reaction Noted   Benadryl  [diphenhydramine hcl (sleep)]     Benadryl [diphenhydramine hcl] Other (See Comments) 12/31/2010   Dextromethorphan hbr Hives 05/29/2016   Robitussin (alcohol free) [guaifenesin]  05/17/2014   Robitussin dm max day-night Other (See Comments) 06/26/2022   Aspirin Rash and Hives 05/08/2011   Diphenhydramine hcl Hives and Rash 03/01/2016   Latex Rash 09/04/2011    Family History  Problem Relation Age of Onset   Diabetes Father    Heart disease Father    Hypertension Father    Hyperlipidemia Father    Arthritis Maternal Grandmother    Anxiety disorder  Maternal Grandmother    Heart disease Maternal Grandfather        pacemaker   Anxiety disorder Daughter    Diabetes Maternal Uncle    Anemia Maternal Uncle    Arthritis Maternal Uncle    Anxiety disorder Maternal Uncle    Colon cancer Cousin    Stomach cancer Cousin    Cancer Neg Hx    Allergic rhinitis Neg Hx    Angioedema Neg Hx    Asthma Neg Hx    Eczema Neg Hx    Immunodeficiency Neg Hx    Urticaria Neg Hx    Atopy Neg Hx     Social History   Socioeconomic History   Marital status: Single    Spouse name: Not on file    Number of children: 1   Years of education: Not on file   Highest education level: High school graduate  Occupational History   Not on file  Tobacco Use   Smoking status: Former    Packs/day: 0.25    Years: 3.00    Additional pack years: 0.00    Total pack years: 0.75    Types: Cigarettes    Quit date: 09/03/1981    Years since quitting: 41.5   Smokeless tobacco: Never  Vaping Use   Vaping Use: Never used  Substance and Sexual Activity   Alcohol use: Yes    Comment: occ   Drug use: No   Sexual activity: Not Currently    Birth control/protection: Post-menopausal  Other Topics Concern   Not on file  Social History Narrative   Not on file   Social Determinants of Health   Financial Resource Strain: Not on file  Food Insecurity: Not on file  Transportation Needs: Not on file  Physical Activity: Not on file  Stress: Not on file  Social Connections: Not on file  Intimate Partner Violence: Not on file    Review of Systems: See HPI, otherwise negative ROS  Physical Exam: Vital signs in last 24 hours: Temp:  [97.6 F (36.4 C)] 97.6 F (36.4 C) (05/31 0649) Pulse Rate:  [108] 108 (05/31 0649) Resp:  [19] 19 (05/31 0649) BP: (140)/(93) 140/93 (05/31 0649) SpO2:  [99 %] 99 % (05/31 0649) Weight:  [121.1 kg] 121.1 kg (05/31 0649)   General:   Alert,  Well-developed, well-nourished, pleasant and cooperative in NAD Head:  Normocephalic and atraumatic. Eyes:  Sclera clear, no icterus.   Conjunctiva pink. Ears:  Normal auditory acuity. Nose:  No deformity, discharge,  or lesions. Msk:  Symmetrical without gross deformities. Normal posture. Extremities:  Without clubbing or edema. Neurologic:  Alert and  oriented x4;  grossly normal neurologically. Skin:  Intact without significant lesions or rashes. Psych:  Alert and cooperative. Normal mood and affect.  Impression/Plan: Karen Cox is here for an EGD for chronic GERD and a colonoscopy to be performed for  colon cancer screening purposes.  The risks of the procedure including infection, bleed, or perforation as well as benefits, limitations, alternatives and imponderables have been reviewed with the patient. Questions have been answered. All parties agreeable.

## 2023-03-08 NOTE — Op Note (Signed)
[facility] Patient Name: Karen Cox Procedure Date: 03/08/2023 7:09 AM MRN: [id number] Date of Birth: [birth year] Attending MD: Hennie Duos. Marletta Lor , [place], [id number] CSN: [id number] Age: 62 Admit Type: Outpatient Procedure:                Upper GI endoscopy Indications:              Gastro-esophageal reflux disease Providers:                Hennie Duos. Marletta Lor, DO, Angelica Ran, Crystal Page Referring MD:              Medicines:                See the Anesthesia note for documentation of the                            administered medications Complications:            No immediate complications. Estimated Blood Loss:     Estimated blood loss was minimal. Procedure:                Pre-Anesthesia Assessment:                           - The anesthesia plan was to use monitored                            anesthesia care (MAC).                           After obtaining informed consent, the endoscope was                            passed under direct vision. Throughout the                            procedure, the patient's blood pressure, pulse, and                            oxygen saturations were monitored continuously. The                            [id number]-H190 ([id number]) scope was introduced through the                            mouth, and advanced to the second part of duodenum.                            The upper GI endoscopy was accomplished without                            difficulty. The patient tolerated the procedure                            well. Scope In: 7:36:35 AM Scope Out: 7:39:44 AM Total Procedure Duration: 0 hours 3 minutes 9 seconds  Findings:      A 5 cm hiatal hernia was present.  The Z-line was regular.      Patchy mild inflammation characterized by erythema was found in the       gastric body and in the gastric antrum. Biopsies were taken with a cold       forceps for Helicobacter pylori testing.      The duodenal bulb, first portion of the  duodenum and second portion of       the duodenum were normal. Impression:               - 5 cm hiatal hernia.                           - Z-line regular.                           - Gastritis. Biopsied.                           - Normal duodenal bulb, first portion of the                            duodenum and second portion of the duodenum. Moderate Sedation:      Per Anesthesia Care Recommendation:           - Patient has a contact number available for                            emergencies. The signs and symptoms of potential                            delayed complications were discussed with the                            patient. Return to normal activities tomorrow.                            Written discharge instructions were provided to the                            patient.                           - Resume previous diet.                           - Continue present medications.                           - Await pathology results.                           - Use Protonix (pantoprazole) 40 mg PO BID.                           - Return to GI clinic in 3 months. Procedure Code(s):        --- Professional ---  16109, Esophagogastroduodenoscopy, flexible,                            transoral; with biopsy, single or multiple Diagnosis Code(s):        --- Professional ---                           K44.9, Diaphragmatic hernia without obstruction or                            gangrene                           K29.70, Gastritis, unspecified, without bleeding                           K21.9, Gastro-esophageal reflux disease without                            esophagitis CPT copyright 2022 American Medical Association. All rights reserved. The codes documented in this report are preliminary and upon coder review may  be revised to meet current compliance requirements. Hennie Duos. Marletta Lor, DO Hennie Duos. Marletta Lor, DO 03/08/2023 7:42:05 AM This report has been  signed electronically. Number of Addenda: 0

## 2023-03-08 NOTE — Telephone Encounter (Signed)
Spoke with patient.

## 2023-03-08 NOTE — Op Note (Signed)
[facility] Patient Name: Karen Cox Procedure Date: 03/08/2023 7:09 AM MRN: [id number] Date of Birth: 1961-05-10 Attending MD: Hennie Duos. Marletta Lor , [place], [id number] CSN: [id number] Age: 62 Admit Type: Outpatient Procedure:                Colonoscopy Indications:              Screening for colorectal malignant neoplasm Providers:                Hennie Duos. Marletta Lor, DO, Angelica Ran, Crystal Page Referring MD:              Medicines:                See the Anesthesia note for documentation of the                            administered medications Complications:            No immediate complications. Estimated Blood Loss:     Estimated blood loss: none. Procedure:                Pre-Anesthesia Assessment:                           - The anesthesia plan was to use monitored                            anesthesia care (MAC).                           After obtaining informed consent, the colonoscope                            was passed under direct vision. Throughout the                            procedure, the patient's blood pressure, pulse, and                            oxygen saturations were monitored continuously. The                            [id number]-[id number] ([id number]) scope was introduced through                            the anus and advanced to the the cecum, identified                            by appendiceal orifice and ileocecal valve. The                            colonoscopy was performed without difficulty. The                            patient tolerated the procedure well. The quality                            of  the bowel preparation was evaluated using the                            BBPS [facility] Bowel Preparation Scale) with scores                            of: Right Colon = 3, Transverse Colon = 3 and Left                            Colon = 3 (entire mucosa seen well with no residual                            staining, small fragments of stool or opaque                             liquid). The total BBPS score equals 9. Scope In: 7:44:21 AM Scope Out: 7:57:40 AM Scope Withdrawal Time: 0 hours 10 minutes 10 seconds  Total Procedure Duration: 0 hours 13 minutes 19 seconds  Findings:      Non-bleeding internal hemorrhoids were found during endoscopy.      The exam was otherwise without abnormality. Impression:               - Non-bleeding internal hemorrhoids.                           - The examination was otherwise normal.                           - No specimens collected. Moderate Sedation:      Per Anesthesia Care Recommendation:           - Patient has a contact number available for                            emergencies. The signs and symptoms of potential                            delayed complications were discussed with the                            patient. Return to normal activities tomorrow.                            Written discharge instructions were provided to the                            patient.                           - Resume previous diet.                           - Continue present medications.                           - Repeat colonoscopy in 10  years for screening                            purposes.                           - Return to GI clinic in 3 months. Procedure Code(s):        --- Professional ---                           Z6109, Colorectal cancer screening; colonoscopy on                            individual not meeting criteria for high risk Diagnosis Code(s):        --- Professional ---                           Z12.11, Encounter for screening for malignant                            neoplasm of colon                           K64.8, Other hemorrhoids CPT copyright 2022 American Medical Association. All rights reserved. The codes documented in this report are preliminary and upon coder review may  be revised to meet current compliance requirements. Hennie Duos. Marletta Lor, DO Hennie Duos. Marletta Lor,  DO 03/08/2023 8:00:21 AM This report has been signed electronically. Number of Addenda: 0

## 2023-03-08 NOTE — Anesthesia Preprocedure Evaluation (Signed)
Anesthesia Evaluation  Patient identified by MRN, date of birth, ID band Patient awake    Reviewed: Allergy & Precautions, H&P , NPO status , Patient's Chart, lab work & pertinent test results, reviewed documented beta blocker date and time   Airway Mallampati: II  TM Distance: >3 FB Neck ROM: full    Dental no notable dental hx.    Pulmonary neg pulmonary ROS, former smoker   Pulmonary exam normal breath sounds clear to auscultation       Cardiovascular Exercise Tolerance: Good hypertension, negative cardio ROS  Rhythm:regular Rate:Normal     Neuro/Psych  PSYCHIATRIC DISORDERS Anxiety Depression  Schizophrenia  negative neurological ROS  negative psych ROS   GI/Hepatic negative GI ROS, Neg liver ROS,GERD  ,,  Endo/Other    Morbid obesity  Renal/GU negative Renal ROS  negative genitourinary   Musculoskeletal   Abdominal   Peds  Hematology negative hematology ROS (+)   Anesthesia Other Findings   Reproductive/Obstetrics negative OB ROS                             Anesthesia Physical Anesthesia Plan  ASA: 3  Anesthesia Plan: General   Post-op Pain Management:    Induction:   PONV Risk Score and Plan: Propofol infusion  Airway Management Planned:   Additional Equipment:   Intra-op Plan:   Post-operative Plan:   Informed Consent: I have reviewed the patients History and Physical, chart, labs and discussed the procedure including the risks, benefits and alternatives for the proposed anesthesia with the patient or authorized representative who has indicated his/her understanding and acceptance.     Dental Advisory Given  Plan Discussed with: CRNA  Anesthesia Plan Comments:        Anesthesia Quick Evaluation

## 2023-03-08 NOTE — Transfer of Care (Signed)
Immediate Anesthesia Transfer of Care Note  Patient: Karen Cox  Procedure(s) Performed: COLONOSCOPY WITH PROPOFOL ESOPHAGOGASTRODUODENOSCOPY (EGD) WITH PROPOFOL BIOPSY  Patient Location: Short Stay  Anesthesia Type:General  Level of Consciousness: awake, alert , and oriented  Airway & Oxygen Therapy: Patient Spontanous Breathing  Post-op Assessment: Report given to RN and Post -op Vital signs reviewed and stable  Post vital signs: Reviewed and stable  Last Vitals:  Vitals Value Taken Time  BP 99/56 03/08/23 0802  Temp 36.4 C 03/08/23 0802  Pulse 90 03/08/23 0802  Resp 21 03/08/23 0802  SpO2 93 % 03/08/23 0802    Last Pain:  Vitals:   03/08/23 0802  TempSrc: Oral  PainSc: 0-No pain         Complications: No notable events documented.

## 2023-03-08 NOTE — Discharge Instructions (Addendum)
EGD Discharge instructions Please read the instructions outlined below and refer to this sheet in the next few weeks. These discharge instructions provide you with general information on caring for yourself after you leave the hospital. Your doctor may also give you specific instructions. While your treatment has been planned according to the most current medical practices available, unavoidable complications occasionally occur. If you have any problems or questions after discharge, please call your doctor. ACTIVITY You may resume your regular activity but move at a slower pace for the next 24 hours.  Take frequent rest periods for the next 24 hours.  Walking will help expel (get rid of) the air and reduce the bloated feeling in your abdomen.  No driving for 24 hours (because of the anesthesia (medicine) used during the test).  You may shower.  Do not sign any important legal documents or operate any machinery for 24 hours (because of the anesthesia used during the test).  NUTRITION Drink plenty of fluids.  You may resume your normal diet.  Begin with a light meal and progress to your normal diet.  Avoid alcoholic beverages for 24 hours or as instructed by your caregiver.  MEDICATIONS You may resume your normal medications unless your caregiver tells you otherwise.  WHAT YOU CAN EXPECT TODAY You may experience abdominal discomfort such as a feeling of fullness or "gas" pains.  FOLLOW-UP Your doctor will discuss the results of your test with you.  SEEK IMMEDIATE MEDICAL ATTENTION IF ANY OF THE FOLLOWING OCCUR: Excessive nausea (feeling sick to your stomach) and/or vomiting.  Severe abdominal pain and distention (swelling).  Trouble swallowing.  Temperature over 101 F (37.8 C).  Rectal bleeding or vomiting of blood.    Colonoscopy Discharge Instructions  Read the instructions outlined below and refer to this sheet in the next few weeks. These discharge instructions provide you with  general information on caring for yourself after you leave the hospital. Your doctor may also give you specific instructions. While your treatment has been planned according to the most current medical practices available, unavoidable complications occasionally occur.   ACTIVITY You may resume your regular activity, but move at a slower pace for the next 24 hours.  Take frequent rest periods for the next 24 hours.  Walking will help get rid of the air and reduce the bloated feeling in your belly (abdomen).  No driving for 24 hours (because of the medicine (anesthesia) used during the test).   Do not sign any important legal documents or operate any machinery for 24 hours (because of the anesthesia used during the test).  NUTRITION Drink plenty of fluids.  You may resume your normal diet as instructed by your doctor.  Begin with a light meal and progress to your normal diet. Heavy or fried foods are harder to digest and may make you feel sick to your stomach (nauseated).  Avoid alcoholic beverages for 24 hours or as instructed.  MEDICATIONS You may resume your normal medications unless your doctor tells you otherwise.  WHAT YOU CAN EXPECT TODAY Some feelings of bloating in the abdomen.  Passage of more gas than usual.  Spotting of blood in your stool or on the toilet paper.  IF YOU HAD POLYPS REMOVED DURING THE COLONOSCOPY: No aspirin products for 7 days or as instructed.  No alcohol for 7 days or as instructed.  Eat a soft diet for the next 24 hours.  FINDING OUT THE RESULTS OF YOUR TEST Not all test results are available  during your visit. If your test results are not back during the visit, make an appointment with your caregiver to find out the results. Do not assume everything is normal if you have not heard from your caregiver or the medical facility. It is important for you to follow up on all of your test results.  SEEK IMMEDIATE MEDICAL ATTENTION IF: You have more than a spotting of  blood in your stool.  Your belly is swollen (abdominal distention).  You are nauseated or vomiting.  You have a temperature over 101.  You have abdominal pain or discomfort that is severe or gets worse throughout the day.   Your upper endoscopy revealed a medium size hiatal hernia.  Esophagus normal.  You did have mild amount of inflammation in your stomach.  I took biopsies to rule infection with a bacteria called H. pylori.  Await pathology results, office will contact you.  Small bowel appeared normal.  Continue on pantoprazole twice daily.  Your colonoscopy was relatively unremarkable.  I did not find any polyps or evidence of colon cancer.  I recommend repeating colonoscopy in 10 years for colon cancer screening purposes.    Follow-up with GI in 2-3 months   I hope you have a great rest of your week!  Hennie Duos. Marletta Lor, D.O. Gastroenterology and Hepatology [facility]

## 2023-03-11 LAB — SURGICAL PATHOLOGY

## 2023-03-13 ENCOUNTER — Encounter (HOSPITAL_COMMUNITY): Payer: Self-pay | Admitting: Internal Medicine

## 2023-03-13 ENCOUNTER — Ambulatory Visit: Payer: Medicaid Other | Admitting: Family Medicine

## 2023-03-13 NOTE — Anesthesia Postprocedure Evaluation (Signed)
Anesthesia Post Note  Patient: Karen Cox  Procedure(s) Performed: COLONOSCOPY WITH PROPOFOL ESOPHAGOGASTRODUODENOSCOPY (EGD) WITH PROPOFOL BIOPSY  Patient location during evaluation: Phase II Anesthesia Type: General Level of consciousness: awake Pain management: pain level controlled Vital Signs Assessment: post-procedure vital signs reviewed and stable Respiratory status: spontaneous breathing and respiratory function stable Cardiovascular status: blood pressure returned to baseline and stable Postop Assessment: no headache and no apparent nausea or vomiting Anesthetic complications: no Comments: Late entry   No notable events documented.   Last Vitals:  Vitals:   03/08/23 0802 03/08/23 0805  BP: (!) 99/56 104/73  Pulse: 90   Resp: (!) 21   Temp: 36.4 C   SpO2: 93%     Last Pain:  Vitals:   03/11/23 1255  TempSrc:   PainSc: 0-No pain                 Windell Norfolk

## 2023-03-18 ENCOUNTER — Telehealth: Payer: Self-pay

## 2023-03-18 NOTE — Telephone Encounter (Signed)
Returned the pt's call as asked regarding her wanting her test results. Pt's phone just continuously rang. According to the notes the pt seen her results last week

## 2023-03-20 ENCOUNTER — Encounter: Payer: Self-pay | Admitting: Psychiatry

## 2023-03-20 ENCOUNTER — Ambulatory Visit (INDEPENDENT_AMBULATORY_CARE_PROVIDER_SITE_OTHER): Payer: No Typology Code available for payment source | Admitting: Psychiatry

## 2023-03-20 VITALS — BP 138/81 | HR 101 | Temp 97.9°F | Ht 65.0 in | Wt 270.6 lb

## 2023-03-20 DIAGNOSIS — F411 Generalized anxiety disorder: Secondary | ICD-10-CM

## 2023-03-20 DIAGNOSIS — F251 Schizoaffective disorder, depressive type: Secondary | ICD-10-CM | POA: Diagnosis not present

## 2023-03-20 DIAGNOSIS — R4183 Borderline intellectual functioning: Secondary | ICD-10-CM

## 2023-03-20 DIAGNOSIS — Z79899 Other long term (current) drug therapy: Secondary | ICD-10-CM | POA: Diagnosis not present

## 2023-03-20 MED ORDER — PAROXETINE HCL 30 MG PO TABS: 30.0000 mg | ORAL_TABLET | Freq: Every day | ORAL | 0 refills | Status: DC

## 2023-03-20 NOTE — Progress Notes (Signed)
BH MD OP Progress Note  03/20/2023 11:30 AM Karen Cox  MRN:  [id number]  Chief Complaint:  Chief Complaint  Patient presents with   Follow-up   Medication Refill   Anxiety   Depression   HPI: Karen Cox is a African-American female is a  62 year old African-American female, lives in [place], has a history of GAD, schizoaffective disorder, borderline intellectual functioning, morbid obesity, seasonal allergies, insomnia was evaluated in office today.  Patient's last visit was on 06/26/2022.  Patient today appeared to be pleasant, alert, cooperative.  She was able to answer questions appropriately.  Patient reports overall mood symptoms are stable.  Denies any hallucinations at this time.  Patient denies any paranoia did not appear to be preoccupied with any delusions.  Patient denies any suicidality, homicidality or perceptual disturbances.  Patient is compliant on medications like Paxil, Seroquel, Wellbutrin.  Denies side effects.  Patient does report GI problems, currently following up with her providers.  Has upcoming appointment for further testing.  She had a colonoscopy completed as well as an endoscopy completed recently.  Patient reports she is interested in losing weight and is planning to talk to her primary care provider regarding this.  Patient appeared to be alert, oriented to person place time and situation.  3 word memory immediate 3 out of 3, after 5 minutes 3 out of 3.  Patient was able to do digits forward and backward.  Concentration seems to be good.  Visit Diagnosis:    ICD-10-CM   1. Schizoaffective disorder, depressive type (HCC)  F25.1 PARoxetine (PAXIL) 30 MG tablet    2. GAD (generalized anxiety disorder)  F41.1 PARoxetine (PAXIL) 30 MG tablet    3. Borderline intellectual functioning  R41.83     4. High risk medication use  Z79.899 TSH    Prolactin      Past Psychiatric History: Reviewed past psychiatric history from progress  note on 12/09/2019.  Past trials of BuSpar, hydroxyzine, melatonin, Paxil, Wellbutrin.  Past Medical History:  Past Medical History:  Diagnosis Date   Allergy    Anxiety    Cervical high risk HPV (human papillomavirus) test positive 05/17/2014   Depression    Eczema    GERD (gastroesophageal reflux disease)    Hot flashes 09/01/2013   Hyperlipidemia    Hypertension    Mild mental slowing    Peri-menopause 08/05/2013   PMB (postmenopausal bleeding) 11/16/2013   Urticaria    Vaginal itching 08/05/2013   Yeast infection 08/05/2013    Past Surgical History:  Procedure Laterality Date   BIOPSY  03/08/2023   Procedure: BIOPSY;  Surgeon: Lanelle Bal, DO;  [facility];  Service: Endoscopy;;   CERVICAL POLYPECTOMY N/A 01/06/2014   Procedure: CERVICAL POLYPECTOMY;  Surgeon: Lazaro Arms, MD;  Location: [facility];  Service: Gynecology;  Laterality: N/A;   COLONOSCOPY  09/19/2012   3 mm sessile polyp removed from the colon (simple adenoma).  Advised 10-year follow-up colonoscopy   COLONOSCOPY WITH PROPOFOL N/A 03/08/2023   Procedure: COLONOSCOPY WITH PROPOFOL;  Surgeon: Lanelle Bal, DO;  [facility];  Service: Endoscopy;  Laterality: N/A;  7:30 am, ASA 3   DILITATION & CURRETTAGE/HYSTROSCOPY WITH THERMACHOICE ABLATION N/A 01/06/2014   Procedure: DILATATION & CURETTAGE/HYSTEROSCOPY WITH THERMACHOICE ABLATION;  Surgeon: Lazaro Arms, MD;  Location: [facility];  Service: Gynecology;  Laterality: N/A;  total therapy time:  9 minutes 12 seconds ,     temperature:87 degrees   ESOPHAGOGASTRODUODENOSCOPY (  EGD) WITH PROPOFOL N/A 03/08/2023   Procedure: ESOPHAGOGASTRODUODENOSCOPY (EGD) WITH PROPOFOL;  Surgeon: Lanelle Bal, DO;  [facility];  Service: Endoscopy;  Laterality: N/A;    Family Psychiatric History: Reviewed family psychiatric history from progress note on 12/09/2019.  Family History:  Family History  Problem Relation Age of Onset   Diabetes Father     Heart disease Father    Hypertension Father    Hyperlipidemia Father    Arthritis Maternal Grandmother    Anxiety disorder Maternal Grandmother    Heart disease Maternal Grandfather        pacemaker   Anxiety disorder Daughter    Diabetes Maternal Uncle    Anemia Maternal Uncle    Arthritis Maternal Uncle    Anxiety disorder Maternal Uncle    Colon cancer Cousin    Stomach cancer Cousin    Cancer Neg Hx    Allergic rhinitis Neg Hx    Angioedema Neg Hx    Asthma Neg Hx    Eczema Neg Hx    Immunodeficiency Neg Hx    Urticaria Neg Hx    Atopy Neg Hx     Social History: Reviewed social history from progress note on 12/09/2019. Social History   Socioeconomic History   Marital status: Single    Spouse name: Not on file   Number of children: 1   Years of education: Not on file   Highest education level: High school graduate  Occupational History   Not on file  Tobacco Use   Smoking status: Former    Packs/day: 0.25    Years: 3.00    Additional pack years: 0.00    Total pack years: 0.75    Types: Cigarettes    Quit date: 09/03/1981    Years since quitting: 41.5   Smokeless tobacco: Never  Vaping Use   Vaping Use: Never used  Substance and Sexual Activity   Alcohol use: Yes    Comment: occ   Drug use: No   Sexual activity: Not Currently    Birth control/protection: Post-menopausal  Other Topics Concern   Not on file  Social History Narrative   Not on file   Social Determinants of Health   Financial Resource Strain: Not on file  Food Insecurity: Not on file  Transportation Needs: Not on file  Physical Activity: Not on file  Stress: Not on file  Social Connections: Not on file    Allergies:  Allergies  Allergen Reactions   Benadryl  [Diphenhydramine Hcl (Sleep)]    Benadryl [Diphenhydramine Hcl] Other (See Comments)    Makes congestion worst    Dextromethorphan Hbr Hives   Robitussin (Alcohol Free) [Guaifenesin]     Makes cough worse   Robitussin Dm  Max Day-Night Other (See Comments)   Aspirin Rash and Hives    rash   Diphenhydramine Hcl Hives and Rash    Makes congestion worst    Latex Rash    Metabolic Disorder Labs: Lab Results  Component Value Date   HGBA1C 5.6 08/16/2022   MPG 114 07/25/2021   MPG 94 08/18/2013   Lab Results  Component Value Date   PROLACTIN 18.6 07/25/2021   Lab Results  Component Value Date   CHOL 125 01/25/2023   TRIG 126 01/25/2023   HDL 41 01/25/2023   CHOLHDL 3.0 01/25/2023   VLDL 20 08/17/2016   LDLCALC 62 01/25/2023   LDLCALC 139 (H) 08/16/2022   Lab Results  Component Value Date   TSH 2.346  07/25/2021   TSH 1.83 03/18/2020    Therapeutic Level Labs: No results found for: "LITHIUM" No results found for: "VALPROATE" No results found for: "CBMZ"  Current Medications: Current Outpatient Medications  Medication Sig Dispense Refill   buPROPion (WELLBUTRIN XL) 300 MG 24 hr tablet Take 1 tablet (300 mg total) by mouth in the morning. 90 tablet 1   medroxyPROGESTERone (PROVERA) 5 MG tablet Take 5 mg by mouth daily.     pantoprazole (PROTONIX) 40 MG tablet TAKE 1 TABLET BY MOUTH TWICE A DAY BEFORE A MEAL. 180 tablet 3   propranolol ER (INDERAL LA) 60 MG 24 hr capsule Take 1 capsule (60 mg total) by mouth daily. 90 capsule 3   QUEtiapine (SEROQUEL XR) 50 MG TB24 24 hr tablet TAKE 2 TABLETS BY MOUTH AT BEDTIME. 180 tablet 0   rosuvastatin (CRESTOR) 10 MG tablet Take 1 tablet (10 mg total) by mouth daily. 90 tablet 1   PARoxetine (PAXIL) 30 MG tablet Take 1 tablet (30 mg total) by mouth daily. 90 tablet 0   Current Facility-Administered Medications  Medication Dose Route Frequency Provider Last Rate Last Admin   omalizumab Geoffry Paradise) injection 300 mg  300 mg Subcutaneous Q28 days Alfonse Spruce, MD   300 mg at 12/31/17 1035     Musculoskeletal: Strength & Muscle Tone: within normal limits Gait & Station: normal Patient leans: N/A  Psychiatric Specialty Exam: Review of Systems   Psychiatric/Behavioral: Negative.      Blood pressure 138/81, pulse (!) 101, temperature 97.9 F (36.6 C), temperature source Skin, height 5\' 5"  (1.651 m), weight 270 lb 9.6 oz (122.7 kg), last menstrual period 06/27/2013.Body mass index is 45.03 kg/m.  General Appearance: Casual  Eye Contact:  Fair  Speech:  Clear and Coherent  Volume:  Normal  Mood:  Euthymic  Affect:  Congruent  Thought Process:  Goal Directed and Descriptions of Associations: Intact  Orientation:  Full (Time, Place, and Person)  Thought Content: Logical   Suicidal Thoughts:  No  Homicidal Thoughts:  No  Memory:  Immediate;   Fair Recent;   Fair Remote;   Poor  Judgement:  Fair  Insight:  Fair  Psychomotor Activity:  Normal  Concentration:  Concentration: Fair and Attention Span: Fair  Recall:  [employer] of Knowledge: Fair  Language: Fair  Akathisia:  No  Handed:  Right  AIMS (if indicated): done  Assets:  Desire for Improvement Housing Social Support Transportation  ADL's:  Intact  Cognition: baseline  Sleep:  Fair   Screenings: Geneticist, molecular Office Visit from 03/20/2023 in [facility] Office Visit from 06/26/2022 in [facility] Video Visit from 02/27/2022 in [facility]  AIMS Total Score 0 0 0      GAD-7    Flowsheet Row Office Visit from 03/20/2023 in [facility] Office Visit from 01/25/2023 in [facility] Office Visit from 11/29/2022 in [facility] Office Visit from 11/15/2022 in [facility] Office Visit from 10/11/2022 in [facility]  Total GAD-7 Score 0 11 15 14 11       PHQ2-9    Flowsheet Row Office Visit from 03/20/2023 in [facility] Office Visit from 01/25/2023 in [facility]  Primary Care Office Visit from 11/29/2022 in [facility] Office Visit from 11/15/2022  in [facility] Office Visit from 10/11/2022 in [facility]  PHQ-2 Total Score 2 5 4 5 5   PHQ-9 Total Score 2 9 14 7 7       Flowsheet Row Office Visit from 03/20/2023 in [facility] Admission (Discharged) from 03/08/2023 in [facility] Pre-Admission Testing 60 from 03/05/2023 in [facility]  C-SSRS RISK CATEGORY No Risk No Risk No Risk        Assessment and Plan: MCKENZIE DUHR is a 62 year old African-American female who lives in [place], has a history of GAD, schizoaffective disorder, borderline intellectual functioning, morbid obesity was evaluated in office today.  Patient is currently stable.  Plan Schizoaffective disorder-stable Wellbutrin 300 mg p.o. daily Paxil 30 mg p.o. daily Seroquel XR to release 100 mg p.o. nightly  GAD-stable Paxil 30 mg p.o. daily Hydroxyzine 25 mg p.o. 3 times daily as needed  Borderline intellectual functioning-chronic-stable Patient advised to continue CBT as needed.  High risk medication use-reviewed and discussed labs-CMP-01/25/2023-creatinine 1.01, alkaline phosphatase-128-otherwise within normal limits-patient to follow up with primary care provider for abnormal labs, lipid panel-within normal limits. Hemoglobin A1c-08/16/2022-5.6 Patient will benefit from prolactin, TSH-patient provided lab slip.   Follow-up in clinic in 6 months or sooner if needed.   Collaboration of Care: Collaboration of Care: Referral or follow-up with counselor/therapist AEB encouraged to work with therapist.  Patient/Guardian was advised Release of Information must be obtained prior to any record release in order to collaborate their care with an outside provider. Patient/Guardian was advised if they have not already done so to contact the  registration department to sign all necessary forms in order for [place] to release information regarding their care.   Consent: Patient/Guardian gives verbal consent for treatment and assignment of benefits for services provided during this visit. Patient/Guardian expressed understanding and agreed to proceed.   This note was generated in part or whole with voice recognition software. Voice recognition is usually quite accurate but there are transcription errors that can and very often do occur. I apologize for any typographical errors that were not detected and corrected.    Jomarie Longs, MD 03/20/2023, 11:30 AM

## 2023-03-26 ENCOUNTER — Telehealth: Payer: Self-pay | Admitting: Family Medicine

## 2023-03-26 NOTE — Telephone Encounter (Addendum)
Pt wants a call back in regard to mammo results    Patient also wants to discuss high pulse rate 101.

## 2023-03-28 NOTE — Telephone Encounter (Signed)
LMTRC-KG 

## 2023-04-04 ENCOUNTER — Encounter: Payer: Self-pay | Admitting: Family Medicine

## 2023-04-04 ENCOUNTER — Ambulatory Visit (INDEPENDENT_AMBULATORY_CARE_PROVIDER_SITE_OTHER): Payer: Medicaid Other | Admitting: Family Medicine

## 2023-04-04 VITALS — BP 123/67 | HR 82 | Ht 65.0 in | Wt 267.0 lb

## 2023-04-04 DIAGNOSIS — M5431 Sciatica, right side: Secondary | ICD-10-CM

## 2023-04-04 DIAGNOSIS — E559 Vitamin D deficiency, unspecified: Secondary | ICD-10-CM

## 2023-04-04 DIAGNOSIS — F411 Generalized anxiety disorder: Secondary | ICD-10-CM

## 2023-04-04 DIAGNOSIS — I10 Essential (primary) hypertension: Secondary | ICD-10-CM

## 2023-04-04 DIAGNOSIS — E785 Hyperlipidemia, unspecified: Secondary | ICD-10-CM

## 2023-04-04 DIAGNOSIS — Z01419 Encounter for gynecological examination (general) (routine) without abnormal findings: Secondary | ICD-10-CM

## 2023-04-04 DIAGNOSIS — Z1231 Encounter for screening mammogram for malignant neoplasm of breast: Secondary | ICD-10-CM

## 2023-04-04 MED ORDER — OYSTER SHELL CALCIUM/D3 500-5 MG-MCG PO TABS: ORAL_TABLET | ORAL | 11 refills | Status: DC

## 2023-04-04 MED ORDER — PREDNISONE 10 MG PO TABS: 10.0000 mg | ORAL_TABLET | Freq: Two times a day (BID) | ORAL | 0 refills | Status: DC

## 2023-04-04 NOTE — Patient Instructions (Addendum)
Follow-up mid November, call if you need me sooner.  New for acute flareup of sciatic pain over the past 4 weeks is prednisone for 5 days.  Please work on weight loss to help with joint pain.  Blood pressure is excellent continue current medication.  Enjoy fruit  and vegetable and avoid sweets and starchy foods.  Fasting lipid CMP and EGFR and vitamin D level to be obtained 1 week before your next visit.  Please schedule mammogram at checkout.  You are referred to Dr. Ralph Dowdy for your Pap which is due in October.  Start daily calcium and vit D , take two tabs every morning at breakfast, sent to your pharmacy,  for bone health  It is important that you exercise regularly at least 30 minutes 5 times a week. If you develop chest pain, have severe difficulty breathing, or feel very tired, stop exercising immediately and seek medical attention  Thanks for choosing [facility], we consider it a privelige to serve you.

## 2023-04-05 ENCOUNTER — Other Ambulatory Visit: Payer: Self-pay

## 2023-04-05 ENCOUNTER — Telehealth: Payer: Self-pay | Admitting: Family Medicine

## 2023-04-05 DIAGNOSIS — M5431 Sciatica, right side: Secondary | ICD-10-CM | POA: Insufficient documentation

## 2023-04-05 MED ORDER — OYSTER SHELL CALCIUM/D3 500-5 MG-MCG PO TABS: ORAL_TABLET | ORAL | 11 refills | Status: AC

## 2023-04-05 NOTE — Assessment & Plan Note (Signed)
Hyperlipidemia:Low fat diet discussed and encouraged.   Lipid Panel  Lab Results  Component Value Date   CHOL 125 01/25/2023   HDL 41 01/25/2023   LDLCALC 62 01/25/2023   TRIG 126 01/25/2023   CHOLHDL 3.0 01/25/2023     Controlled, no change in medication Updated lab needed at/ before next visit.

## 2023-04-05 NOTE — Assessment & Plan Note (Signed)
Controlled, no change in medication DASH diet and commitment to daily physical activity for a minimum of 30 minutes discussed and encouraged, as a part of hypertension management. The importance of attaining a healthy weight is also discussed.     04/04/2023    8:36 AM 03/20/2023   10:41 AM 03/08/2023    8:05 AM 03/08/2023    8:02 AM 03/08/2023    6:49 AM 03/05/2023    9:59 AM 01/25/2023    9:25 AM  BP/Weight  Systolic BP 123  161 99 140  127  Diastolic BP 67  73 56 93  84  Wt. (Lbs) 267    267 267 265  BMI 44.43 kg/m2    44.43 kg/m2 44.43 kg/m2 44.1 kg/m2     Information is confidential and restricted. Go to Review Flowsheets to unlock data.

## 2023-04-05 NOTE — Assessment & Plan Note (Signed)
Acute flare , 5 day course of prednisone and weight looss recommended

## 2023-04-05 NOTE — Assessment & Plan Note (Signed)
Updated lab needed at/ before next visit.   

## 2023-04-05 NOTE — Assessment & Plan Note (Signed)
  Patient re-educated about  the importance of commitment to a  minimum of 150 minutes of exercise per week as able.  The importance of healthy food choices with portion control discussed, as well as eating regularly and within a 12 hour window most days. The need to choose "clean , green" food 50 to 75% of the time is discussed, as well as to make water the primary drink and set a goal of 64 ounces water daily.       04/04/2023    8:36 AM 03/20/2023   10:41 AM 03/08/2023    6:49 AM  Weight /BMI  Weight 267 lb  267 lb  Height 5\' 5"  (1.651 m)  5\' 5"  (1.651 m)  BMI 44.43 kg/m2  44.43 kg/m2     Information is confidential and restricted. Go to Review Flowsheets to unlock data.    unchanged

## 2023-04-05 NOTE — Telephone Encounter (Signed)
Prescription Request  04/05/2023  LOV: 04/04/2023  What is the name of the medication or equipment?   calcium-vitamin D (OSCAL WITH D) 500-5 MG-MCG tablet [[id number]]  **patient stated script not at pharmacy**  Have you contacted your pharmacy to request a refill? Yes   Which pharmacy would you like this sent to?  [id number] - [place], [place] - [address] AT [facility] [address] [place] [address] Phone: [phone] Fax: [phone]    Patient notified that their request is being sent to the clinical staff for review and that they should receive a response within 2 business days.   Please advise patient at [phone].

## 2023-04-05 NOTE — Telephone Encounter (Signed)
Refill sent.

## 2023-04-05 NOTE — Assessment & Plan Note (Signed)
Managed by psych and controlled 

## 2023-04-05 NOTE — Progress Notes (Signed)
Karen Cox     MRN: [id number]      DOB: 1961/03/10  Chief Complaint  Patient presents with   Follow-up    Follow up leg pain    HPI Karen Cox is here for follow up and re-evaluation of chronic medical conditions, medication management and review of any available recent lab and radiology data.  Preventive health is updated, specifically  Cancer screening and Immunization.   Questions or concerns regarding consultations or procedures which the PT has had in the interim are  addressed. The PT denies any adverse reactions to current medications since the last visit.   ROS Denies recent fever or chills. Denies sinus pressure, nasal congestion, ear pain or sore throat. Denies chest congestion, productive cough or wheezing. Denies chest pains, palpitations and leg swelling Denies abdominal pain, nausea, vomiting,diarrhea or constipation.   Denies dysuria, frequency, hesitancy or incontinence. 1 week h/o rigth leg pain, n o inciting trauma Denies headaches, seizures, numbness, or tingling. Denies depression, anxiety or insomnia. Denies skin break down or rash.   PE  BP 123/67 (BP Location: Right Arm, Patient Position: Sitting, Cuff Size: Large)   Pulse 82   Ht 5\' 5"  (1.651 m)   Wt 267 lb (121.1 kg)   LMP 06/27/2013   SpO2 98%   BMI 44.43 kg/m   Patient alert and oriented and in no cardiopulmonary distress.  HEENT: No facial asymmetry, EOMI,     Neck supple .  Chest: Clear to auscultation bilaterally.  CVS: S1, S2 no murmurs, no S3.Regular rate.  ABD: Soft non tender.   Ext: No edema  MS: decreased  ROM spine, dequate in shoulders, hips and knees.  Skin: Intact, no ulcerations or rash noted.  Psych: Good eye contact, normal affect. Memory intact not anxious or depressed appearing.  CNS: CN 2-12 intact, power,  normal throughout.no focal deficits noted.   Assessment & Plan  Vitamin D deficiency Updated lab needed at/ before next  visit.   Morbid obesity (HCC)  Patient re-educated about  the importance of commitment to a  minimum of 150 minutes of exercise per week as able.  The importance of healthy food choices with portion control discussed, as well as eating regularly and within a 12 hour window most days. The need to choose "clean , green" food 50 to 75% of the time is discussed, as well as to make water the primary drink and set a goal of 64 ounces water daily.       04/04/2023    8:36 AM 03/20/2023   10:41 AM 03/08/2023    6:49 AM  Weight /BMI  Weight 267 lb  267 lb  Height 5\' 5"  (1.651 m)  5\' 5"  (1.651 m)  BMI 44.43 kg/m2  44.43 kg/m2     Information is confidential and restricted. Go to Review Flowsheets to unlock data.    unchanged  Essential hypertension Controlled, no change in medication DASH diet and commitment to daily physical activity for a minimum of 30 minutes discussed and encouraged, as a part of hypertension management. The importance of attaining a healthy weight is also discussed.     04/04/2023    8:36 AM 03/20/2023   10:41 AM 03/08/2023    8:05 AM 03/08/2023    8:02 AM 03/08/2023    6:49 AM 03/05/2023    9:59 AM 01/25/2023    9:25 AM  BP/Weight  Systolic BP 123  409 99 140  127  Diastolic BP 67  73  56 93  84  Wt. (Lbs) 267    267 267 265  BMI 44.43 kg/m2    44.43 kg/m2 44.43 kg/m2 44.1 kg/m2     Information is confidential and restricted. Go to Review Flowsheets to unlock data.       Hyperlipidemia LDL goal <100 Hyperlipidemia:Low fat diet discussed and encouraged.   Lipid Panel  Lab Results  Component Value Date   CHOL 125 01/25/2023   HDL 41 01/25/2023   LDLCALC 62 01/25/2023   TRIG 126 01/25/2023   CHOLHDL 3.0 01/25/2023     Controlled, no change in medication Updated lab needed at/ before next visit.   Sciatica of right side Acute flare , 5 day course of prednisone and weight looss recommended  GAD (generalized anxiety disorder) Managed by psych and  controlled

## 2023-04-17 ENCOUNTER — Ambulatory Visit: Payer: MEDICAID | Admitting: Gastroenterology

## 2023-04-17 ENCOUNTER — Encounter: Payer: Self-pay | Admitting: Gastroenterology

## 2023-04-22 ENCOUNTER — Telehealth: Payer: Self-pay | Admitting: Family Medicine

## 2023-04-22 NOTE — Telephone Encounter (Signed)
Patient calling wishing to speak with a nurse regarding her arthritis, says she does not remember the type that she has and would like to discuss that. Pt also states she is running out of her Prednisone and she is needing a refill. Please advise

## 2023-04-23 NOTE — Telephone Encounter (Signed)
LMTRC-KG 

## 2023-04-24 ENCOUNTER — Institutional Professional Consult (permissible substitution): Payer: Medicaid Other | Admitting: Pulmonary Disease

## 2023-05-02 ENCOUNTER — Ambulatory Visit: Payer: MEDICAID | Admitting: Family Medicine

## 2023-05-07 ENCOUNTER — Encounter: Payer: Self-pay | Admitting: Family Medicine

## 2023-05-13 NOTE — Telephone Encounter (Signed)
Patient called office asking for another refill of prednisone  for her leg pain.  Pharmacy [facility] [place] [place]

## 2023-05-14 ENCOUNTER — Telehealth: Payer: Self-pay | Admitting: Family Medicine

## 2023-05-14 NOTE — Telephone Encounter (Signed)
Patient called asked is there a OTC cough medicine she can take, coughing a little. That will help her at night time. Call back # [phone].

## 2023-05-15 ENCOUNTER — Telehealth: Payer: Self-pay | Admitting: Family Medicine

## 2023-05-15 ENCOUNTER — Other Ambulatory Visit: Payer: Self-pay | Admitting: Family Medicine

## 2023-05-15 NOTE — Telephone Encounter (Signed)
Prescription Request  05/15/2023  LOV: 04/04/2023  What is the name of the medication or equipment? predniSONE (DELTASONE) 10 MG tablet   Have you contacted your pharmacy to request a refill? No   Which pharmacy would you like this sent to?  [id number] [place], [place] - [address] AT [facility] [address] [place] [address] Phone: [phone] Fax: [phone]    Patient notified that their request is being sent to the clinical staff for review and that they should receive a response within 2 business days.   Please advise at [facility] [phone]

## 2023-05-16 NOTE — Telephone Encounter (Signed)
Patient calling to follow up on note below wants to make sure she is still supposed to be taking Prednisone. Please advise Thank you

## 2023-05-17 ENCOUNTER — Other Ambulatory Visit: Payer: Self-pay | Admitting: Psychiatry

## 2023-05-17 DIAGNOSIS — F251 Schizoaffective disorder, depressive type: Secondary | ICD-10-CM

## 2023-05-17 DIAGNOSIS — F411 Generalized anxiety disorder: Secondary | ICD-10-CM

## 2023-05-17 NOTE — Telephone Encounter (Signed)
No vm

## 2023-05-17 NOTE — Telephone Encounter (Signed)
Awaiting dr simpson determination on refilling

## 2023-05-22 NOTE — Telephone Encounter (Signed)
Pt called in wants to see if provider can send in meds for cough. Wants a call back

## 2023-05-29 NOTE — Telephone Encounter (Signed)
No medication sent and no additional call made by patient

## 2023-07-12 ENCOUNTER — Institutional Professional Consult (permissible substitution): Payer: Medicaid Other | Admitting: Primary Care

## 2023-07-15 ENCOUNTER — Ambulatory Visit (HOSPITAL_COMMUNITY): Payer: MEDICAID

## 2023-07-23 ENCOUNTER — Telehealth: Payer: Self-pay | Admitting: Internal Medicine

## 2023-07-23 NOTE — Telephone Encounter (Signed)
Pt is asking for Linzess samples and said she needed a refill on her acid reflux medication. I told her that she needed to make a follow up OV from when she had her procedure back in MAY 2024 but she said to ask the nurse if she needed to come in. I offered her several times to make OV and she was a no show back in [place]. [facility] [place] [phone]

## 2023-07-24 ENCOUNTER — Other Ambulatory Visit: Payer: Self-pay | Admitting: Gastroenterology

## 2023-07-24 DIAGNOSIS — K219 Gastro-esophageal reflux disease without esophagitis: Secondary | ICD-10-CM

## 2023-07-24 MED ORDER — PANTOPRAZOLE SODIUM 40 MG PO TBEC
DELAYED_RELEASE_TABLET | ORAL | 3 refills | Status: AC
Start: 2023-07-24 — End: ?

## 2023-07-25 ENCOUNTER — Encounter: Payer: Self-pay | Admitting: *Deleted

## 2023-07-25 NOTE — Telephone Encounter (Signed)
LMOM for pt to call office. Also sent a MyChart message.

## 2023-07-31 ENCOUNTER — Ambulatory Visit (HOSPITAL_COMMUNITY)
Admission: RE | Admit: 2023-07-31 | Discharge: 2023-07-31 | Disposition: A | Discharge status: Home / Self Care | Payer: MEDICAID | Source: Ambulatory Visit | Attending: Family Medicine | Admitting: Family Medicine

## 2023-07-31 ENCOUNTER — Encounter (HOSPITAL_COMMUNITY): Payer: Self-pay

## 2023-07-31 DIAGNOSIS — Z1231 Encounter for screening mammogram for malignant neoplasm of breast: Secondary | ICD-10-CM | POA: Diagnosis present

## 2023-08-16 ENCOUNTER — Ambulatory Visit: Payer: Medicaid Other | Admitting: Family Medicine

## 2023-08-19 ENCOUNTER — Telehealth: Payer: Self-pay

## 2023-08-19 DIAGNOSIS — F411 Generalized anxiety disorder: Secondary | ICD-10-CM

## 2023-08-19 DIAGNOSIS — F251 Schizoaffective disorder, depressive type: Secondary | ICD-10-CM

## 2023-08-19 MED ORDER — QUETIAPINE FUMARATE ER 50 MG PO TB24
100.0000 mg | ORAL_TABLET | Freq: Every day | ORAL | 3 refills | Status: AC
Start: 2023-08-19 — End: ?

## 2023-08-19 MED ORDER — BUPROPION HCL ER (XL) 300 MG PO TB24
300.0000 mg | ORAL_TABLET | Freq: Every morning | ORAL | 3 refills | Status: AC
Start: 2023-08-19 — End: ?

## 2023-08-19 MED ORDER — PAROXETINE HCL 30 MG PO TABS
30.0000 mg | ORAL_TABLET | Freq: Every day | ORAL | 3 refills | Status: AC
Start: 2023-08-19 — End: ?

## 2023-08-19 NOTE — Telephone Encounter (Signed)
I have sent all medications requested to pharmacy.

## 2023-08-19 NOTE — Telephone Encounter (Signed)
tried to call pt to notifiy medication was sent to the pharmacy. mailbox was not set up so no message could be left.

## 2023-08-19 NOTE — Telephone Encounter (Signed)
received fax requesting a refill on the bupropion, paroxetine and the quetiapine. pt nwas last seen on 6-12 next appt 12-18

## 2023-08-23 ENCOUNTER — Telehealth: Payer: MEDICAID | Admitting: Psychiatry

## 2023-09-07 ENCOUNTER — Other Ambulatory Visit: Payer: Self-pay | Admitting: Family Medicine

## 2023-09-12 ENCOUNTER — Ambulatory Visit: Payer: Self-pay | Admitting: Family Medicine

## 2023-09-25 ENCOUNTER — Ambulatory Visit: Payer: MEDICAID | Admitting: Psychiatry

## 2023-09-30 ENCOUNTER — Telehealth: Payer: Self-pay

## 2023-09-30 NOTE — Telephone Encounter (Signed)
Copied from CRM [id number]. Topic: Clinical - Medication Question >> Sep 30, 2023  9:40 AM Karen Cox wrote: Reason for CRM: Patient is requesting to have a prescription sent to pharmacy for a Vitamin D for bones. Checked meds list & see she was on [place] with D-500-5 mg. Patient then states that Dr. Lodema Hong took her off of it but she needs it to make her bones stronger. Advised ppt that if provider has discontinued her from taking it, then she will need to consult with provider first. She's also wanting to ask a question about drinking Coors Light beer. Advised pt that she would need to speak with provider as well on that as it's not good to intake alcohol with medication. Please give pt a CB. CB #: [phone].

## 2023-09-30 NOTE — Telephone Encounter (Signed)
Agree with both rwesponses , will address vitD at next visit

## 2023-10-08 ENCOUNTER — Ambulatory Visit (INDEPENDENT_AMBULATORY_CARE_PROVIDER_SITE_OTHER): Payer: MEDICAID | Admitting: Family Medicine

## 2023-10-08 ENCOUNTER — Encounter: Payer: Self-pay | Admitting: Family Medicine

## 2023-10-08 VITALS — BP 135/82 | HR 91 | Ht 65.0 in | Wt 277.1 lb

## 2023-10-08 DIAGNOSIS — R002 Palpitations: Secondary | ICD-10-CM

## 2023-10-08 DIAGNOSIS — Z23 Encounter for immunization: Secondary | ICD-10-CM

## 2023-10-08 DIAGNOSIS — F39 Unspecified mood [affective] disorder: Secondary | ICD-10-CM

## 2023-10-08 DIAGNOSIS — K219 Gastro-esophageal reflux disease without esophagitis: Secondary | ICD-10-CM

## 2023-10-08 DIAGNOSIS — Z780 Asymptomatic menopausal state: Secondary | ICD-10-CM | POA: Diagnosis not present

## 2023-10-08 DIAGNOSIS — E785 Hyperlipidemia, unspecified: Secondary | ICD-10-CM | POA: Diagnosis not present

## 2023-10-08 DIAGNOSIS — E559 Vitamin D deficiency, unspecified: Secondary | ICD-10-CM

## 2023-10-08 MED ORDER — SEMAGLUTIDE-WEIGHT MANAGEMENT 0.5 MG/0.5ML ~~LOC~~ SOAJ: 0.5000 mg | SUBCUTANEOUS | 1 refills | Status: AC

## 2023-10-08 MED ORDER — SEMAGLUTIDE-WEIGHT MANAGEMENT 0.25 MG/0.5ML ~~LOC~~ SOAJ: 0.2500 mg | SUBCUTANEOUS | 0 refills | Status: AC

## 2023-10-08 NOTE — Patient Instructions (Addendum)
 F/u in 6 to 7 weeks, call if you need me sooner  New is once weekly wegovy  for weight loss  Please get labs already ordered today, Nurse please add testosterone  level  Flu vaccine today  Pain in right knee , leg , thigh is from arthritis, when you lose weight this will get less  I KNOW you will do  very well  Thanks for choosing [facility], we consider it a privelige to serve you.

## 2023-10-09 LAB — CMP14+EGFR
ALT: 22 [IU]/L (ref 0–32)
AST: 21 [IU]/L (ref 0–40)
Albumin: 4.4 g/dL (ref 3.9–4.9)
Alkaline Phosphatase: 130 [IU]/L — ABNORMAL HIGH (ref 44–121)
BUN/Creatinine Ratio: 10 — ABNORMAL LOW (ref 12–28)
BUN: 10 mg/dL (ref 8–27)
Bilirubin Total: 0.8 mg/dL (ref 0.0–1.2)
CO2: 20 mmol/L (ref 20–29)
Calcium: 10 mg/dL (ref 8.7–10.3)
Chloride: 103 mmol/L (ref 96–106)
Creatinine, Ser: 0.96 mg/dL (ref 0.57–1.00)
Globulin, Total: 2.8 g/dL (ref 1.5–4.5)
Glucose: 97 mg/dL (ref 70–99)
Potassium: 5.1 mmol/L (ref 3.5–5.2)
Sodium: 141 mmol/L (ref 134–144)
Total Protein: 7.2 g/dL (ref 6.0–8.5)
eGFR: 67 mL/min/{1.73_m2} (ref >59)

## 2023-10-09 LAB — LIPID PANEL
Chol/HDL Ratio: 4.2 {ratio} (ref 0.0–4.4)
Cholesterol, Total: 142 mg/dL (ref 100–199)
HDL: 34 mg/dL — ABNORMAL LOW (ref >39)
LDL Chol Calc (NIH): 77 mg/dL (ref 0–99)
Triglycerides: 182 mg/dL — ABNORMAL HIGH (ref 0–149)
VLDL Cholesterol Cal: 31 mg/dL (ref 5–40)

## 2023-10-09 LAB — VITAMIN D 25 HYDROXY (VIT D DEFICIENCY, FRACTURES): Vit D, 25-Hydroxy: 24.7 ng/mL — ABNORMAL LOW (ref 30.0–100.0)

## 2023-10-09 MED ORDER — VITAMIN D (ERGOCALCIFEROL) 1.25 MG (50000 UNIT) PO CAPS: 50000.0000 [IU] | ORAL_CAPSULE | ORAL | 8 refills | Status: AC

## 2023-10-10 DIAGNOSIS — Z23 Encounter for immunization: Secondary | ICD-10-CM | POA: Insufficient documentation

## 2023-10-10 NOTE — Assessment & Plan Note (Signed)
 Needs weekly supplement , this is started in 10/2023

## 2023-10-10 NOTE — Assessment & Plan Note (Signed)
Controlled, no change in medication Followed by GI 

## 2023-10-10 NOTE — Assessment & Plan Note (Signed)
 Hyperlipidemia:Low fat diet discussed and encouraged.   Lipid Panel  Lab Results  Component Value Date   CHOL 142 10/08/2023   HDL 34 (L) 10/08/2023   LDLCALC 77 10/08/2023   TRIG 182 (H) 10/08/2023   CHOLHDL 4.2 10/08/2023     Needs to increase exercise commitment and reduce fat in diet

## 2023-10-10 NOTE — Assessment & Plan Note (Signed)
 After obtaining informed consent, the influenza vaccine is  administered , with no adverse effect noted at the time of administration.

## 2023-10-10 NOTE — Assessment & Plan Note (Signed)
 Deteriorating, ongoing weight gain  Patient re-educated about  the importance of commitment to a  minimum of 150 minutes of exercise per week as able.  The importance of healthy food choices with portion control discussed, as well as eating regularly and within a 12 hour window most days. The need to choose clean , green food 50 to 75% of the time is discussed, as well as to make water  the primary drink and set a goal of 64 ounces water  daily.       10/08/2023    1:20 PM 04/04/2023    8:36 AM 03/20/2023   10:41 AM  Weight /BMI  Weight 277 lb 1.9 oz 267 lb   Height 5' 5 (1.651 m) 5' 5 (1.651 m)   BMI 46.12 kg/m2 44.43 kg/m2      Information is confidential and restricted. Go to Review Flowsheets to unlock data.    Start once weekly wegovy , f/u in 6 to 7 weeks

## 2023-10-10 NOTE — Assessment & Plan Note (Signed)
Managed by Psychiatry and stable 

## 2023-10-10 NOTE — Progress Notes (Signed)
 Karen Cox     MRN: [id number]      DOB: Oct 22, 1960  Chief Complaint  Patient presents with   Follow-up    Follow up    HPI Karen Cox is here for follow up and re-evaluation of chronic medical conditions, medication management and review of any available recent lab and radiology data.  Preventive health is updated, specifically  Cancer screening and Immunization.   Questions or concerns regarding consultations or procedures which the PT has had in the interim are  addressed. The PT denies any adverse reactions to current medications since the last visit.  C/o ongoing weight gain, wanted to know about  safey of OTC weight loss supplement, I advised as she is on prescription med aI am unable to address the safety  Good news is that wegovy  is now being covered by Medicaid so she should be able to start this  ROS Denies recent fever or chills. Denies sinus pressure, nasal congestion, ear pain or sore throat. Denies chest congestion, productive cough or wheezing. Denies chest pains, palpitations and leg swelling Denies abdominal pain, nausea, vomiting,diarrhea or constipation.   Denies dysuria, frequency, hesitancy or incontinence. Denies joint pain, swelling and limitation in mobility. Denies headaches, seizures, numbness, or tingling. Denies uncontrolled  depression, anxiety or insomnia. Denies skin break down or rash.   PE  BP 135/82 (BP Location: Right Arm, Patient Position: Sitting, Cuff Size: Large)   Pulse 91   Ht 5' 5 (1.651 m)   Wt 277 lb 1.9 oz (125.7 kg)   LMP 06/27/2013   SpO2 97%   BMI 46.12 kg/m   Patient alert and oriented and in no cardiopulmonary distress.  HEENT: No facial asymmetry, EOMI,     Neck supple .  Chest: Clear to auscultation bilaterally.  CVS: S1, S2 no murmurs, no S3.Regular rate.  ABD: Soft non tender.   Ext: No edema  MS: Adequate ROM spine, shoulders, hips and knees.  Skin: Intact, no ulcerations or rash  noted.  Psych: Good eye contact, normal affect. Memory intact not anxious or depressed appearing.  CNS: CN 2-12 intact, power,  normal throughout.no focal deficits noted.   Assessment & Plan  Vitamin D  deficiency Needs weekly supplement , this is started in 10/2023  Morbid obesity (HCC) Deteriorating, ongoing weight gain  Patient re-educated about  the importance of commitment to a  minimum of 150 minutes of exercise per week as able.  The importance of healthy food choices with portion control discussed, as well as eating regularly and within a 12 hour window most days. The need to choose clean , green food 50 to 75% of the time is discussed, as well as to make water  the primary drink and set a goal of 64 ounces water  daily.       10/08/2023    1:20 PM 04/04/2023    8:36 AM 03/20/2023   10:41 AM  Weight /BMI  Weight 277 lb 1.9 oz 267 lb   Height 5' 5 (1.651 m) 5' 5 (1.651 m)   BMI 46.12 kg/m2 44.43 kg/m2      Information is confidential and restricted. Go to Review Flowsheets to unlock data.    Start once weekly wegovy , f/u in 6 to 7 weeks  Episodic mood disorder (HCC) Managed by Psychiatry and stable  Encounter for immunization After obtaining informed consent, the influenza vaccine is  administered , with no adverse effect noted at the time of administration.   Palpitations Controlled, no change  in medication   GERD (gastroesophageal reflux disease) Controlled, no change in medication Followed by GI  Hyperlipidemia LDL goal <100 Hyperlipidemia:Low fat diet discussed and encouraged.   Lipid Panel  Lab Results  Component Value Date   CHOL 142 10/08/2023   HDL 34 (L) 10/08/2023   LDLCALC 77 10/08/2023   TRIG 182 (H) 10/08/2023   CHOLHDL 4.2 10/08/2023     Needs to increase exercise commitment and reduce fat in diet

## 2023-10-10 NOTE — Assessment & Plan Note (Signed)
 Controlled, no change in medication

## 2023-10-11 LAB — TESTOSTERONE,FREE AND TOTAL
Testosterone, Free: 2.2 pg/mL (ref 0.0–4.2)
Testosterone: 33 ng/dL (ref 3–67)

## 2023-10-30 ENCOUNTER — Institutional Professional Consult (permissible substitution): Payer: Medicaid Other | Admitting: Primary Care

## 2023-11-21 ENCOUNTER — Ambulatory Visit: Payer: Self-pay | Admitting: Psychiatry

## 2023-11-27 ENCOUNTER — Ambulatory Visit: Payer: MEDICAID | Admitting: Family Medicine

## 2023-12-20 ENCOUNTER — Ambulatory Visit: Payer: MEDICAID | Admitting: Family Medicine

## 2023-12-25 ENCOUNTER — Institutional Professional Consult (permissible substitution): Payer: MEDICAID | Admitting: Primary Care

## 2023-12-25 ENCOUNTER — Encounter: Payer: Self-pay | Admitting: Primary Care

## 2023-12-26 ENCOUNTER — Telehealth (INDEPENDENT_AMBULATORY_CARE_PROVIDER_SITE_OTHER): Payer: Self-pay | Admitting: Psychiatry

## 2023-12-26 DIAGNOSIS — F251 Schizoaffective disorder, depressive type: Secondary | ICD-10-CM

## 2023-12-26 NOTE — Progress Notes (Signed)
 Connected for the video appointment as scheduled however patient currently is out of the [place].  Hence unable to complete this telemedicine appointment.  Patient advised to call back to schedule another appointment once she is back in [place].

## 2024-03-10 ENCOUNTER — Other Ambulatory Visit: Payer: Self-pay | Admitting: Family Medicine

## 2024-03-12 NOTE — Telephone Encounter (Signed)
 Copied from CRM [id number]. Topic: General - Other >> Mar 12, 2024 11:38 AM Jenice Mitts wrote: Reason for CRM: Patient is calling because she does not have transportation right now. She is out of state looking after her mother and would like to know if her appointment on the 04/03/24 can be a virtual visit or what she would need to do because she does not wanna cancel her appointment  can call this number [phone]

## 2024-03-12 NOTE — Telephone Encounter (Signed)
 Changed to virtual and informed patient.

## 2024-04-03 ENCOUNTER — Ambulatory Visit: Payer: MEDICAID | Admitting: Family Medicine

## 2024-04-06 ENCOUNTER — Encounter: Payer: Self-pay | Admitting: Family Medicine

## 2024-04-15 ENCOUNTER — Other Ambulatory Visit: Payer: Self-pay | Admitting: Family Medicine

## 2024-04-15 DIAGNOSIS — I10 Essential (primary) hypertension: Secondary | ICD-10-CM

## 2024-05-28 ENCOUNTER — Telehealth: Payer: Self-pay | Admitting: Family Medicine

## 2024-05-28 NOTE — Telephone Encounter (Signed)
 LVM for pt if she is wanting all her records sent she will need to come in and sign a release form.

## 2024-05-28 NOTE — Telephone Encounter (Signed)
 Copied from CRM #[id number]. Topic: Medical Record Request - Records Request >> May 28, 2024 11:52 AM Wess RAMAN wrote: Reason for CRM: Patient would like her medical records sent to the [place]  medicaid office   Phone #: [phone] Fax #: [phone] [address] [place], [place] [address]

## 2024-07-29 NOTE — Telephone Encounter (Signed)
 Sent RX for Diflucan  to pt preferred pharmacy - [facility] on [address]

## 2024-08-13 NOTE — Telephone Encounter (Signed)
 S/w pt  Pt has f/u appt scheduled to speak about HRT further with Dr Girard on 11/21.  Pt would like to discuss Estring  being replaced and continuing provera as she was on both for 2 years with no issues

## 2024-08-14 NOTE — Progress Notes (Signed)
  Subjective Patient ID: Karen Cox is a 63 y.o. female.  No chief complaint on file.   The following information was reviewed by members of the visit team:     This visit is being conducted via virtual visit platform. Patient identification verified: Yes Provider licensed to provide medical care to patient: Yes Patient location: [facility] Provider location: Clinic/hospital PCP: Dr.Inna Jenine Encounter took place via 2-way audio and visual technology Video start time: 1302     video stop time: [id number]   HPI 63 year old female presents via virtual visit platform for allergy referral.  States she would like a referral to an allergist due to her environmental allergies. She was previously following with an allergist in [place]  and would like to follow up with one here. She is currently yon Singulair  and flonase  for environmental allergies. She would like allergy testing to see what she is allergic to. Symptoms include sneezing, cough, watery eyes, nasal congestion. Denies SOB.   Review of Systems 10 point ROS is negative unless otherwise noted above in the HPI.   Objective Physical Exam Constitutional: Well-developed. Patient is pleasant to speak with and is in no acute distress at this time.  Eye: Conjunctiva appears normal. Head: Normocephalic, nontraumatic ENMT: Hearing appears to be intact.  Respiratory: Respirations are non-labored, normal respiratory effort.  Musculoskeletal/Extremities: No gross deformities Integumentary: Visible skin is clean dry and intact. No rashes noted. Neurologic: Alert, Oriented. No focal neurological abnormalities noted. Psychiatric: Cooperative, appropriate affect.    Assessment/Plan Diagnoses and all orders for this visit:  Environmental allergies -     levocetirizine (Xyzal) 5 mg tablet; Take 1 tablet (5 mg total) by mouth every evening.  Encounter for allergy testing -     Ambulatory referral to Allergy; Future  Continue with  Singulair  and flonase  and add Xyzal.   Referral to allergy placed.  Electronically signed: Russ Jenine, DO 08/14/2024  1:03 PM

## 2024-08-19 ENCOUNTER — Other Ambulatory Visit: Payer: Self-pay | Admitting: Psychiatry

## 2024-08-19 DIAGNOSIS — F411 Generalized anxiety disorder: Secondary | ICD-10-CM

## 2024-08-19 DIAGNOSIS — F251 Schizoaffective disorder, depressive type: Secondary | ICD-10-CM

## 2024-08-29 ENCOUNTER — Other Ambulatory Visit: Payer: Self-pay | Admitting: Psychiatry

## 2024-08-29 DIAGNOSIS — F411 Generalized anxiety disorder: Secondary | ICD-10-CM

## 2024-08-29 DIAGNOSIS — F251 Schizoaffective disorder, depressive type: Secondary | ICD-10-CM
